# Patient Record
Sex: Male | Born: 1950
Health system: Southern US, Community
[De-identification: ages and names within clinical notes are randomized; demographics above are authoritative.]

## PROBLEM LIST (undated history)

## (undated) ENCOUNTER — Encounter

## (undated) ENCOUNTER — Ambulatory Visit

## (undated) ENCOUNTER — Telehealth

## (undated) ENCOUNTER — Encounter: Attending: Gastroenterology | Primary: Gastroenterology

## (undated) ENCOUNTER — Ambulatory Visit: Payer: MEDICARE | Attending: Gastroenterology | Primary: Gastroenterology

## (undated) ENCOUNTER — Ambulatory Visit: Attending: Pharmacist | Primary: Pharmacist

## (undated) ENCOUNTER — Ambulatory Visit: Payer: Medicare (Managed Care)

## (undated) ENCOUNTER — Ambulatory Visit: Payer: MEDICARE | Attending: Surgery | Primary: Surgery

## (undated) ENCOUNTER — Ambulatory Visit
Payer: Medicare (Managed Care) | Attending: Student in an Organized Health Care Education/Training Program | Primary: Student in an Organized Health Care Education/Training Program

## (undated) ENCOUNTER — Encounter
Attending: Student in an Organized Health Care Education/Training Program | Primary: Student in an Organized Health Care Education/Training Program

## (undated) ENCOUNTER — Ambulatory Visit: Payer: MEDICARE

## (undated) ENCOUNTER — Ambulatory Visit: Attending: Medical | Primary: Medical

## (undated) DIAGNOSIS — I358 Other nonrheumatic aortic valve disorders: Secondary | ICD-10-CM

## (undated) DIAGNOSIS — I10 Essential (primary) hypertension: Secondary | ICD-10-CM

## (undated) DIAGNOSIS — R0600 Dyspnea, unspecified: Secondary | ICD-10-CM

## (undated) DIAGNOSIS — K5190 Ulcerative colitis, unspecified, without complications: Secondary | ICD-10-CM

## (undated) DIAGNOSIS — R0989 Other specified symptoms and signs involving the circulatory and respiratory systems: Secondary | ICD-10-CM

## (undated) DIAGNOSIS — L4050 Arthropathic psoriasis, unspecified: Secondary | ICD-10-CM

## (undated) DIAGNOSIS — E119 Type 2 diabetes mellitus without complications: Secondary | ICD-10-CM

## (undated) HISTORY — DX: Arthropathic psoriasis, unspecified: L40.50

## (undated) HISTORY — DX: Other specified symptoms and signs involving the circulatory and respiratory systems: R09.89

## (undated) HISTORY — DX: Essential (primary) hypertension: I10

## (undated) HISTORY — DX: Other nonrheumatic aortic valve disorders: I35.8

## (undated) HISTORY — DX: Type 2 diabetes mellitus without complications: E11.9

## (undated) HISTORY — DX: Dyspnea, unspecified: R06.00

## (undated) HISTORY — DX: Ulcerative colitis, unspecified, without complications: K51.90

## (undated) HISTORY — PX: KNEE ARTHROSCOPY: SUR90

## (undated) HISTORY — PX: SHOULDER ARTHROSCOPY: SHX128

## (undated) MED ORDER — CLONAZEPAM 0.5 MG TABLET: Freq: Two times a day (BID) | ORAL | 0 days | PRN

## (undated) MED ORDER — METHOTREXATE SODIUM 25 MG/ML INJECTION SOLUTION: 25 mg | mL | 0 refills | 84 days

## (undated) MED ORDER — PRESERVISION AREDS ORAL: ORAL | 0.00000 days

## (undated) MED ORDER — SIMPONI 100 MG/ML SUBCUTANEOUS PEN INJECTOR: 100 mg | mL | 11 refills | 28 days

## (undated) MED ORDER — UNABLE TO FIND: Freq: Every day | 0.00000 days

## (undated) MED ORDER — AMLODIPINE 10 MG TABLET: Freq: Every day | 0 days

## (undated) MED ORDER — TRAMADOL 50 MG TABLET: Freq: Four times a day (QID) | ORAL | 0 days | PRN

---

## 1990-05-11 HISTORY — PX: MELANOMA EXCISION: SHX5266

## 2010-06-01 ENCOUNTER — Encounter: Payer: Self-pay | Admitting: Orthopedic Surgery

## 2013-07-21 DIAGNOSIS — J3489 Other specified disorders of nose and nasal sinuses: Secondary | ICD-10-CM | POA: Diagnosis not present

## 2013-07-21 DIAGNOSIS — L408 Other psoriasis: Secondary | ICD-10-CM | POA: Diagnosis not present

## 2013-07-21 DIAGNOSIS — I10 Essential (primary) hypertension: Secondary | ICD-10-CM | POA: Diagnosis not present

## 2013-07-21 DIAGNOSIS — E785 Hyperlipidemia, unspecified: Secondary | ICD-10-CM | POA: Diagnosis not present

## 2013-07-21 DIAGNOSIS — E119 Type 2 diabetes mellitus without complications: Secondary | ICD-10-CM | POA: Diagnosis not present

## 2013-07-21 DIAGNOSIS — M25519 Pain in unspecified shoulder: Secondary | ICD-10-CM | POA: Diagnosis not present

## 2013-10-12 DIAGNOSIS — L4050 Arthropathic psoriasis, unspecified: Secondary | ICD-10-CM | POA: Diagnosis not present

## 2013-10-17 DIAGNOSIS — L4050 Arthropathic psoriasis, unspecified: Secondary | ICD-10-CM | POA: Diagnosis not present

## 2013-11-01 DIAGNOSIS — L4050 Arthropathic psoriasis, unspecified: Secondary | ICD-10-CM | POA: Diagnosis not present

## 2013-11-20 DIAGNOSIS — J329 Chronic sinusitis, unspecified: Secondary | ICD-10-CM | POA: Diagnosis not present

## 2013-11-27 DIAGNOSIS — L4050 Arthropathic psoriasis, unspecified: Secondary | ICD-10-CM | POA: Diagnosis not present

## 2014-01-11 DIAGNOSIS — L4050 Arthropathic psoriasis, unspecified: Secondary | ICD-10-CM | POA: Diagnosis not present

## 2014-04-18 DIAGNOSIS — L409 Psoriasis, unspecified: Secondary | ICD-10-CM | POA: Diagnosis not present

## 2014-04-18 DIAGNOSIS — L4059 Other psoriatic arthropathy: Secondary | ICD-10-CM | POA: Diagnosis not present

## 2014-07-16 DIAGNOSIS — L4059 Other psoriatic arthropathy: Secondary | ICD-10-CM | POA: Diagnosis not present

## 2014-07-16 DIAGNOSIS — L409 Psoriasis, unspecified: Secondary | ICD-10-CM | POA: Diagnosis not present

## 2015-02-27 DIAGNOSIS — M25562 Pain in left knee: Secondary | ICD-10-CM | POA: Diagnosis not present

## 2015-02-27 DIAGNOSIS — L4059 Other psoriatic arthropathy: Secondary | ICD-10-CM | POA: Diagnosis not present

## 2015-02-27 DIAGNOSIS — Z79899 Other long term (current) drug therapy: Secondary | ICD-10-CM | POA: Diagnosis not present

## 2015-02-27 DIAGNOSIS — L409 Psoriasis, unspecified: Secondary | ICD-10-CM | POA: Diagnosis not present

## 2015-03-11 DIAGNOSIS — L4050 Arthropathic psoriasis, unspecified: Secondary | ICD-10-CM | POA: Diagnosis not present

## 2015-03-11 DIAGNOSIS — M12842 Other specific arthropathies, not elsewhere classified, left hand: Secondary | ICD-10-CM | POA: Diagnosis not present

## 2015-03-11 DIAGNOSIS — M12841 Other specific arthropathies, not elsewhere classified, right hand: Secondary | ICD-10-CM | POA: Diagnosis not present

## 2015-03-11 DIAGNOSIS — L4059 Other psoriatic arthropathy: Secondary | ICD-10-CM | POA: Diagnosis not present

## 2015-03-11 DIAGNOSIS — M25562 Pain in left knee: Secondary | ICD-10-CM | POA: Diagnosis not present

## 2015-03-11 DIAGNOSIS — M549 Dorsalgia, unspecified: Secondary | ICD-10-CM | POA: Diagnosis not present

## 2015-03-11 DIAGNOSIS — Z79899 Other long term (current) drug therapy: Secondary | ICD-10-CM | POA: Diagnosis not present

## 2015-03-11 DIAGNOSIS — L409 Psoriasis, unspecified: Secondary | ICD-10-CM | POA: Diagnosis not present

## 2015-05-23 DIAGNOSIS — L409 Psoriasis, unspecified: Secondary | ICD-10-CM | POA: Diagnosis not present

## 2015-05-23 DIAGNOSIS — M170 Bilateral primary osteoarthritis of knee: Secondary | ICD-10-CM | POA: Diagnosis not present

## 2015-05-23 DIAGNOSIS — L4059 Other psoriatic arthropathy: Secondary | ICD-10-CM | POA: Diagnosis not present

## 2015-05-23 DIAGNOSIS — M25561 Pain in right knee: Secondary | ICD-10-CM | POA: Diagnosis not present

## 2015-06-10 DIAGNOSIS — M25551 Pain in right hip: Secondary | ICD-10-CM | POA: Diagnosis not present

## 2015-06-10 DIAGNOSIS — L409 Psoriasis, unspecified: Secondary | ICD-10-CM | POA: Diagnosis not present

## 2015-06-10 DIAGNOSIS — L4059 Other psoriatic arthropathy: Secondary | ICD-10-CM | POA: Diagnosis not present

## 2015-07-31 DIAGNOSIS — M1712 Unilateral primary osteoarthritis, left knee: Secondary | ICD-10-CM | POA: Diagnosis not present

## 2015-07-31 DIAGNOSIS — L4059 Other psoriatic arthropathy: Secondary | ICD-10-CM | POA: Diagnosis not present

## 2015-07-31 DIAGNOSIS — L409 Psoriasis, unspecified: Secondary | ICD-10-CM | POA: Diagnosis not present

## 2015-07-31 DIAGNOSIS — M25562 Pain in left knee: Secondary | ICD-10-CM | POA: Diagnosis not present

## 2015-08-18 DIAGNOSIS — N492 Inflammatory disorders of scrotum: Secondary | ICD-10-CM | POA: Diagnosis not present

## 2015-08-21 DIAGNOSIS — N492 Inflammatory disorders of scrotum: Secondary | ICD-10-CM | POA: Diagnosis not present

## 2015-08-21 DIAGNOSIS — L0390 Cellulitis, unspecified: Secondary | ICD-10-CM | POA: Diagnosis not present

## 2015-08-22 DIAGNOSIS — L4059 Other psoriatic arthropathy: Secondary | ICD-10-CM | POA: Diagnosis not present

## 2015-08-22 DIAGNOSIS — M1712 Unilateral primary osteoarthritis, left knee: Secondary | ICD-10-CM | POA: Diagnosis not present

## 2015-08-22 DIAGNOSIS — L409 Psoriasis, unspecified: Secondary | ICD-10-CM | POA: Diagnosis not present

## 2015-08-22 DIAGNOSIS — M25562 Pain in left knee: Secondary | ICD-10-CM | POA: Diagnosis not present

## 2015-10-30 DIAGNOSIS — L409 Psoriasis, unspecified: Secondary | ICD-10-CM | POA: Diagnosis not present

## 2015-10-30 DIAGNOSIS — Z79899 Other long term (current) drug therapy: Secondary | ICD-10-CM | POA: Diagnosis not present

## 2015-10-30 DIAGNOSIS — L4059 Other psoriatic arthropathy: Secondary | ICD-10-CM | POA: Diagnosis not present

## 2015-10-30 DIAGNOSIS — M1712 Unilateral primary osteoarthritis, left knee: Secondary | ICD-10-CM | POA: Diagnosis not present

## 2015-10-30 DIAGNOSIS — M25562 Pain in left knee: Secondary | ICD-10-CM | POA: Diagnosis not present

## 2015-11-07 ENCOUNTER — Encounter: Payer: Self-pay | Admitting: Hematology

## 2015-11-07 ENCOUNTER — Telehealth: Payer: Self-pay | Admitting: Hematology

## 2015-11-07 NOTE — Telephone Encounter (Signed)
Appointment scheduled with Dr. Irene Limbo on 7/11 at 11:00am. Aware to arrive 30 minutes early. Demographics verified and location given. Letter to patient and the referring.

## 2015-11-19 ENCOUNTER — Encounter: Payer: Self-pay | Admitting: Hematology

## 2015-11-19 ENCOUNTER — Ambulatory Visit (HOSPITAL_BASED_OUTPATIENT_CLINIC_OR_DEPARTMENT_OTHER): Payer: Medicare Other

## 2015-11-19 ENCOUNTER — Ambulatory Visit (HOSPITAL_BASED_OUTPATIENT_CLINIC_OR_DEPARTMENT_OTHER): Payer: Medicare Other | Admitting: Hematology

## 2015-11-19 VITALS — BP 160/90 | HR 86 | Temp 98.4°F | Resp 19 | Ht 67.0 in | Wt 217.7 lb

## 2015-11-19 DIAGNOSIS — D892 Hypergammaglobulinemia, unspecified: Secondary | ICD-10-CM

## 2015-11-19 DIAGNOSIS — L4050 Arthropathic psoriasis, unspecified: Secondary | ICD-10-CM | POA: Insufficient documentation

## 2015-11-19 DIAGNOSIS — L409 Psoriasis, unspecified: Secondary | ICD-10-CM | POA: Insufficient documentation

## 2015-11-19 DIAGNOSIS — D45 Polycythemia vera: Secondary | ICD-10-CM | POA: Diagnosis not present

## 2015-11-19 LAB — COMPREHENSIVE METABOLIC PANEL
ALT: 39 U/L (ref 0–55)
ANION GAP: 11 meq/L (ref 3–11)
AST: 29 U/L (ref 5–34)
Albumin: 3.7 g/dL (ref 3.5–5.0)
Alkaline Phosphatase: 67 U/L (ref 40–150)
BILIRUBIN TOTAL: 0.48 mg/dL (ref 0.20–1.20)
BUN: 10.3 mg/dL (ref 7.0–26.0)
CO2: 23 meq/L (ref 22–29)
Calcium: 9.4 mg/dL (ref 8.4–10.4)
Chloride: 103 mEq/L (ref 98–109)
Creatinine: 0.8 mg/dL (ref 0.7–1.3)
Glucose: 202 mg/dl — ABNORMAL HIGH (ref 70–140)
POTASSIUM: 3.8 meq/L (ref 3.5–5.1)
Sodium: 137 mEq/L (ref 136–145)
TOTAL PROTEIN: 8 g/dL (ref 6.4–8.3)

## 2015-11-19 LAB — CBC & DIFF AND RETIC
BASO%: 0.5 % (ref 0.0–2.0)
Basophils Absolute: 0.1 10*3/uL (ref 0.0–0.1)
EOS%: 3.5 % (ref 0.0–7.0)
Eosinophils Absolute: 0.3 10*3/uL (ref 0.0–0.5)
HCT: 44.7 % (ref 38.4–49.9)
HGB: 15.8 g/dL (ref 13.0–17.1)
IMMATURE RETIC FRACT: 5 % (ref 3.00–10.60)
LYMPH%: 20.3 % (ref 14.0–49.0)
MCH: 28.9 pg (ref 27.2–33.4)
MCHC: 35.3 g/dL (ref 32.0–36.0)
MCV: 81.9 fL (ref 79.3–98.0)
MONO#: 0.9 10*3/uL (ref 0.1–0.9)
MONO%: 9.9 % (ref 0.0–14.0)
NEUT%: 65.8 % (ref 39.0–75.0)
NEUTROS ABS: 6 10*3/uL (ref 1.5–6.5)
NRBC: 0 % (ref 0–0)
PLATELETS: 189 10*3/uL (ref 140–400)
RBC: 5.46 10*6/uL (ref 4.20–5.82)
RDW: 13.5 % (ref 11.0–14.6)
Retic %: 1.28 % (ref 0.80–1.80)
Retic Ct Abs: 69.89 10*3/uL (ref 34.80–93.90)
WBC: 9.2 10*3/uL (ref 4.0–10.3)
lymph#: 1.9 10*3/uL (ref 0.9–3.3)

## 2015-11-19 NOTE — Progress Notes (Signed)
Benjamin Chapman Kitchen    HEMATOLOGY/ONCOLOGY CONSULTATION NOTE  Date of Service: 11/19/2015  PCP: Dr Benjamin Chapman (Benjamin Chapman)  CHIEF COMPLAINTS/PURPOSE OF CONSULTATION:  Elevated red blood cells  HISTORY OF PRESENTING ILLNESS:   Benjamin Chapman is a wonderful 65 y.o. male who has been referred to [place] by Dr .Benjamin Chapman ([place]) for evaluation and management of elevated red blood cells.  Patient has a history of long-standing psoriasis with psoriatic arthritis, diabetes type 2, remote history of melanoma, clinical concern for sleep apnea, obesity who sees Dr. Dossie Chapman for management of his psoriatic polyarthritis. he reports that he has been on Remicade and then Humira and then [place] and now is on Leflunomide his sedimentation rate was noted to be within normal limits recently. She was noted on recent labs with his rheumatologist on 10/31/2015 to have a hemoglobin of 17 and a hematocrit of 50.3 with a normal WBC count of 10.1k and normal platelet count of 271k. Comments metabolic panel on the same day showed elevated blood sugar of 222. His CBC on 03/10/2016 prior to his recent labs showed hemoglobin of 16 with hematocrit of 47.6 normal platelet count and close to normal WBC count Patient reports that he has never had this before. No previous history of venous thromboembolism. No fevers no chills no night sweats no weight loss. No significant new fatigue. No abdominal pain or distention.    MEDICAL HISTORY:   #1 long-standing psoriasis with psoriatic arthropathy for 10 years #2 diabetes type 2-diet-controlled #3 obesity #4 clinical concern for sleep apnea #5 history of melanoma on the back 25 years ago status post surgical resection #6 bilateral biceps tendon rupture surgery  SURGICAL HISTORY: Bilateral bicipital tendon rupture surgery  SOCIAL HISTORY: Social History   Social History  . Marital Status: Married    Spouse Name: N/A  . Number of Children: N/A    . Years of Education: N/A   Occupational History  . Not on file.   Social History Main Topics  . Smoking status: Not on file  . Smokeless tobacco: Not on file  . Alcohol Use: Not on file  . Drug Use: Not on file  . Sexual Activity: Not on file   Other Topics Concern  . Not on file   Social History Narrative  . No narrative on file   Ex-smoker and smoked 1 pack per day starting at age 72 years quit 20 years ago. Okays no social alcohol use. 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  FAMILY HISTORY:  -Father diseased at age 66 years from drowning when the patient was about 72 months old. -Mother history of CVA  ALLERGIES:  is allergic to codeine and remicade [infliximab].  MEDICATIONS:  Current Outpatient Prescriptions  Medication Sig Dispense Refill  . aspirin 81 MG tablet Take 81 mg by mouth daily.    Benjamin Chapman Kitchen ibuprofen (ADVIL,MOTRIN) 200 MG tablet Take 200 mg by mouth every 6 (six) hours as needed.     No current facility-administered medications for this visit.   Leflunomide 10 mg by mouth daily started recently  REVIEW OF SYSTEMS:    10 Point review of Systems was done is negative except as noted above.  PHYSICAL EXAMINATION: ECOG PERFORMANCE STATUS: 1 - Symptomatic but completely ambulatory  . Filed Vitals:   11/19/15 1043 11/19/15 1047  BP: 164/91 160/90  Pulse: 86   Temp: 98.4 F (36.9 C)   Resp: 19    Filed Weights  11/19/15 1043  Weight: 217 lb 11.2 oz (98.748 kg)   .Body mass index is 34.09 kg/(m^2).  GENERAL:alert, in no acute distress and comfortable SKIN: chronic psoariasis, no acute rashes. EYES: normal, conjunctiva are pink and non-injected, sclera clear OROPHARYNX:no exudate, no erythema and lips, buccal mucosa, and tongue normal  NECK: supple, no JVD, thyroid normal size, non-tender, without nodularity LYMPH:  no palpable lymphadenopathy in the cervical, axillary or inguinal LUNGS:  clear to auscultation with normal respiratory effort HEART: regular rate & rhythm,  no murmurs and no lower extremity edema ABDOMEN: abdomen soft, non-tender, normoactive bowel sounds , no palpable hepatosplenomegaly Musculoskeletal: no cyanosis of digits and no clubbing  PSYCH: alert & oriented x 3 with fluent speech NEURO: no focal motor/sensory deficits  LABORATORY DATA:  I have reviewed the data as listed  Outside labs from Dr. Audelia Hives office  CBC shows a hemoglobin of 17 hematocrit of 50.3 RBC count 10.1k platelets of 271k CMP showed sodium 133, potassium 4.5, chloride 97, bicarbonate 27, BUN 11, creatinine 0.8, glucose of 222, total protein 8.4, albumin 4 normal LFTs. Sedimentation rate of 9  RADIOGRAPHIC STUDIES: I have personally reviewed the radiological images as listed and agreed with the findings in the report. No results found.  ASSESSMENT & PLAN:   65 year old  male with  #1 Mild polycythemia noted by his rheumatologist on labs on 10/31/2015. Hemoglobin was noted to be 17 with hematocrit of 50.3. No associated thrombocytosis or leukocytosis. Patient has no palpable hepatosplenomegaly. This is more likely to be a secondary polycythemia possibly related to hemoconcentration from osmotic diuresis related to uncontrolled diabetes. He also has clinical symptomatology suggestive of possible sleep apnea in the setting of obesity. Ex-smoker but has quit smoking 20 years ago. Not on diuretics at this time. No previous history of venous thromboembolism. Plan -We shall recheck his CBC with differential and CMP today -We shall rule out clonal erythropoiesis by getting a Jak 2 V617F and Jak2  Exon 12 studies. -If it is clonal erythropoiesis genetic markers are negative that would pretty much rule out polycythemia vera and the patient would need to follow-up with his primary care physician to evaluate other etiologies of secondary polycythemia . Would need to have his diabetes under  better control avoid osmotic diuresis with hemoconcentration .  -Would recommend getting a sleep study with his primary care physician to rule out obstructive sleep apnea .  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 Irene Limbo   All of the patients questions were answered with apparent satisfaction. The patient knows to call the clinic with any problems, questions or concerns.  I spent 45 minutes counseling the patient face to face. The total time spent in the appointment was 60 minutes and more than 50% was on counseling and direct patient cares.    Sullivan Lone MD [place] AAHIVMS [facility] Hematology/Oncology Physician [facility]  (Office):       [phone] (Work cell):  [phone] (Fax):           [phone]  11/19/2015 11:28 AM

## 2015-11-21 LAB — MULTIPLE MYELOMA PANEL, SERUM
ALBUMIN/GLOB SERPL: 1 (ref 0.7–1.7)
ALPHA 1: 0.3 g/dL (ref 0.0–0.4)
ALPHA2 GLOB SERPL ELPH-MCNC: 0.9 g/dL (ref 0.4–1.0)
Albumin SerPl Elph-Mcnc: 3.6 g/dL (ref 2.9–4.4)
B-Globulin SerPl Elph-Mcnc: 1.4 g/dL — ABNORMAL HIGH (ref 0.7–1.3)
GAMMA GLOB SERPL ELPH-MCNC: 1.2 g/dL (ref 0.4–1.8)
GLOBULIN, TOTAL: 3.8 g/dL (ref 2.2–3.9)
IgA, Qn, Serum: 416 mg/dL (ref 61–437)
IgM, Qn, Serum: 129 mg/dL (ref 20–172)
Total Protein: 7.4 g/dL (ref 6.0–8.5)

## 2015-12-04 ENCOUNTER — Telehealth: Payer: Self-pay | Admitting: Hematology

## 2015-12-04 NOTE — Telephone Encounter (Signed)
Called patient to confirm appointment. Left message. Appointment letter and schedule mailed. Maria F. °

## 2015-12-23 ENCOUNTER — Other Ambulatory Visit: Payer: Medicare Other

## 2015-12-23 ENCOUNTER — Ambulatory Visit: Payer: Medicare Other | Admitting: Hematology

## 2015-12-31 ENCOUNTER — Telehealth: Payer: Self-pay | Admitting: Hematology

## 2015-12-31 NOTE — Telephone Encounter (Signed)
DR Irene Limbo COVERING AP 8/23. PER INF MGR RESCHEDULED F/U. SPOKE WITH [name] LAB/FU CHANGE FROM 8/23 TO 8/28 @ 3 PM.

## 2016-01-01 ENCOUNTER — Ambulatory Visit: Payer: Medicare Other | Admitting: Hematology

## 2016-01-01 ENCOUNTER — Other Ambulatory Visit: Payer: Medicare Other

## 2016-01-06 ENCOUNTER — Other Ambulatory Visit: Payer: Medicare Other

## 2016-01-06 ENCOUNTER — Encounter: Payer: Self-pay | Admitting: Hematology

## 2016-01-06 ENCOUNTER — Ambulatory Visit (HOSPITAL_BASED_OUTPATIENT_CLINIC_OR_DEPARTMENT_OTHER): Payer: Medicare Other | Admitting: Hematology

## 2016-01-06 VITALS — BP 176/84 | HR 65 | Temp 98.8°F | Resp 18 | Wt 214.3 lb

## 2016-01-06 DIAGNOSIS — D892 Hypergammaglobulinemia, unspecified: Secondary | ICD-10-CM | POA: Diagnosis not present

## 2016-01-06 DIAGNOSIS — D751 Secondary polycythemia: Secondary | ICD-10-CM | POA: Diagnosis not present

## 2016-02-10 NOTE — Progress Notes (Signed)
[name] Kitchen    HEMATOLOGY/ONCOLOGY CLINIC NOTE  Date of Service: .01/06/2016  PCP: Dr Syed-rheumatology ([place])  CHIEF COMPLAINTS/PURPOSE OF CONSULTATION:  Elevated red blood cells  HISTORY OF PRESENTING ILLNESS:   Benjamin Chapman is a wonderful 65 y.o. male who has been referred to [place] by Dr .Dossie Der ([place]) for evaluation and management of elevated red blood cells.  Patient has a history of long-standing psoriasis with psoriatic arthritis, diabetes type 2, remote history of melanoma, clinical concern for sleep apnea, obesity who sees Dr. Dossie Der for management of his psoriatic polyarthritis. he reports that he has been on Remicade and then Humira and then [place] and now is on Leflunomide his sedimentation rate was noted to be within normal limits recently. She was noted on recent labs with his rheumatologist on 10/31/2015 to have a hemoglobin of 17 and a hematocrit of 50.3 with a normal WBC count of 10.1k and normal platelet count of 271k. Comments metabolic panel on the same day showed elevated blood sugar of 222. His CBC on 03/10/2016 prior to his recent labs showed hemoglobin of 16 with hematocrit of 47.6 normal platelet count and close to normal WBC count Patient reports that he has never had this before. No previous history of venous thromboembolism. No fevers no chills no night sweats no weight loss. No significant new fatigue. No abdominal pain or distention.   INTERVAL HISTORY  Benjamin Chapman is here for followup of his lab results as a part of workup for polycythemia and hypergammaglobulinemia. He notes no acute new symptoms.   MEDICAL HISTORY:   #1 long-standing psoriasis with psoriatic arthropathy for 10 years #2 diabetes type 2-diet-controlled #3 obesity #4 clinical concern for sleep apnea #5 history of melanoma on the back 25 years ago status post surgical resection #6 bilateral biceps tendon rupture surgery  SURGICAL  HISTORY: Bilateral bicipital tendon rupture surgery  SOCIAL HISTORY: Social History   Social History  . Marital status: Married    Spouse name: N/A  . Number of children: N/A  . Years of education: N/A   Occupational History  . Not on file.   Social History Main Topics  . Smoking status: Former Research scientist (life sciences)  . Smokeless tobacco: Never Used  . Alcohol use Yes     Comment: occasional  . Drug use: Unknown  . Sexual activity: No   Other Topics Concern  . Not on file   Social History Narrative  . No narrative on file   Ex-smoker and smoked 1 pack per day starting at age 42 years quit 20 years ago. Okays no social alcohol use. 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  FAMILY HISTORY:  -Father diseased at age 53 years from drowning when the patient was about 31 months old. -Mother history of CVA  ALLERGIES:  is allergic to codeine and remicade [infliximab].  MEDICATIONS:  Current Outpatient Prescriptions  Medication Sig Dispense Refill  . aspirin 81 MG tablet Take 81 mg by mouth daily.    [name] Kitchen ibuprofen (ADVIL,MOTRIN) 200 MG tablet Take 200 mg by mouth every 6 (six) hours as needed.     No current facility-administered medications for this visit.   Leflunomide 10 mg by mouth daily started recently  REVIEW OF SYSTEMS:    10 Point review of Systems was done is negative except as noted above.  PHYSICAL EXAMINATION: ECOG PERFORMANCE STATUS: 1 - Symptomatic but completely ambulatory  . Vitals:   01/06/16 1500  BP: (!) 176/84  Pulse: 65  Resp: 18  Temp: 98.8 F (37.1 C)   Filed Weights   01/06/16 1500  Weight: 214 lb 4.8 oz (97.2 kg)   .Body mass index is 33.56 kg/m.  GENERAL:alert, in no acute distress and comfortable SKIN: chronic psoariasis, no acute rashes. EYES: normal, conjunctiva are pink and non-injected, sclera clear OROPHARYNX:no exudate, no erythema and lips, buccal mucosa, and tongue normal   NECK: supple, no JVD, thyroid normal size, non-tender, without nodularity LYMPH:  no palpable lymphadenopathy in the cervical, axillary or inguinal LUNGS: clear to auscultation with normal respiratory effort HEART: regular rate & rhythm,  no murmurs and no lower extremity edema ABDOMEN: abdomen soft, non-tender, normoactive bowel sounds , no palpable hepatosplenomegaly Musculoskeletal: no cyanosis of digits and no clubbing  PSYCH: alert & oriented x 3 with fluent speech NEURO: no focal motor/sensory deficits  LABORATORY DATA:  I have reviewed the data as listed  . CBC Latest Ref Rng & Units 11/19/2015  WBC 4.0 - 10.3 10e3/uL 9.2  Hemoglobin 13.0 - 17.1 g/dL 15.8  Hematocrit 38.4 - 49.9 % 44.7  Platelets 140 - 400 10e3/uL 189    . CMP Latest Ref Rng & Units 11/19/2015 11/19/2015  Glucose 70 - 140 mg/dl 202(H) -  BUN 7.0 - 26.0 mg/dL 10.3 -  Creatinine 0.7 - 1.3 mg/dL 0.8 -  Sodium 136 - 145 mEq/L 137 -  Potassium 3.5 - 5.1 mEq/L 3.8 -  CO2 22 - 29 mEq/L 23 -  Calcium 8.4 - 10.4 mg/dL 9.4 -  Total Protein 6.0 - 8.5 g/dL 8.0 7.4  Total Bilirubin 0.20 - 1.20 mg/dL 0.48 -  Alkaline Phos 40 - 150 U/L 67 -  AST 5 - 34 U/L 29 -  ALT 0 - 55 U/L 39 -          Patient  Outside labs from Dr. Audelia Hives office  CBC shows a hemoglobin of 17 hematocrit of 50.3 RBC count 10.1k platelets of 271k CMP showed sodium 133, potassium 4.5, chloride 97, bicarbonate 27, BUN 11, creatinine 0.8, glucose of 222, total protein 8.4, albumin 4 normal LFTs. Sedimentation rate of 9  RADIOGRAPHIC STUDIES: I have personally reviewed the radiological images as listed and agreed with the findings in the report. No results found.  ASSESSMENT & PLAN:   65 year old  male with  #1 Mild polycythemia noted by his rheumatologist on labs on 10/31/2015. Hemoglobin was noted to be 17 with hematocrit of 50.3. No associated thrombocytosis or leukocytosis. Patient has no palpable hepatosplenomegaly. This is more  likely to be a secondary polycythemia possibly related to hemoconcentration from osmotic diuresis related to uncontrolled diabetes. He also has clinical symptomatology suggestive of possible sleep apnea in the setting of obesity. Jak2 V617F and Jak2 Exon 12 mutation negative which rules out polycythemia vera with [id number] certainty. Ex-smoker but has quit smoking 20 years ago. Not on diuretics at this time. No previous history of venous thromboembolism.  His labs done in our clinic showed resolution of his polycythemia with hemoglobin of 15.8 and hematocrit of 44.7. Plan -follow-up with his primary care physician to evaluate other etiologies of secondary polycythemia . Would need to have his diabetes under better control avoid osmotic diuresis with hemoconcentration .  -Would recommend getting a sleep study with his primary care physician to rule out obstructive sleep apnea .  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  Continue follow-up with primary care physician.  All of the patients questions were answered with apparent satisfaction. The patient knows to call the clinic with any problems, questions or concerns.  I spent 20 minutes counseling the patient face to face. The total time spent in the appointment was 20 minutes and more than 50% was on counseling and direct patient cares.    Sullivan Lone MD [place] AAHIVMS [facility] Hematology/Oncology Physician [facility]  (Office):       [phone] (Work cell):  [phone] (Fax):           [phone]

## 2016-03-18 DIAGNOSIS — E1165 Type 2 diabetes mellitus with hyperglycemia: Secondary | ICD-10-CM | POA: Diagnosis not present

## 2016-03-18 DIAGNOSIS — L4059 Other psoriatic arthropathy: Secondary | ICD-10-CM | POA: Diagnosis not present

## 2016-03-18 DIAGNOSIS — R5383 Other fatigue: Secondary | ICD-10-CM | POA: Diagnosis not present

## 2016-03-18 DIAGNOSIS — M5416 Radiculopathy, lumbar region: Secondary | ICD-10-CM | POA: Diagnosis not present

## 2016-03-18 DIAGNOSIS — L409 Psoriasis, unspecified: Secondary | ICD-10-CM | POA: Diagnosis not present

## 2016-03-18 DIAGNOSIS — R05 Cough: Secondary | ICD-10-CM | POA: Diagnosis not present

## 2016-04-08 DIAGNOSIS — Z1211 Encounter for screening for malignant neoplasm of colon: Secondary | ICD-10-CM | POA: Diagnosis not present

## 2016-04-15 DIAGNOSIS — E1165 Type 2 diabetes mellitus with hyperglycemia: Secondary | ICD-10-CM | POA: Diagnosis not present

## 2016-04-15 DIAGNOSIS — E559 Vitamin D deficiency, unspecified: Secondary | ICD-10-CM | POA: Diagnosis not present

## 2016-04-15 DIAGNOSIS — I10 Essential (primary) hypertension: Secondary | ICD-10-CM | POA: Diagnosis not present

## 2016-04-15 DIAGNOSIS — Z0001 Encounter for general adult medical examination with abnormal findings: Secondary | ICD-10-CM | POA: Diagnosis not present

## 2016-04-15 DIAGNOSIS — Z125 Encounter for screening for malignant neoplasm of prostate: Secondary | ICD-10-CM | POA: Diagnosis not present

## 2016-04-15 DIAGNOSIS — L409 Psoriasis, unspecified: Secondary | ICD-10-CM | POA: Diagnosis not present

## 2016-04-15 DIAGNOSIS — Z0000 Encounter for general adult medical examination without abnormal findings: Secondary | ICD-10-CM | POA: Diagnosis not present

## 2016-04-15 DIAGNOSIS — L4059 Other psoriatic arthropathy: Secondary | ICD-10-CM | POA: Diagnosis not present

## 2016-04-15 DIAGNOSIS — Z85820 Personal history of malignant melanoma of skin: Secondary | ICD-10-CM | POA: Diagnosis not present

## 2016-06-12 DIAGNOSIS — I10 Essential (primary) hypertension: Secondary | ICD-10-CM | POA: Diagnosis not present

## 2016-06-12 DIAGNOSIS — R109 Unspecified abdominal pain: Secondary | ICD-10-CM | POA: Diagnosis not present

## 2016-06-12 DIAGNOSIS — E1165 Type 2 diabetes mellitus with hyperglycemia: Secondary | ICD-10-CM | POA: Diagnosis not present

## 2016-06-12 DIAGNOSIS — R5383 Other fatigue: Secondary | ICD-10-CM | POA: Diagnosis not present

## 2016-06-15 DIAGNOSIS — E1165 Type 2 diabetes mellitus with hyperglycemia: Secondary | ICD-10-CM | POA: Diagnosis not present

## 2016-06-15 DIAGNOSIS — R195 Other fecal abnormalities: Secondary | ICD-10-CM | POA: Diagnosis not present

## 2016-06-15 DIAGNOSIS — R197 Diarrhea, unspecified: Secondary | ICD-10-CM | POA: Diagnosis not present

## 2016-06-15 DIAGNOSIS — R5383 Other fatigue: Secondary | ICD-10-CM | POA: Diagnosis not present

## 2016-06-15 DIAGNOSIS — E0821 Diabetes mellitus due to underlying condition with diabetic nephropathy: Secondary | ICD-10-CM | POA: Diagnosis not present

## 2016-06-18 ENCOUNTER — Other Ambulatory Visit: Payer: Self-pay | Admitting: Gastroenterology

## 2016-06-18 DIAGNOSIS — K5900 Constipation, unspecified: Secondary | ICD-10-CM | POA: Diagnosis not present

## 2016-06-18 DIAGNOSIS — R194 Change in bowel habit: Secondary | ICD-10-CM

## 2016-06-18 DIAGNOSIS — R1084 Generalized abdominal pain: Secondary | ICD-10-CM

## 2016-06-18 DIAGNOSIS — R195 Other fecal abnormalities: Secondary | ICD-10-CM | POA: Diagnosis not present

## 2016-06-22 ENCOUNTER — Ambulatory Visit
Admission: RE | Admit: 2016-06-22 | Discharge: 2016-06-22 | Disposition: A | Discharge status: Home / Self Care | Payer: Medicare Other | Source: Ambulatory Visit | Attending: Gastroenterology | Admitting: Gastroenterology

## 2016-06-22 DIAGNOSIS — R1084 Generalized abdominal pain: Secondary | ICD-10-CM | POA: Diagnosis not present

## 2016-06-22 DIAGNOSIS — R194 Change in bowel habit: Secondary | ICD-10-CM

## 2016-06-22 IMAGING — CT CT ABD-PELV W/ CM
3 of 5 series · 11 of 36 positions shown, 17 images · IV contrast (READICAT/WATER & [ID] ISOVUE 300)
Comparison: None.

CLINICAL DATA: 65-year-old male with generalized abdominal pain for
the past month and a half. Diarrhea. 10-15 pound weight loss over
the past month. History of diverticulitis. Melanoma. Initial
encounter.

EXAM:
CT ABDOMEN AND PELVIS WITH CONTRAST
TECHNIQUE: Multidetector CT imaging of the abdomen and pelvis was performed
using the standard protocol following bolus administration of
intravenous contrast.
CONTRAST:  100mL [JZ] IOPAMIDOL ([JZ]) INJECTION 61%

[Series 3: abd/pelvis with · axial · 0.86mm/px · z∈[-374,-44]mm · 7 of 89 slices shown, 12 images]
[im 12/89  soft-tissue]
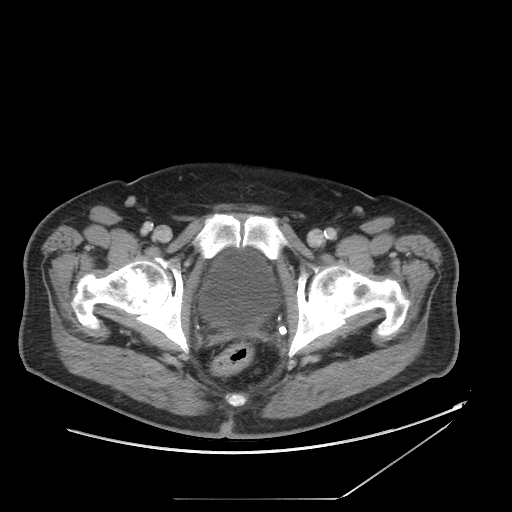
[im 12/89  bone]
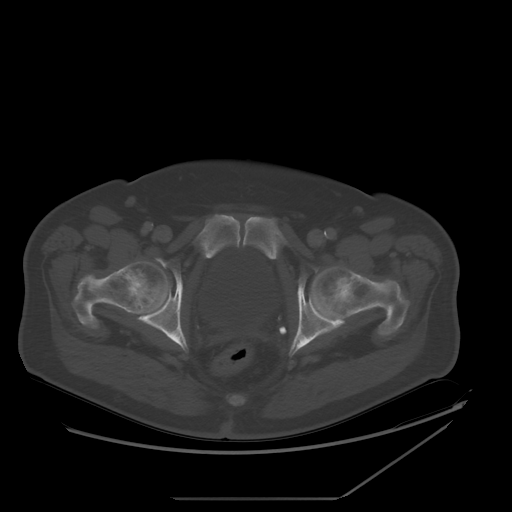
[im 23/89  soft-tissue]
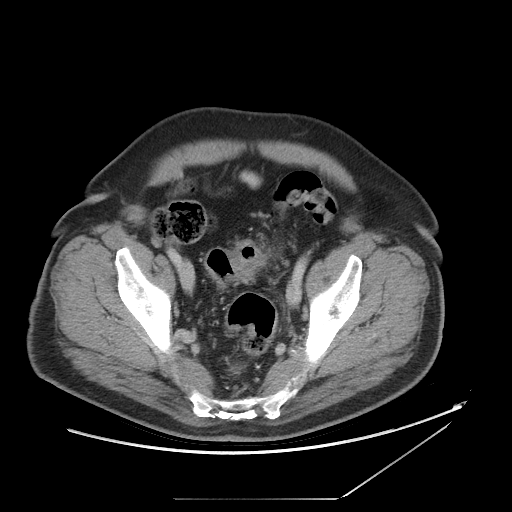
[im 34/89  soft-tissue]
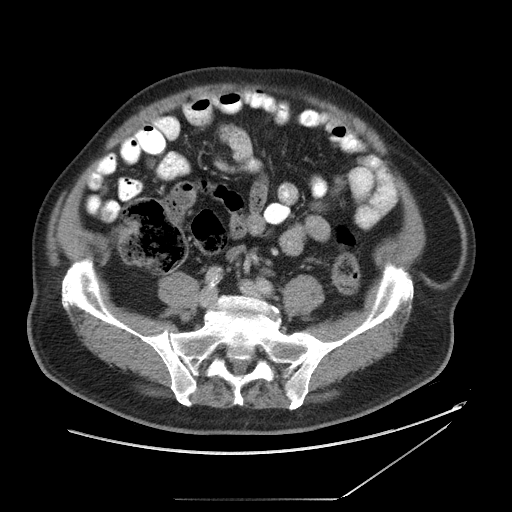
[im 45/89  soft-tissue]
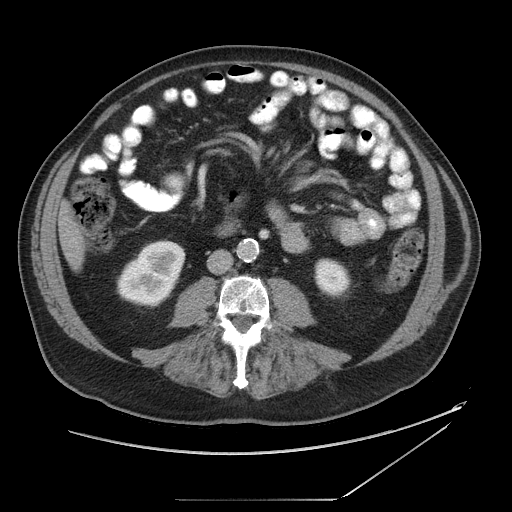
[im 45/89  lung]
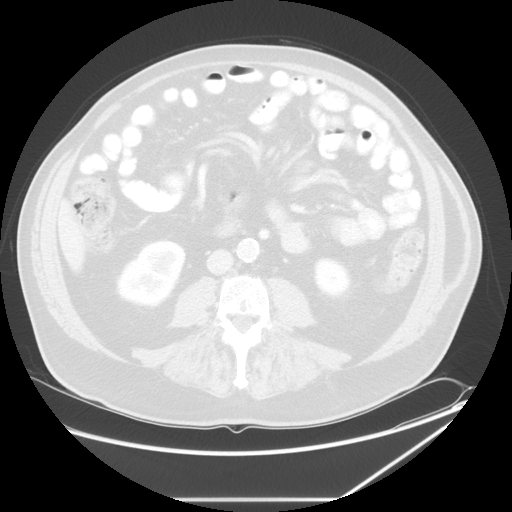
[im 56/89  soft-tissue]
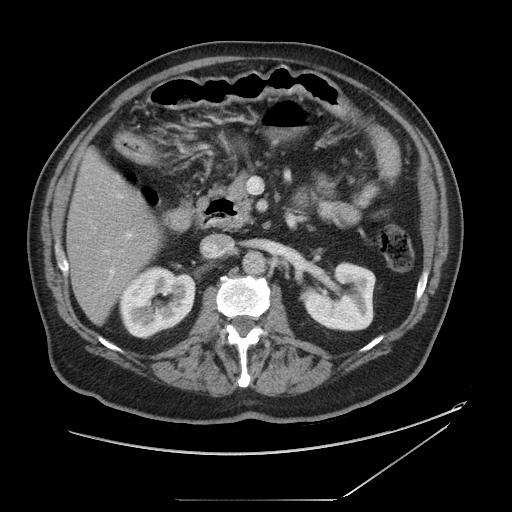
[im 56/89  lung]
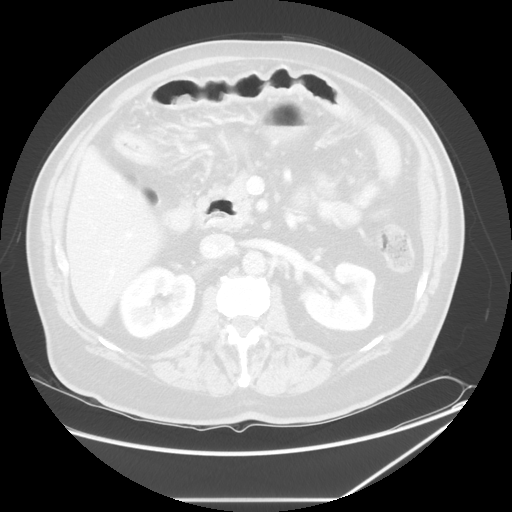
[im 67/89  soft-tissue]
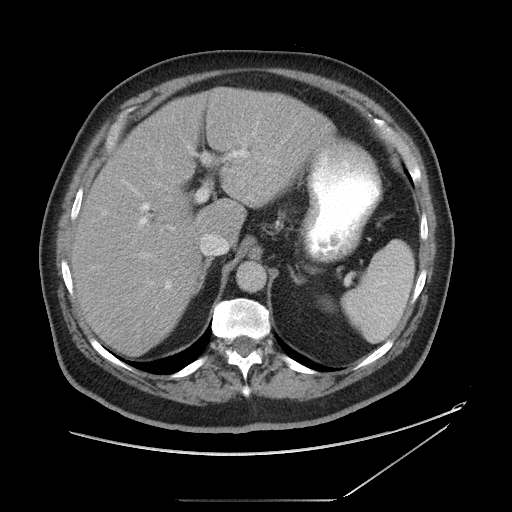
[im 67/89  lung]
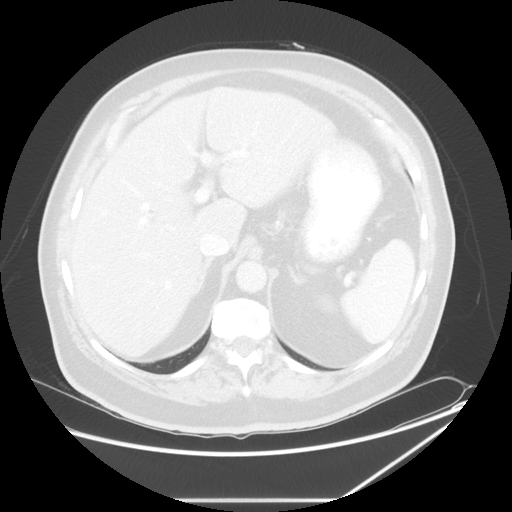
[im 78/89  soft-tissue]
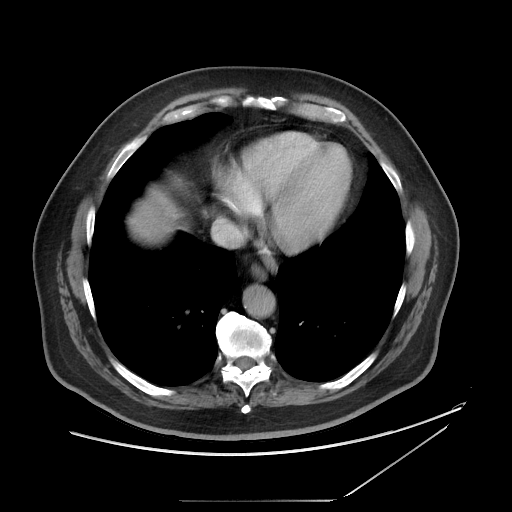
[im 78/89  lung]
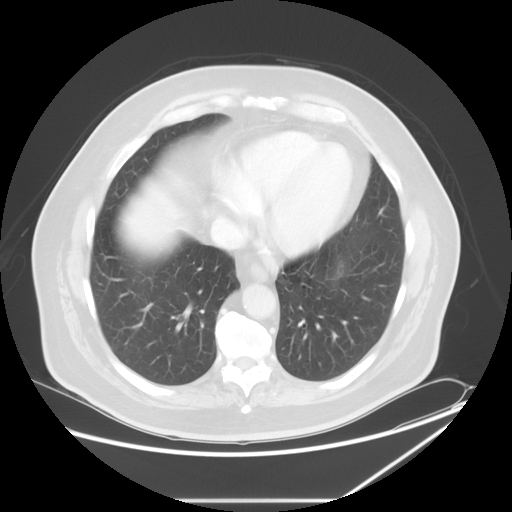

[Series 601: coronal body · coronal · 0.86mm/px · 1 of 141 slices shown, 2 images]
[im 47/141  soft-tissue]
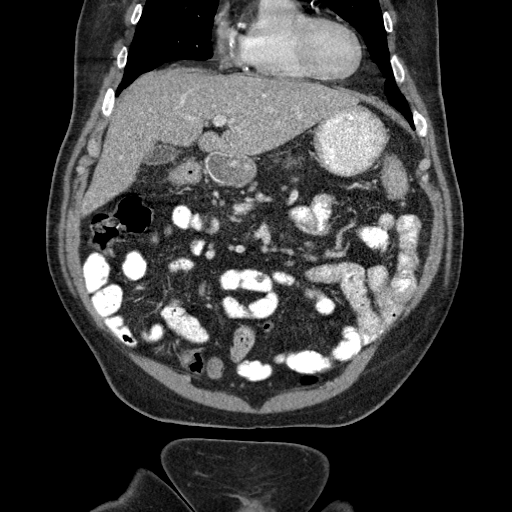
[im 47/141  bone]
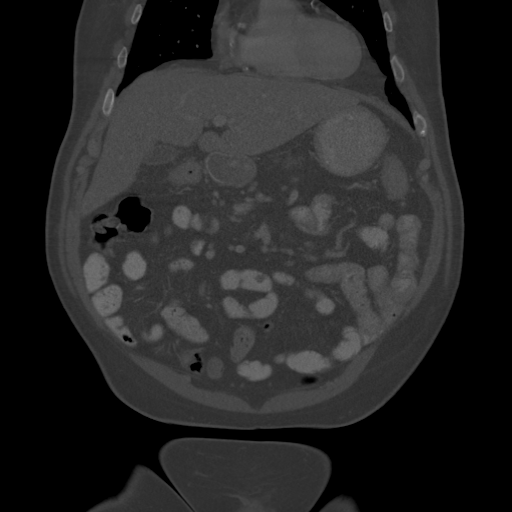

[Series 602: sagittal body · sagittal · 0.86mm/px · 3 of 163 slices shown]
[im 11/163  soft-tissue]
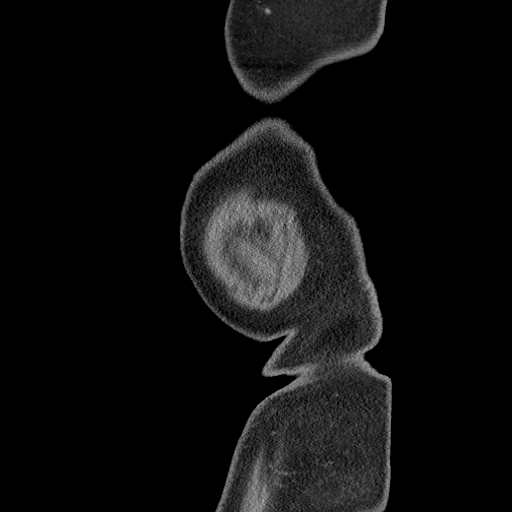
[im 31/163  soft-tissue]
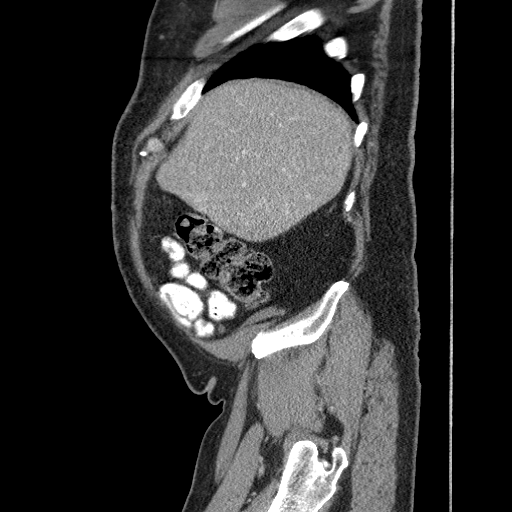
[im 51/163  soft-tissue]
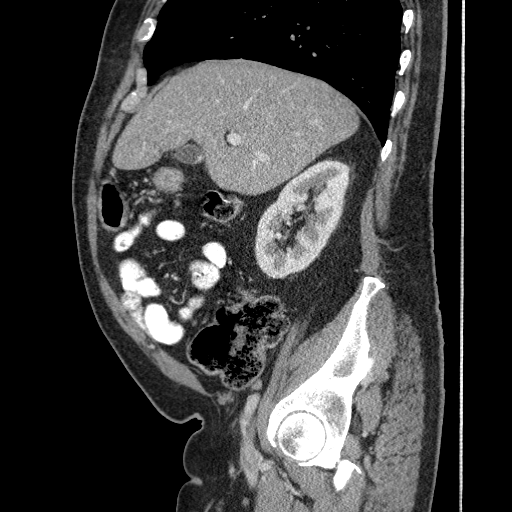

[11 of 36 positions shown; findings below may reference images not displayed]

FINDINGS: Lower chest: Minimal nodularity lung bases. Largest posterior right
lower lobe measuring 3.8 mm (series 4, image 18).

Coronary artery calcifications. Heart size within normal limits.
Aortic valve calcifications.

Hepatobiliary: Fatty liver without focal worrisome mass. Tiny
gallstones.

Pancreas: Prominent duodenal diverticulum near pancreatic head
without pancreatic mass or inflammation noted.

Spleen: No mass or enlargement.

Adrenals/Urinary Tract: No renal or ureteral obstructing stone or
hydronephrosis. No worrisome renal or adrenal mass.

Stomach/Bowel: Abnormal appearance of the mid sigmoid colon.
Although it is possible this represents changes of diverticulitis
with intramural small abscess and minimal surrounding haziness of
fat planes, the additional finding of surrounding increased number
of normal size and slightly enlarged lymph nodes suggest underlying
malignancy cannot be excluded.

Proximal and distal transverse colon underdistended with thickened
walls. Although it is possible this is related to underdistention,
changes of underlying colitis could cause a similar appearance.

Prominent duodenal diverticulum.

Vascular/Lymphatic: Atherosclerotic changes abdominal aorta and
iliac arteries without aneurysmal dilation.

Increased number of normal size to slightly prominent size
mesenteric lymph nodes largest adjacent to the sigmoid colon as
noted above.

Reproductive: Calcification prostate gland minimal lobular with
minimal upon the bladder base. Noncontrast filled imaging of the
urinary bladder unremarkable.

Other: No free intraperitoneal air or drainable abscess identified.

Musculoskeletal: Degenerative change at most notable L5-S1.
Confluent osteophyte lower thoracic spine. Subchondral cyst right
sacrum. Partial fusion left sacroiliac joint. Nonspecific 1 cm
lucency left ilium adjacent to the sacroiliac joint.
IMPRESSION: Abnormal appearance of the mid sigmoid colon. Although it is
possible this represents changes of diverticulitis with intramural
small abscess and minimal surrounding haziness of fat planes, the
additional finding of surrounding increased number of normal size
and slightly enlarged lymph nodes suggests underlying malignancy
cannot be excluded.

Proximal and distal transverse colon underdistended with thickened
walls. Although it is possible this is related to underdistention,
changes of underlying colitis could cause a similar appearance.

Tiny lung base nodules largest right lung base measuring 3.8 mm
(series 4 image [DATE] be incidental however, if the patient
sigmoid abnormality proves to be malignant or if the patient's
melanoma was high grade, follow-up chest CT recommended to determine
if there are other pulmonary nodules.

Nonspecific 1 cm lucency left ilium adjacent to the sacroiliac
joint.

Aortic atherosclerosis.

Coronary artery calcifications.

Tiny gallstones.

These results will be called to the ordering clinician or
representative by the Radiologist Assistant, and communication
documented in the PACS or zVision Dashboard.

## 2016-06-22 MED ORDER — IOPAMIDOL (ISOVUE-300) INJECTION 61%
100.0000 mL | Freq: Once | INTRAVENOUS | Status: AC | PRN
  Administered 2016-06-22: 100 mL via INTRAVENOUS

## 2016-06-30 DIAGNOSIS — K633 Ulcer of intestine: Secondary | ICD-10-CM | POA: Diagnosis not present

## 2016-06-30 DIAGNOSIS — K529 Noninfective gastroenteritis and colitis, unspecified: Secondary | ICD-10-CM | POA: Diagnosis not present

## 2016-06-30 DIAGNOSIS — K640 First degree hemorrhoids: Secondary | ICD-10-CM | POA: Diagnosis not present

## 2016-06-30 DIAGNOSIS — R195 Other fecal abnormalities: Secondary | ICD-10-CM | POA: Diagnosis not present

## 2016-06-30 DIAGNOSIS — K5730 Diverticulosis of large intestine without perforation or abscess without bleeding: Secondary | ICD-10-CM | POA: Diagnosis not present

## 2016-07-02 DIAGNOSIS — K529 Noninfective gastroenteritis and colitis, unspecified: Secondary | ICD-10-CM | POA: Diagnosis not present

## 2016-07-07 DIAGNOSIS — I10 Essential (primary) hypertension: Secondary | ICD-10-CM | POA: Diagnosis not present

## 2016-07-07 DIAGNOSIS — E1165 Type 2 diabetes mellitus with hyperglycemia: Secondary | ICD-10-CM | POA: Diagnosis not present

## 2016-07-24 DIAGNOSIS — E1165 Type 2 diabetes mellitus with hyperglycemia: Secondary | ICD-10-CM | POA: Diagnosis not present

## 2016-07-24 DIAGNOSIS — I10 Essential (primary) hypertension: Secondary | ICD-10-CM | POA: Diagnosis not present

## 2016-07-28 DIAGNOSIS — L409 Psoriasis, unspecified: Secondary | ICD-10-CM | POA: Diagnosis not present

## 2016-07-28 DIAGNOSIS — L4059 Other psoriatic arthropathy: Secondary | ICD-10-CM | POA: Diagnosis not present

## 2016-07-28 DIAGNOSIS — M1712 Unilateral primary osteoarthritis, left knee: Secondary | ICD-10-CM | POA: Diagnosis not present

## 2016-07-28 DIAGNOSIS — Z79899 Other long term (current) drug therapy: Secondary | ICD-10-CM | POA: Diagnosis not present

## 2016-08-21 DIAGNOSIS — R319 Hematuria, unspecified: Secondary | ICD-10-CM | POA: Diagnosis not present

## 2016-10-29 DIAGNOSIS — Z79899 Other long term (current) drug therapy: Secondary | ICD-10-CM | POA: Diagnosis not present

## 2016-10-29 DIAGNOSIS — M1712 Unilateral primary osteoarthritis, left knee: Secondary | ICD-10-CM | POA: Diagnosis not present

## 2016-10-29 DIAGNOSIS — L409 Psoriasis, unspecified: Secondary | ICD-10-CM | POA: Diagnosis not present

## 2016-10-29 DIAGNOSIS — L4059 Other psoriatic arthropathy: Secondary | ICD-10-CM | POA: Diagnosis not present

## 2017-01-12 DIAGNOSIS — R5383 Other fatigue: Secondary | ICD-10-CM | POA: Diagnosis not present

## 2017-01-12 DIAGNOSIS — I10 Essential (primary) hypertension: Secondary | ICD-10-CM | POA: Diagnosis not present

## 2017-01-12 DIAGNOSIS — Z0000 Encounter for general adult medical examination without abnormal findings: Secondary | ICD-10-CM | POA: Diagnosis not present

## 2017-01-12 DIAGNOSIS — E1165 Type 2 diabetes mellitus with hyperglycemia: Secondary | ICD-10-CM | POA: Diagnosis not present

## 2017-01-18 DIAGNOSIS — L409 Psoriasis, unspecified: Secondary | ICD-10-CM | POA: Diagnosis not present

## 2017-01-18 DIAGNOSIS — E1165 Type 2 diabetes mellitus with hyperglycemia: Secondary | ICD-10-CM | POA: Diagnosis not present

## 2017-01-18 DIAGNOSIS — R002 Palpitations: Secondary | ICD-10-CM | POA: Diagnosis not present

## 2017-01-18 DIAGNOSIS — L4059 Other psoriatic arthropathy: Secondary | ICD-10-CM | POA: Diagnosis not present

## 2017-01-18 DIAGNOSIS — E0821 Diabetes mellitus due to underlying condition with diabetic nephropathy: Secondary | ICD-10-CM | POA: Diagnosis not present

## 2017-04-06 DIAGNOSIS — L4059 Other psoriatic arthropathy: Secondary | ICD-10-CM | POA: Diagnosis not present

## 2017-04-06 DIAGNOSIS — Z79899 Other long term (current) drug therapy: Secondary | ICD-10-CM | POA: Diagnosis not present

## 2017-04-06 DIAGNOSIS — M1712 Unilateral primary osteoarthritis, left knee: Secondary | ICD-10-CM | POA: Diagnosis not present

## 2017-04-06 DIAGNOSIS — L409 Psoriasis, unspecified: Secondary | ICD-10-CM | POA: Diagnosis not present

## 2017-04-06 DIAGNOSIS — D751 Secondary polycythemia: Secondary | ICD-10-CM | POA: Diagnosis not present

## 2017-04-08 ENCOUNTER — Encounter: Payer: Medicare HMO | Attending: Internal Medicine | Admitting: Registered"

## 2017-04-08 ENCOUNTER — Encounter: Payer: Self-pay | Admitting: Registered"

## 2017-04-08 DIAGNOSIS — Z713 Dietary counseling and surveillance: Secondary | ICD-10-CM | POA: Insufficient documentation

## 2017-04-08 DIAGNOSIS — E118 Type 2 diabetes mellitus with unspecified complications: Secondary | ICD-10-CM

## 2017-04-08 DIAGNOSIS — E1165 Type 2 diabetes mellitus with hyperglycemia: Secondary | ICD-10-CM | POA: Diagnosis not present

## 2017-04-08 NOTE — Progress Notes (Signed)
Diabetes Self-Management Education  Visit Type: First/Initial  Appt. Start Time: 0930 Appt. End Time: 1110  04/08/2017  Mr. Benjamin Chapman, identified by name and date of birth, is a 66 y.o. male with a diagnosis of Diabetes: Type 2.   ASSESSMENT Patient states 10 yrs ago his A1c was 10% and lowered to 6.5% with diet alone but he doesn't remember dietary changes he made. Per referral labs A1c Dec 2017 was 10% and dropped to 8.3% in Feb 2018. Patient states he didn't make changes specific to controlling BG, rather he was having GI issues which the colonoscopy in Feb 2018 revealed the UC. Patient A1c was 8.0% in September, but with prednisone therepy starting in October, RD suspects it may be going back up and recommended patient start test BG to keep an eye on that number. Patient brought in medications and the bottle of metformin indicated prescription for 1,000 mg taken 2x day. Patient states his PCP cut him back to 1,000 mg 1x day.  Patient expressed concern about his ulcerative colitis which was diagnosed Feb 2018. Pt states he feels many foods just go right through him and he reports losing 40 lbs March - May 2018. Patient states he is taking a high dose of Humira (40 mg) and his rheumatologist wants to work on cutting back this medication and has working on getting a referral to a gastroenterologist.    Patient states he stays pretty active taking care of his 5 & 62 yr old grandchildren, does chores taking care of animals, and does the grocery shopping & cooking. Patient states in the summer he get 8-15K steps per day.  Patient states he has poor sleep, often just 3-4 hours per night.  Patient asked about FODMAPs and IBD-AID diet for UC. RD offered to talk more on this subject at next visit.   Meter given: [employer]  Lot: [id number] X Exp: 06/10/2018  Diabetes Self-Management Education - 04/08/17 1005      Visit Information   Visit Type  First/Initial      Initial Visit   Diabetes Type  Type 2    Are you currently following a meal plan?  No    Are you taking your medications as prescribed?  Yes    Date Diagnosed  15 yrs ago      Health Coping   How would you rate your overall health?  Fair      Psychosocial Assessment   Patient Belief/Attitude about Diabetes  Afraid    How often do you need to have someone help you when you read instructions, pamphlets, or other written materials from your doctor or pharmacy?  1 - Never    What is the last grade level you completed in school?  12      Complications   Last HgB A1C per patient/outside source  8 % per referral labs    How often do you check your blood sugar?  0 times/day (not testing) meter broke    Have you had a dilated eye exam in the past 12 months?  No    Have you had a dental exam in the past 12 months?  No    Are you checking your feet?  Yes    How many days per week are you checking your feet?  1      Dietary Intake   Breakfast  white toast OR eggs    Snack (morning)  none OR fruit OR cheese    Lunch  soup OR 1/2 sandwich    Snack (afternoon)  none    Dinner  chicken OR meatloaf OR hamburger, potatoes, vegetable    Snack (evening)  watching TV, chips or pretzels or banana    Beverage(s)  water      Exercise   Exercise Type  ADL's    How many days per week to you exercise?  0    How many minutes per day do you exercise?  0    Total minutes per week of exercise  0      Patient Education   Disease state   Definition of diabetes, type 1 and 2, and the diagnosis of diabetes    Nutrition management   Role of diet in the treatment of diabetes and the relationship between the three main macronutrients and blood glucose level;Carbohydrate counting;Food label reading, portion sizes and measuring food.    Physical activity and exercise   Role of exercise on diabetes management, blood pressure control and cardiac health.    Monitoring  Identified appropriate SMBG and/or A1C goals.;Taught/discussed  recording of test results and interpretation of SMBG.    Acute complications  Taught treatment of hypoglycemia - the 15 rule.    Chronic complications  Relationship between chronic complications and blood glucose control;Retinopathy and reason for yearly dilated eye exams;Assessed and discussed foot care and prevention of foot problems    Psychosocial adjustment  Role of stress on diabetes      Individualized Goals (developed by patient)   Nutrition  General guidelines for healthy choices and portions discussed    Monitoring   test my blood glucose as discussed      Outcomes   Expected Outcomes  Demonstrated interest in learning. Expect positive outcomes    Future DMSE  4-6 wks    Program Status  Completed       Individualized Plan for Diabetes Self-Management Training:   Learning Objective:  Patient will have a greater understanding of diabetes self-management. Patient education plan is to attend individual and/or group sessions per assessed needs and concerns.   Plan:   Patient Instructions  Consider contacting your GP to let them know you have started prednisone, they may want to increase your metformin to counteract the effect on your blood sugar. Consider checking with insurance company if you want to return for just ulcerative colitis nutrition counseling.  Plan:  Aim for 3-5 Carb Choices per meal (45-75 grams)  Aim for 0-2 Carb choices (0-30 grams Carbs) per snack if hungry  Include protein with your meals and snacks Consider reading food labels for Total Carbohydrate and Sat Fat Grams of foods Continue your activity level as tolerated Consider checking BG at alternate times per day as directed by MD  Consider taking medication as directed by MD Consider having fish 2-3 times per week   Expected Outcomes:  Demonstrated interest in learning. Expect positive outcomes  Education material provided: Living Well with Diabetes, A1C conversion sheet, Snack sheet and  Carbohydrate counting sheet, Sleep Hygiene  If problems or questions, patient to contact team via:  Phone  Future DSME appointment: 4-6 wks

## 2017-04-08 NOTE — Patient Instructions (Addendum)
Consider contacting your GP to let them know you have started prednisone, they may want to increase your metformin to counteract the effect on your blood sugar. Consider checking with insurance company if you want to return for just ulcerative colitis nutrition counseling.  Plan:  Aim for 3-5 Carb Choices per meal (45-75 grams)  Aim for 0-2 Carb choices (0-30 grams Carbs) per snack if hungry  Include protein with your meals and snacks Consider reading food labels for Total Carbohydrate and Sat Fat Grams of foods Continue your activity level as tolerated Consider checking BG at alternate times per day as directed by MD  Consider taking medication as directed by MD Consider having fish 2-3 times per week

## 2017-05-19 DIAGNOSIS — E1165 Type 2 diabetes mellitus with hyperglycemia: Secondary | ICD-10-CM | POA: Diagnosis not present

## 2017-05-19 DIAGNOSIS — I10 Essential (primary) hypertension: Secondary | ICD-10-CM | POA: Diagnosis not present

## 2017-06-01 DIAGNOSIS — K51911 Ulcerative colitis, unspecified with rectal bleeding: Secondary | ICD-10-CM | POA: Diagnosis not present

## 2017-06-03 ENCOUNTER — Ambulatory Visit: Payer: Medicare HMO | Admitting: Registered"

## 2017-06-30 DIAGNOSIS — D124 Benign neoplasm of descending colon: Secondary | ICD-10-CM | POA: Diagnosis not present

## 2017-06-30 DIAGNOSIS — K635 Polyp of colon: Secondary | ICD-10-CM | POA: Diagnosis not present

## 2017-06-30 DIAGNOSIS — K5730 Diverticulosis of large intestine without perforation or abscess without bleeding: Secondary | ICD-10-CM | POA: Diagnosis not present

## 2017-06-30 DIAGNOSIS — Z8719 Personal history of other diseases of the digestive system: Secondary | ICD-10-CM | POA: Diagnosis not present

## 2017-06-30 DIAGNOSIS — D126 Benign neoplasm of colon, unspecified: Secondary | ICD-10-CM | POA: Diagnosis not present

## 2017-06-30 DIAGNOSIS — K644 Residual hemorrhoidal skin tags: Secondary | ICD-10-CM | POA: Diagnosis not present

## 2017-06-30 DIAGNOSIS — K51911 Ulcerative colitis, unspecified with rectal bleeding: Secondary | ICD-10-CM | POA: Diagnosis not present

## 2017-06-30 DIAGNOSIS — M1990 Unspecified osteoarthritis, unspecified site: Secondary | ICD-10-CM | POA: Diagnosis not present

## 2017-06-30 DIAGNOSIS — K6389 Other specified diseases of intestine: Secondary | ICD-10-CM | POA: Diagnosis not present

## 2017-06-30 DIAGNOSIS — K5190 Ulcerative colitis, unspecified, without complications: Secondary | ICD-10-CM | POA: Diagnosis not present

## 2017-06-30 DIAGNOSIS — E119 Type 2 diabetes mellitus without complications: Secondary | ICD-10-CM | POA: Diagnosis not present

## 2017-07-19 DIAGNOSIS — K51011 Ulcerative (chronic) pancolitis with rectal bleeding: Secondary | ICD-10-CM | POA: Diagnosis not present

## 2017-07-19 DIAGNOSIS — Z79899 Other long term (current) drug therapy: Secondary | ICD-10-CM | POA: Diagnosis not present

## 2017-07-19 DIAGNOSIS — Z8719 Personal history of other diseases of the digestive system: Secondary | ICD-10-CM | POA: Diagnosis not present

## 2017-08-10 ENCOUNTER — Other Ambulatory Visit
Admission: RE | Admit: 2017-08-10 | Discharge: 2017-08-10 | Disposition: A | Discharge status: Home / Self Care | Payer: Medicare HMO | Source: Ambulatory Visit | Attending: Gastroenterology | Admitting: Gastroenterology

## 2017-08-10 DIAGNOSIS — K625 Hemorrhage of anus and rectum: Secondary | ICD-10-CM | POA: Diagnosis not present

## 2017-08-25 DIAGNOSIS — E119 Type 2 diabetes mellitus without complications: Secondary | ICD-10-CM | POA: Diagnosis not present

## 2017-08-25 DIAGNOSIS — H524 Presbyopia: Secondary | ICD-10-CM | POA: Diagnosis not present

## 2017-08-25 DIAGNOSIS — I10 Essential (primary) hypertension: Secondary | ICD-10-CM | POA: Diagnosis not present

## 2017-09-17 DIAGNOSIS — H353132 Nonexudative age-related macular degeneration, bilateral, intermediate dry stage: Secondary | ICD-10-CM | POA: Diagnosis not present

## 2017-09-17 DIAGNOSIS — H2513 Age-related nuclear cataract, bilateral: Secondary | ICD-10-CM | POA: Diagnosis not present

## 2017-09-17 DIAGNOSIS — E113293 Type 2 diabetes mellitus with mild nonproliferative diabetic retinopathy without macular edema, bilateral: Secondary | ICD-10-CM | POA: Diagnosis not present

## 2017-09-17 DIAGNOSIS — H43813 Vitreous degeneration, bilateral: Secondary | ICD-10-CM | POA: Diagnosis not present

## 2017-09-21 LAB — MISCELLANEOUS TEST

## 2017-09-30 DIAGNOSIS — L4059 Other psoriatic arthropathy: Secondary | ICD-10-CM | POA: Diagnosis not present

## 2017-10-06 DIAGNOSIS — Z8719 Personal history of other diseases of the digestive system: Secondary | ICD-10-CM | POA: Diagnosis not present

## 2017-10-06 DIAGNOSIS — Z79899 Other long term (current) drug therapy: Secondary | ICD-10-CM | POA: Diagnosis not present

## 2017-10-28 DIAGNOSIS — L4059 Other psoriatic arthropathy: Secondary | ICD-10-CM | POA: Diagnosis not present

## 2017-12-02 DIAGNOSIS — L409 Psoriasis, unspecified: Secondary | ICD-10-CM | POA: Diagnosis not present

## 2017-12-02 DIAGNOSIS — M1990 Unspecified osteoarthritis, unspecified site: Secondary | ICD-10-CM | POA: Diagnosis not present

## 2017-12-02 DIAGNOSIS — M25531 Pain in right wrist: Secondary | ICD-10-CM | POA: Diagnosis not present

## 2017-12-02 DIAGNOSIS — L4059 Other psoriatic arthropathy: Secondary | ICD-10-CM | POA: Diagnosis not present

## 2017-12-02 DIAGNOSIS — Z79899 Other long term (current) drug therapy: Secondary | ICD-10-CM | POA: Diagnosis not present

## 2017-12-02 DIAGNOSIS — D751 Secondary polycythemia: Secondary | ICD-10-CM | POA: Diagnosis not present

## 2018-01-24 DIAGNOSIS — Z79899 Other long term (current) drug therapy: Secondary | ICD-10-CM | POA: Diagnosis not present

## 2018-01-24 DIAGNOSIS — Z8719 Personal history of other diseases of the digestive system: Secondary | ICD-10-CM | POA: Diagnosis not present

## 2018-02-03 DIAGNOSIS — M7022 Olecranon bursitis, left elbow: Secondary | ICD-10-CM | POA: Diagnosis not present

## 2018-03-22 DIAGNOSIS — H43813 Vitreous degeneration, bilateral: Secondary | ICD-10-CM | POA: Diagnosis not present

## 2018-03-22 DIAGNOSIS — E113393 Type 2 diabetes mellitus with moderate nonproliferative diabetic retinopathy without macular edema, bilateral: Secondary | ICD-10-CM | POA: Diagnosis not present

## 2018-03-22 DIAGNOSIS — H353132 Nonexudative age-related macular degeneration, bilateral, intermediate dry stage: Secondary | ICD-10-CM | POA: Diagnosis not present

## 2018-05-10 DIAGNOSIS — R05 Cough: Secondary | ICD-10-CM | POA: Diagnosis not present

## 2018-05-10 DIAGNOSIS — R079 Chest pain, unspecified: Secondary | ICD-10-CM | POA: Diagnosis not present

## 2018-05-23 DIAGNOSIS — K51919 Ulcerative colitis, unspecified with unspecified complications: Secondary | ICD-10-CM | POA: Diagnosis not present

## 2018-06-04 ENCOUNTER — Other Ambulatory Visit: Payer: Self-pay | Admitting: Cardiology

## 2018-06-04 DIAGNOSIS — Z136 Encounter for screening for cardiovascular disorders: Secondary | ICD-10-CM

## 2018-06-04 DIAGNOSIS — R0989 Other specified symptoms and signs involving the circulatory and respiratory systems: Secondary | ICD-10-CM

## 2018-06-04 DIAGNOSIS — I358 Other nonrheumatic aortic valve disorders: Secondary | ICD-10-CM

## 2018-06-14 ENCOUNTER — Other Ambulatory Visit: Payer: Self-pay | Admitting: Cardiology

## 2018-06-14 DIAGNOSIS — R0609 Other forms of dyspnea: Principal | ICD-10-CM

## 2018-07-07 ENCOUNTER — Ambulatory Visit: Payer: Medicare Other

## 2018-07-07 DIAGNOSIS — R0989 Other specified symptoms and signs involving the circulatory and respiratory systems: Secondary | ICD-10-CM | POA: Diagnosis not present

## 2018-07-07 DIAGNOSIS — I358 Other nonrheumatic aortic valve disorders: Secondary | ICD-10-CM

## 2018-07-07 DIAGNOSIS — Z136 Encounter for screening for cardiovascular disorders: Secondary | ICD-10-CM | POA: Diagnosis not present

## 2018-07-08 ENCOUNTER — Ambulatory Visit: Payer: Medicare Other

## 2018-07-08 DIAGNOSIS — R0609 Other forms of dyspnea: Secondary | ICD-10-CM | POA: Diagnosis not present

## 2018-07-10 ENCOUNTER — Telehealth: Payer: Self-pay | Admitting: Cardiology

## 2018-07-10 NOTE — Telephone Encounter (Signed)
Echocardiogram reveals mild diastolic dysfunction and may explain some of his dyspnea. Diastolic function means the heart has to relax to receive the blood so it can pump the blood out. If relaxation is impaired, then the blood coming to the heart is restricted and can cause dyspnea and reduced functional capacity and fluid build up. Exercise, weight loss, control of BP, salt restriction will improve this.  Otherwise carotid study and abdominal duplex was fairly within normal limits for his age.

## 2018-07-12 NOTE — Telephone Encounter (Signed)
LMOM with details

## 2018-07-14 NOTE — Progress Notes (Signed)
Subjective:   Benjamin Chapman, adult    DOB: [birth year], 68 y.o.   MRN: [id number]  [name], MD:  Chief Complaint  Patient presents with  . Results    nuc, echo, vasc  . Follow-up    HPI: Benjamin Chapman  is a 68 y.o. adult  with psoriatic arthritis, type 2 diabetes, hypertension, ulcerative colitis, and psoriasis.  Patient was last seen in January to establish care for newly noted aortic systolic murmur.  Patient has had occasional episodes of chest discomfort and also mentioned dyspnea on exertion.  In view of his risk factors, he underwent exercise nuclear stress test on 07/08/2018 revealing moderate sized medium intensity perfusion defect in mid to basal inferior/inferolateral wall and was considered high risk study with submaximal stress.  Echocardiogram revealed diastolic dysfunction with trace aortic stenosis.  He was without evidence of abdominal aortic aneurysm.  Carotid duplex did not reveal any hemodynamically significant arterial disease.  He now presents to discuss results.  He has not had any reoccurrence of chest pain since last seen by [place]. Today, he denies any dyspnea on exertion. 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   He reports diabetes is fairly well controlled. Hypertension is generally well controlled.    Past Medical History:  Diagnosis Date  . AAA (abdominal aortic aneurysm) (HCC)    Screening  . Bilateral carotid bruits   . Diabetes mellitus without complication ([place])   . Dyspnea   . Hypertension   . Psoriatic arthritis ([place])   . Systolic murmur of aorta   . Ulcerative colitis, acute [facility])     Past Surgical History:  Procedure Laterality Date  . KNEE ARTHROSCOPY Right   . MELANOMA EXCISION N/A 1992   back  . SHOULDER ARTHROSCOPY     bi-cept    Family History  Problem Relation Age of Onset  . Alzheimer's disease Mother   . Stroke Mother   .  Diabetes Mother     Social History   Socioeconomic History  . Marital status: Married    Spouse name: Not on file  . Number of children: 2  . Years of education: Not on file  . Highest education level: Not on file  Occupational History  . Not on file  Social Needs  . Financial resource strain: Not on file  . Food insecurity:    Worry: Not on file    Inability: Not on file  . Transportation needs:    Medical: Not on file    Non-medical: Not on file  Tobacco Use  . Smoking status: Former Smoker    Packs/day: 1.00    Years: 20.00    Pack years: 20.00    Types: Cigarettes    Last attempt to quit: 07/15/1998    Years since quitting: 20.0  . Smokeless tobacco: Never Used  Substance and Sexual Activity  . Alcohol use: Yes    Comment: occasional  . Drug use: Not on file  . Sexual activity: Never  Lifestyle  . Physical activity:    Days per week: Not on file    Minutes per session: Not on file  . Stress: Not on file  Relationships  . Social connections:    Talks on phone: Not on file    Gets together: Not on file    Attends religious service: Not on file    Active member of club  or organization: Not on file    Attends meetings of clubs or organizations: Not on file    Relationship status: Not on file  . Intimate partner violence:    Fear of current or ex partner: Not on file    Emotionally abused: Not on file    Physically abused: Not on file    Forced sexual activity: Not on file  Other Topics Concern  . Not on file  Social History Narrative  . Not on file    Current Meds  Medication Sig  . diphenoxylate-atropine (LOMOTIL) 2.5-0.025 MG tablet Take by mouth continuous as needed.  [name] Kitchen ibuprofen (ADVIL,MOTRIN) 200 MG tablet Take 200 mg by mouth every 6 (six) hours as needed.  [name] Kitchen lisinopril (PRINIVIL,ZESTRIL) 40 MG tablet Take 40 mg by mouth daily.  . mesalamine (APRISO) 0.375 g 24 hr capsule Take 375 mg by mouth daily. 4 daily  . metFORMIN (GLUCOPHAGE) 500 MG tablet  Take 1,000 mg by mouth AC breakfast.  . VITAMIN D, CHOLECALCIFEROL, PO Take by mouth daily.      Review of Systems  Constitution: Negative for decreased appetite, malaise/fatigue, weight gain and weight loss.  Eyes: Negative for visual disturbance.  Cardiovascular: Negative for chest pain, claudication, dyspnea on exertion, leg swelling, orthopnea, palpitations and syncope.  Respiratory: Negative for hemoptysis and wheezing.   Endocrine: Negative for cold intolerance and heat intolerance.  Hematologic/Lymphatic: Does not bruise/bleed easily.  Skin: Negative for nail changes.  Musculoskeletal: Negative for muscle weakness and myalgias.  Gastrointestinal: Negative for abdominal pain, change in bowel habit, nausea and vomiting.  Neurological: Negative for difficulty with concentration, dizziness, focal weakness and headaches.  Psychiatric/Behavioral: Negative for altered mental status and suicidal ideas.  All other systems reviewed and are negative.      Objective:     Blood pressure 127/75, pulse 72, height _0  (1.702 m), weight 216 lb 3.2 oz (98.1 kg), SpO2 93 %.  Cardiac studies:  Exercise myoview stress 07/08/2018: 1. The patient performed treadmill exercise using Bruce protocol, completing 8:30 minutes. The patient completed an estimated workload of 8.9 METS, reaching 82% of the maximum predicted heart rate. Exercise capacity was normal. Abnormal hemodynamic response was seen- Resting hypertension 162/86 mmHg, without expected increase in blood pressure with peak exercise-166/80 mmHg. Stress symptoms included fatigue. No ischemic changes seen on stress electrocardiogram. 2. The overall quality of the study is good. Left ventricular cavity is noted to be normal on the rest and stress studies. Gated SPECT imaging demonstrates hypokinesis of the basal inferior, basal inferolateral, mid inferior and mid inferolateral myocardial wall(s). The left ventricular ejection fraction was  calculated or visually estimated to be 60%. Moderate sized, medium intensity, predominantly reversible perfusion defect in mid to basal inferior/inferolateral myocardium, suggestive of small infarct with moderate peri infarct ischemia in LCx/PDA territory.  3. High risk study with submaximal stress.  Echocardiogram 07/07/2018: 1. Left ventricle cavity is normal in size. Normal global wall motion. Doppler evidence of grade I (impaired) diastolic dysfunction, elevated LVEDP/LA pressure. Calculated EF 55%. 2. Mild aortic valve leaflet calcification. Mildly restricted aortic valve leaflets. Trace aortic valve stenosis. Peak gradient 22, mean gradient 10 mmHg, AVA 1.77 cm. No aortic valve regurgitation noted. 3. Trace tricuspid regurgitation. Unable to estimate PA pressure due to absence/minimal TR signal.  Carotid artery duplex 07/07/2018: No hemodynamically significant arterial disease in the internal carotid artery bilaterally. Minimal calcific plaque noted biulateral carotids. Antegrade right vertebral artery flow. Antegrade left vertebral artery flow.  Abdominal Aortic Duplex  07/07/2018 : No AAA observed. Normal iliac artery velocity. Large fluid filled area below naval measuring 7.3 cm.  Appears to be a simple cyst, not well studied. If clinically indicated, consider general US abdomen. See image.  Labs:  12/02/2017: RBC 5.7, CBC otherwise normal. Creatinine 0.9, EGFR 93/113, potassium 4.7, total protein 8.6, CMP otherwise normal.   Physical Exam  Constitutional: She appears well-developed. No distress.  Morbidly obese  HENT:  Head: Atraumatic.  Eyes: Conjunctivae are normal.  Neck: Neck supple. No thyromegaly present.  Short neck and difficult to evaluate JVP  Cardiovascular: Normal rate and regular rhythm. Exam reveals no gallop.  Murmur heard.  Early systolic murmur is present with a grade of 2/6. Aortic Pulses:      Carotid pulses are 2+ on the right side and 2+ on the left  side.      Dorsalis pedis pulses are 2+ on the right side and 2+ on the left side.       Posterior tibial pulses are 2+ on the right side and 2+ on the left side.  Femoral and popliteal pulse difficult to feel due to patient's body habitus.   Pulmonary/Chest: Effort normal and breath sounds normal.  Abdominal: Soft. Bowel sounds are normal.  Musculoskeletal: Normal range of motion.        General: No edema.  Neurological: She is alert.  Skin: Skin is warm and dry.  Psychiatric: She has a normal mood and affect.      Assessment & Recommendations:  1. Abnormal nuclear stress test Test results were discussed with the patient. In view of his lack of symptoms, discussed options of coronary angiogram vs medical management. Patient prefers medical management for now. I will start him on statin therapy and also advised daily 58m ASA. Will have low threshold to perform coronary angiogram as I feel patient is high risk given diabetes, psoriatic arthritis, age, and former tobacco use. I have sent in prescription for nitroglycerin and advised how to use. I have encouraged him to contact me for any symptoms of chest pain or dyspnea.   2. Nonrheumatic aortic valve stenosis Trace aortic stenosis was noted. Likely etiology for murmur. Will need repeat echo in the future if clinically indicated.   3. Essential hypertension Well controlled on lisinopril. In view of lack of symptoms and well controlled blood pressure, I have not started him on beta blocker or calcium channel blocker.   4. Abnormal abdominal ultrasound Noted to have 7.3 cm fluid filled area below naval that is felt to be likely simple cyst; however, will defer to PCP for further evaluation. General abdominal ultrasound was recommended.  I will see him back in 8 weeks for close monitoring.  AJeri Lager MSN, APRN, FNP-C [facility], PBasaltOffice: [phone]: [phone]

## 2018-07-15 ENCOUNTER — Encounter: Payer: Self-pay | Admitting: Cardiology

## 2018-07-15 ENCOUNTER — Ambulatory Visit: Payer: Medicare Other | Admitting: Cardiology

## 2018-07-15 VITALS — BP 127/75 | HR 72 | Ht 67.0 in | Wt 216.2 lb

## 2018-07-15 DIAGNOSIS — R935 Abnormal findings on diagnostic imaging of other abdominal regions, including retroperitoneum: Secondary | ICD-10-CM | POA: Diagnosis not present

## 2018-07-15 DIAGNOSIS — I350 Nonrheumatic aortic (valve) stenosis: Secondary | ICD-10-CM

## 2018-07-15 DIAGNOSIS — R9439 Abnormal result of other cardiovascular function study: Secondary | ICD-10-CM

## 2018-07-15 DIAGNOSIS — I10 Essential (primary) hypertension: Secondary | ICD-10-CM

## 2018-07-15 MED ORDER — ROSUVASTATIN CALCIUM 20 MG PO TABS: 20.0000 mg | ORAL_TABLET | Freq: Every day | ORAL | 2 refills | Status: DC

## 2018-07-15 MED ORDER — NITROGLYCERIN 0.4 MG SL SUBL: 0.4000 mg | SUBLINGUAL_TABLET | SUBLINGUAL | 3 refills | Status: DC | PRN

## 2018-08-17 ENCOUNTER — Other Ambulatory Visit: Payer: Self-pay

## 2018-08-17 MED ORDER — ROSUVASTATIN CALCIUM 20 MG PO TABS: 20.0000 mg | ORAL_TABLET | Freq: Every day | ORAL | 1 refills | Status: DC

## 2018-08-17 MED ORDER — NITROGLYCERIN 0.4 MG SL SUBL: 0.4000 mg | SUBLINGUAL_TABLET | SUBLINGUAL | 1 refills | Status: DC | PRN

## 2018-08-24 ENCOUNTER — Telehealth: Payer: Self-pay

## 2018-08-24 NOTE — Telephone Encounter (Signed)
Pt called stating that he has been having chest tightness along with heavy breathing/sob with exertion, when working x 1 week. Has been doing a lot of yard work. Tried nitro couple times with relief. Symptoms are ok as long as he is sitting. Please advise.//ah

## 2018-08-24 NOTE — Telephone Encounter (Signed)
If COVID screening is negative, set up clinic visit please

## 2018-08-24 NOTE — Telephone Encounter (Signed)
Pt transferred to [facility] for screening and scheduling.//ah

## 2018-08-25 ENCOUNTER — Other Ambulatory Visit: Payer: Self-pay

## 2018-08-25 ENCOUNTER — Ambulatory Visit: Payer: Medicare Other | Admitting: Cardiology

## 2018-08-25 ENCOUNTER — Encounter: Payer: Self-pay | Admitting: Cardiology

## 2018-08-25 VITALS — BP 152/81 | HR 74 | Ht 67.0 in | Wt 217.0 lb

## 2018-08-25 DIAGNOSIS — I25119 Atherosclerotic heart disease of native coronary artery with unspecified angina pectoris: Secondary | ICD-10-CM | POA: Diagnosis not present

## 2018-08-25 DIAGNOSIS — R9439 Abnormal result of other cardiovascular function study: Secondary | ICD-10-CM | POA: Diagnosis not present

## 2018-08-25 DIAGNOSIS — E119 Type 2 diabetes mellitus without complications: Secondary | ICD-10-CM | POA: Diagnosis not present

## 2018-08-25 DIAGNOSIS — I209 Angina pectoris, unspecified: Secondary | ICD-10-CM

## 2018-08-25 DIAGNOSIS — I2510 Atherosclerotic heart disease of native coronary artery without angina pectoris: Secondary | ICD-10-CM

## 2018-08-25 MED ORDER — METOPROLOL TARTRATE 25 MG PO TABS: 25.0000 mg | ORAL_TABLET | Freq: Two times a day (BID) | ORAL | 2 refills | Status: DC

## 2018-08-25 NOTE — Progress Notes (Signed)
Subjective:  Primary Physician/Referring:  [name], MD  Patient ID: Benjamin Chapman, adult    DOB: May 18, 1950, 68 y.o.   MRN: [id number]  Chief Complaint  Patient presents with  . Chest Pain    chest pain on exertion for about 1.5 weeks    HPI: Benjamin Chapman  is a 68 y.o. adult  with psoriatic arthritis, Hypertension, hyperlipidemia, type 2 diabetes, hypertension, ulcerative colitis, and psoriasis.  Patient has had occasional episodes of chest discomfort and dyspnea on exertion.  In view of his risk factors, he underwent exercise nuclear stress test on 07/08/2018 revealing moderate sized medium intensity perfusion defect in mid to basal inferior/inferolateral wall and was considered high risk study with submaximal stress.  Echocardiogram revealed diastolic dysfunction with trace aortic stenosis.  He was without evidence of abdominal aortic aneurysm.  Carotid duplex did not reveal any hemodynamically significant arterial disease.  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   He called our office stating that he's been having worsening dyspnea on exertion and also chest tightness and has to use sublingual nitroglycerin and is relieved with rest. He has used for sublingual nitroglycerin in the past one week and has reduced his physical activity.  However he denies rest pain.  No PND or orthopnea.  He is now seen on an urgent basis in the office due to high risk stress test and known high risk factors including diabetes, hypertension and hyperlipidemia. He reports diabetes, Hypertension and hyperlipidemia is fairly well controlled.    Past Medical History:  Diagnosis Date  . Bilateral carotid bruits   . Diabetes mellitus without complication ([place])   . Dyspnea   . Hypertension   . Psoriatic arthritis ([place])   . Systolic murmur of aorta   . Ulcerative colitis, acute [place])     Past Surgical History:  Procedure Laterality Date  . KNEE  ARTHROSCOPY Right   . MELANOMA EXCISION N/A 1992   back  . SHOULDER ARTHROSCOPY     bi-cept    Social History   Socioeconomic History  . Marital status: Married    Spouse name: Not on file  . Number of children: 2  . Years of education: Not on file  . Highest education level: Not on file  Occupational History  . Not on file  Social Needs  . Financial resource strain: Not on file  . Food insecurity:    Worry: Not on file    Inability: Not on file  . Transportation needs:    Medical: Not on file    Non-medical: Not on file  Tobacco Use  . Smoking status: Former Smoker    Packs/day: 1.00    Years: 20.00    Pack years: 20.00    Types: Cigarettes    Last attempt to quit: 07/15/1998    Years since quitting: 20.1  . Smokeless tobacco: Never Used  Substance and Sexual Activity  . Alcohol use: Yes    Comment: occasional  . Drug use: Not on file  . Sexual activity: Never  Lifestyle  . Physical activity:    Days per week: Not on file    Minutes per session: Not on file  . Stress: Not on file  Relationships  . Social connections:    Talks on phone: Not on file    Gets together: Not on file    Attends religious service: Not on file    Active member of club or  organization: Not on file    Attends meetings of clubs or organizations: Not on file    Relationship status: Not on file  . Intimate partner violence:    Fear of current or ex partner: Not on file    Emotionally abused: Not on file    Physically abused: Not on file    Forced sexual activity: Not on file  Other Topics Concern  . Not on file  Social History Narrative  . Not on file    Current Outpatient Medications on File Prior to Visit  Medication Sig Dispense Refill  . acetaminophen (TYLENOL) 325 MG tablet Take 650 mg by mouth every 6 (six) hours as needed.    [name] Kitchen aspirin 81 MG tablet Take 81 mg by mouth daily.    . diphenoxylate-atropine (LOMOTIL) 2.5-0.025 MG tablet Take by mouth continuous as needed.    [name] Kitchen  lisinopril (PRINIVIL,ZESTRIL) 40 MG tablet Take 40 mg by mouth daily.    . mesalamine (APRISO) 0.375 g 24 hr capsule Take 375 mg by mouth daily. 4 daily    . metFORMIN (GLUCOPHAGE) 500 MG tablet Take 1,000 mg by mouth AC breakfast.    . Multiple Vitamins-Minerals (CENTRUM SILVER 50+MEN) TABS Take 1 tablet by mouth daily.    . nitroGLYCERIN (NITROSTAT) 0.4 MG SL tablet Place 1 tablet (0.4 mg total) under the tongue every 5 (five) minutes as needed for chest pain. 90 tablet 1  . Omega-3 Fatty Acids (FISH OIL) 1200 MG CPDR Take by mouth.    . rosuvastatin (CRESTOR) 20 MG tablet Take 1 tablet (20 mg total) by mouth daily. 90 tablet 1  . VITAMIN D, CHOLECALCIFEROL, PO Take by mouth daily.      No current facility-administered medications on file prior to visit.     Review of Systems  Constitution: Negative for chills, decreased appetite, malaise/fatigue and weight gain.  Cardiovascular: Positive for chest pain and dyspnea on exertion. Negative for leg swelling and syncope.  Endocrine: Negative for cold intolerance.  Hematologic/Lymphatic: Does not bruise/bleed easily.  Musculoskeletal: Positive for arthritis and joint pain. Negative for joint swelling.  Gastrointestinal: Negative for abdominal pain, anorexia and change in bowel habit.  Neurological: Negative for headaches and light-headedness.  Psychiatric/Behavioral: Negative for depression and substance abuse.  All other systems reviewed and are negative.      Objective:  Blood pressure (!) 152/81, pulse 74, height '5\' 7"'$  (1.702 m), weight 217 lb (98.4 kg), SpO2 97 %. Body mass index is 33.99 kg/m.  Physical Exam  Constitutional: She appears well-developed and well-nourished. No distress.  HENT:  Head: Atraumatic.  Eyes: Conjunctivae are normal.  Neck: Neck supple. No JVD present. No thyromegaly present.  Cardiovascular: Normal rate, regular rhythm, S1 normal, S2 normal and intact distal pulses. Exam reveals no gallop.  Murmur heard.   Harsh early systolic murmur is present with a grade of 2/6 at the upper right sternal border. I  Pulmonary/Chest: Effort normal and breath sounds normal.  Abdominal: Soft. Bowel sounds are normal. A hernia (ventral and small umbilical hernia) is present.  Musculoskeletal: Normal range of motion.        General: No edema.  Neurological: She is alert.  Skin: Skin is warm and dry.  Psychiatric: She has a normal mood and affect.    Radiology: No results found.  Laboratory Examination:  12/02/2017: RBC 5.7, CBC otherwise normal. Creatinine 0.9, EGFR 93/113, potassium 4.7, total protein 8.6, CMP otherwise normal.  CMP Latest Ref Rng & Units 11/19/2015 11/19/2015  Glucose 70 - 140 mg/dl 202(H) -  BUN 7.0 - 26.0 mg/dL 10.3 -  Creatinine 0.7 - 1.3 mg/dL 0.8 -  Sodium 136 - 145 mEq/L 137 -  Potassium 3.5 - 5.1 mEq/L 3.8 -  CO2 22 - 29 mEq/L 23 -  Calcium 8.4 - 10.4 mg/dL 9.4 -  Total Protein 6.0 - 8.5 g/dL 8.0 7.4  Total Bilirubin 0.20 - 1.20 mg/dL 0.48 -  Alkaline Phos 40 - 150 U/L 67 -  AST 5 - 34 U/L 29 -  ALT 0 - 55 U/L 39 -   CBC Latest Ref Rng & Units 11/19/2015  WBC 4.0 - 10.3 10e3/uL 9.2  Hemoglobin 13.0 - 17.1 g/dL 15.8  Hematocrit 38.4 - 49.9 % 44.7  Platelets 140 - 400 10e3/uL 189   Lipid Panel  No results found for: CHOL, TRIG, HDL, CHOLHDL, VLDL, LDLCALC, LDLDIRECT HEMOGLOBIN A1C No results found for: HGBA1C, MPG TSH No results for input(s): TSH in the last 8760 hours.   Cardiac studies:   Exercise myoview stress 07/08/2018: 1. The patient performed treadmill exercise using Bruce protocol, completing 8:30 minutes. The patient completed an estimated workload of 8.9 METS, reaching 82% of the maximum predicted heart rate. Exercise capacity was normal. Abnormal hemodynamic response was seen- Resting hypertension 162/86 mmHg, without expected increase in blood pressure with peak exercise-166/80 mmHg. Stress symptoms included fatigue. No ischemic changes seen on stress  electrocardiogram. 2. The overall quality of the study is good.  Left ventricular cavity is noted to be normal on the rest and stress studies.  Gated SPECT imaging demonstrates hypokinesis of the basal inferior, basal inferolateral, mid inferior and mid inferolateral myocardial wall(s).  The left ventricular ejection fraction was calculated or visually estimated to be 60%.  Moderate sized, medium intensity, predominantly reversible perfusion defect in mid to basal inferior/inferolateral myocardium, suggestive of small infarct with moderate peri infarct ischemia in LCx/PDA territory.  3. High risk study with submaximal stress.  Echocardiogram  07/07/2018: 1. Left ventricle cavity is normal in size. Normal global wall motion. Doppler evidence of grade I (impaired) diastolic dysfunction, elevated LVEDP/LA pressure. Calculated EF 55%. 2. Mild aortic valve leaflet calcification. Mildly restricted aortic valve leaflets. Trace aortic valve stenosis. Peak gradient 22, mean gradient 10 mmHg, AVA 1.77 cm. No aortic valve regurgitation noted. 3. Trace tricuspid regurgitation. Unable to estimate PA pressure due to absence/minimal TR signal.   Carotid artery duplex  07/07/2018: No hemodynamically significant arterial disease in the internal carotid artery bilaterally. Minimal calcific plaque noted biulateral carotids. Antegrade right vertebral artery flow. Antegrade left vertebral artery flow.  Abdominal Aortic Duplex  07/07/2018 : No AAA observed. Normal iliac artery velocity. Large fluid filled area below naval measuring 7.3 cm.  Appears to be a simple cyst, not well studied. If clinically indicated, consider general US abdomen. See image.  Assessment:    Angina pectoris ([place]) - Plan: EKG 12-Lead, metoprolol tartrate (LOPRESSOR) 25 MG tablet  Coronary artery calcification seen on CT scan  Controlled type 2 diabetes mellitus without complication, without long-term current use of insulin (HCC)  Abnormal  nuclear stress test - Plan: EKG 12-Lead  EKG 08/25/2018: Normal sinus rhythm at rate of 70 bpm, left atrial abnormality.  Otherwise normal EKG. No significant change from  EKG 06/02/2018.  Recommendations:    Patient with established coronary atherosclerosis by CT scan in 2018, abnormal stress test, Diabetes and infirmity arthritis now presenting with class III angina pectoris, previously blood pressure was very soft at systolic of  1 24 mmHg hence not started on any beta blockers and was on lisinopril.  I have started him on 25 mg metoprolol b.i.d., advised him that if symptoms of angina still persist we should proceed with cardiac catheterization as clinically also his high risk.  But would like to try medical therapy in view of Covid 19 issues but would have a low threshold to proceed with cardiac catheterization.  If he develops any dizziness are low blood pressure, the could even potentially discontinue lisinopril and continue beta blocker for cardiovascular protection.  During AAA screening, noted to have 7.3 cm fluid filled area below naval that is felt to be likely simple cyst. General abdominal ultrasound was recommended. Will set this up at a later date, there is no urgency.  I have discussed this with the patient.  Adrian Prows, MD, [facility] 08/25/2018, 9:13 AM [facility]. [place] Pager: [phone] Office: [phone] If no answer Cell [phone]

## 2018-09-02 ENCOUNTER — Other Ambulatory Visit: Payer: Self-pay | Admitting: Cardiology

## 2018-09-02 DIAGNOSIS — I209 Angina pectoris, unspecified: Secondary | ICD-10-CM

## 2018-09-02 MED ORDER — METOPROLOL TARTRATE 25 MG PO TABS: 25.0000 mg | ORAL_TABLET | Freq: Two times a day (BID) | ORAL | 0 refills | Status: DC

## 2018-09-09 ENCOUNTER — Ambulatory Visit: Payer: Medicare Other | Admitting: Cardiology

## 2018-09-21 ENCOUNTER — Ambulatory Visit: Payer: Medicare Other | Admitting: Cardiology

## 2018-09-26 ENCOUNTER — Ambulatory Visit: Admit: 2018-09-26 | Payer: Medicare HMO | Admitting: Gastroenterology

## 2018-09-26 SURGERY — COLONOSCOPY WITH PROPOFOL
Anesthesia: General

## 2018-09-29 ENCOUNTER — Other Ambulatory Visit: Payer: Self-pay

## 2018-09-29 NOTE — Patient Outreach (Signed)
[facility]) Care Management  09/29/2018  Benjamin Chapman [birth year] [id number]   Medication Adherence call to Benjamin Chapman [facility] Verify spoke with patient he is due on Lisinopril 40 mg patient explain he was receiving it from [place] but now he wants to start receiving it thru mail order patient has already sign up and has already order thru mail and does not know why he has not received it. Call [facility] they said they have and old prescription that is expired and that  they need a new prescription,left a message at doctor office for them to send in a prescription on Lisinopril to [facility]. Benjamin Chapman is showing past due under Benjamin.   [place] Management Direct Dial [phone]  Fax [phone] [name]

## 2018-10-04 DIAGNOSIS — R5383 Other fatigue: Secondary | ICD-10-CM | POA: Diagnosis not present

## 2018-10-04 DIAGNOSIS — E1165 Type 2 diabetes mellitus with hyperglycemia: Secondary | ICD-10-CM | POA: Diagnosis not present

## 2018-10-04 DIAGNOSIS — I10 Essential (primary) hypertension: Secondary | ICD-10-CM | POA: Diagnosis not present

## 2018-10-13 DIAGNOSIS — Z0000 Encounter for general adult medical examination without abnormal findings: Secondary | ICD-10-CM | POA: Diagnosis not present

## 2018-10-13 DIAGNOSIS — K5190 Ulcerative colitis, unspecified, without complications: Secondary | ICD-10-CM | POA: Diagnosis not present

## 2018-10-13 DIAGNOSIS — L409 Psoriasis, unspecified: Secondary | ICD-10-CM | POA: Diagnosis not present

## 2018-10-13 DIAGNOSIS — L4059 Other psoriatic arthropathy: Secondary | ICD-10-CM | POA: Diagnosis not present

## 2018-10-13 DIAGNOSIS — E1165 Type 2 diabetes mellitus with hyperglycemia: Secondary | ICD-10-CM | POA: Diagnosis not present

## 2018-10-13 DIAGNOSIS — E0821 Diabetes mellitus due to underlying condition with diabetic nephropathy: Secondary | ICD-10-CM | POA: Diagnosis not present

## 2018-10-18 ENCOUNTER — Other Ambulatory Visit: Payer: Self-pay | Admitting: Cardiology

## 2018-10-25 DIAGNOSIS — E119 Type 2 diabetes mellitus without complications: Secondary | ICD-10-CM | POA: Diagnosis not present

## 2018-10-25 DIAGNOSIS — H353132 Nonexudative age-related macular degeneration, bilateral, intermediate dry stage: Secondary | ICD-10-CM | POA: Diagnosis not present

## 2018-10-25 DIAGNOSIS — H25813 Combined forms of age-related cataract, bilateral: Secondary | ICD-10-CM | POA: Diagnosis not present

## 2018-10-25 DIAGNOSIS — H02831 Dermatochalasis of right upper eyelid: Secondary | ICD-10-CM | POA: Diagnosis not present

## 2018-11-03 ENCOUNTER — Ambulatory Visit: Payer: Medicare Other | Admitting: Cardiology

## 2018-11-03 ENCOUNTER — Other Ambulatory Visit: Payer: Self-pay | Admitting: Cardiology

## 2018-11-03 DIAGNOSIS — I209 Angina pectoris, unspecified: Secondary | ICD-10-CM

## 2018-11-08 ENCOUNTER — Ambulatory Visit: Payer: Medicare Other | Admitting: Cardiology

## 2018-11-23 ENCOUNTER — Encounter: Payer: Self-pay | Admitting: Cardiology

## 2018-11-25 ENCOUNTER — Encounter: Payer: Self-pay | Admitting: Cardiology

## 2018-11-25 ENCOUNTER — Other Ambulatory Visit: Payer: Self-pay

## 2018-11-25 ENCOUNTER — Ambulatory Visit (INDEPENDENT_AMBULATORY_CARE_PROVIDER_SITE_OTHER): Payer: Medicare Other | Admitting: Cardiology

## 2018-11-25 VITALS — BP 149/70 | HR 56 | Ht 67.0 in | Wt 204.6 lb

## 2018-11-25 DIAGNOSIS — R0609 Other forms of dyspnea: Secondary | ICD-10-CM | POA: Diagnosis not present

## 2018-11-25 DIAGNOSIS — I10 Essential (primary) hypertension: Secondary | ICD-10-CM | POA: Diagnosis not present

## 2018-11-25 DIAGNOSIS — I2510 Atherosclerotic heart disease of native coronary artery without angina pectoris: Secondary | ICD-10-CM | POA: Diagnosis not present

## 2018-11-25 DIAGNOSIS — I209 Angina pectoris, unspecified: Secondary | ICD-10-CM

## 2018-11-25 MED ORDER — ATORVASTATIN CALCIUM 10 MG PO TABS: 10.0000 mg | ORAL_TABLET | Freq: Every day | ORAL | 2 refills | Status: DC

## 2018-11-25 MED ORDER — AMLODIPINE BESYLATE 5 MG PO TABS: 5.0000 mg | ORAL_TABLET | Freq: Every day | ORAL | 6 refills | Status: DC

## 2018-11-25 NOTE — Progress Notes (Signed)
Subjective:  Primary Physician/Referring:  [name], MD  Patient ID: Benjamin Chapman, adult    DOB: 09-15-1950, 68 y.o.   MRN: [id number]  Chief Complaint  Patient presents with  . Coronary Artery Disease  . Chest Pain    HPI: Benjamin Chapman  is a 68 y.o. adult  with psoriatic arthritis, Hypertension, hyperlipidemia, type 2 diabetes, hypertension, ulcerative colitis, and psoriasis.  Patient has had occasional episodes of chest discomfort and dyspnea on exertion.  In view of his risk factors, he underwent exercise nuclear stress test on 07/08/2018 revealing moderate sized medium intensity perfusion defect in mid to basal inferior/inferolateral wall and was considered high risk study with submaximal stress.    Echocardiogram revealed diastolic dysfunction with trace aortic stenosis.  He was without evidence of abdominal aortic aneurysm.  Carotid duplex did not reveal any hemodynamically significant arterial disease.  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   He has not had recurrence of angina since I last saw him  2 months ago.  He has lost about 15-20 Lbs.   Past Medical History:  Diagnosis Date  . Bilateral carotid bruits   . Diabetes mellitus without complication ([place])   . Dyspnea   . Hypertension   . Psoriatic arthritis ([place])   . Systolic murmur of aorta   . Ulcerative colitis, acute [facility])     Past Surgical History:  Procedure Laterality Date  . KNEE ARTHROSCOPY Right   . MELANOMA EXCISION N/A 1992   back  . SHOULDER ARTHROSCOPY     bi-cept    Social History   Socioeconomic History  . Marital status: Married    Spouse name: Not on file  . Number of children: 2  . Years of education: Not on file  . Highest education level: Not on file  Occupational History  . Not on file  Social Needs  . Financial resource strain: Not on file  . Food insecurity    Worry: Not on file    Inability: Not on file  .  Transportation needs    Medical: Not on file    Non-medical: Not on file  Tobacco Use  . Smoking status: Former Smoker    Packs/day: 1.00    Years: 20.00    Pack years: 20.00    Types: Cigarettes    Quit date: 07/15/1998    Years since quitting: 20.3  . Smokeless tobacco: Never Used  Substance and Sexual Activity  . Alcohol use: Yes    Comment: occasional  . Drug use: Not on file  . Sexual activity: Never  Lifestyle  . Physical activity    Days per week: Not on file    Minutes per session: Not on file  . Stress: Not on file  Relationships  . Social Herbalist on phone: Not on file    Gets together: Not on file    Attends religious service: Not on file    Active member of club or organization: Not on file    Attends meetings of clubs or organizations: Not on file    Relationship status: Not on file  . Intimate partner violence    Fear of current or ex partner: Not on file    Emotionally abused: Not on file    Physically abused: Not on file    Forced sexual activity: Not on file  Other Topics Concern  . Not on file  Social History  Narrative  . Not on file    Current Outpatient Medications on File Prior to Visit  Medication Sig Dispense Refill  . aspirin 81 MG tablet Take 81 mg by mouth daily.    [name] Kitchen lisinopril (PRINIVIL,ZESTRIL) 40 MG tablet Take 40 mg by mouth daily.    . mesalamine (APRISO) 0.375 g 24 hr capsule Take 375 mg by mouth daily. 4 daily    . metFORMIN (GLUCOPHAGE) 500 MG tablet Take 1,000 mg by mouth AC breakfast.    . metoprolol tartrate (LOPRESSOR) 25 MG tablet TAKE 1 TABLET BY MOUTH  TWICE DAILY 180 tablet 0  . Multiple Vitamins-Minerals (CENTRUM SILVER 50+MEN) TABS Take 1 tablet by mouth daily.    . nitroGLYCERIN (NITROSTAT) 0.4 MG SL tablet DISSOLVE 1 TABLET UNDER THE TONGUE EVERY 5 MINUTES AS  NEEDED FOR CHEST PAIN. MAX  OF 3 TABLETS IN 15 MINUTES. CALL 911 IF PAIN PERSISTS. 100 tablet 2  . Omega-3 Fatty Acids (FISH OIL) 1200 MG CPDR Take by  mouth.    [name] Kitchen VITAMIN D, CHOLECALCIFEROL, PO Take by mouth daily.      No current facility-administered medications on file prior to visit.     Review of Systems  Constitution: Negative for chills, decreased appetite, malaise/fatigue and weight gain.  Cardiovascular: Positive for chest pain and dyspnea on exertion. Negative for leg swelling and syncope.  Endocrine: Negative for cold intolerance.  Hematologic/Lymphatic: Does not bruise/bleed easily.  Musculoskeletal: Positive for arthritis and joint pain. Negative for joint swelling.  Gastrointestinal: Negative for abdominal pain, anorexia and change in bowel habit.  Neurological: Negative for headaches and light-headedness.  Psychiatric/Behavioral: Negative for depression and substance abuse.  All other systems reviewed and are negative.      Objective:  Blood pressure (!) 149/70, pulse (!) 56, height _0  (1.702 m), weight 204 lb 9.6 oz (92.8 kg), SpO2 98 %. Body mass index is 32.04 kg/m.  Physical Exam  Constitutional: She appears well-developed and well-nourished. No distress.  HENT:  Head: Atraumatic.  Eyes: Conjunctivae are normal.  Neck: Neck supple. No JVD present. No thyromegaly present.  Cardiovascular: Normal rate, regular rhythm, S1 normal, S2 normal and intact distal pulses. Exam reveals no gallop.  Murmur heard.  Harsh early systolic murmur is present with a grade of 2/6 at the upper right sternal border. I  Pulmonary/Chest: Effort normal and breath sounds normal.  Abdominal: Soft. Bowel sounds are normal. A hernia (ventral and small umbilical hernia) is present.  Musculoskeletal: Normal range of motion.        General: No edema.  Neurological: She is alert.  Skin: Skin is warm and dry.  Psychiatric: She has a normal mood and affect.    Radiology: No results found.  Laboratory Examination:  12/02/2017: RBC 5.7, CBC otherwise normal. Creatinine 0.9, EGFR 93/113, potassium 4.7, total protein 8.6, CMP otherwise  normal.  CMP Latest Ref Rng & Units 11/19/2015 11/19/2015  Glucose 70 - 140 mg/dl 202(H) -  BUN 7.0 - 26.0 mg/dL 10.3 -  Creatinine 0.7 - 1.3 mg/dL 0.8 -  Sodium 136 - 145 mEq/L 137 -  Potassium 3.5 - 5.1 mEq/L 3.8 -  CO2 22 - 29 mEq/L 23 -  Calcium 8.4 - 10.4 mg/dL 9.4 -  Total Protein 6.0 - 8.5 g/dL 8.0 7.4  Total Bilirubin 0.20 - 1.20 mg/dL 0.48 -  Alkaline Phos 40 - 150 U/L 67 -  AST 5 - 34 U/L 29 -  ALT 0 - 55 U/L 39 -  CBC Latest Ref Rng & Units 11/19/2015  WBC 4.0 - 10.3 10e3/uL 9.2  Hemoglobin 13.0 - 17.1 g/dL 15.8  Hematocrit 38.4 - 49.9 % 44.7  Platelets 140 - 400 10e3/uL 189   Lipid Panel  No results found for: CHOL, TRIG, HDL, CHOLHDL, VLDL, LDLCALC, LDLDIRECT HEMOGLOBIN A1C No results found for: HGBA1C, MPG TSH No results for input(s): TSH in the last 8760 hours.   Cardiac studies:   Exercise myoview stress 07/08/2018: 1. The patient performed treadmill exercise using Bruce protocol, completing 8:30 minutes. The patient completed an estimated workload of 8.9 METS, reaching 82% of the maximum predicted heart rate. Exercise capacity was normal. Abnormal hemodynamic response was seen- Resting hypertension 162/86 mmHg, without expected increase in blood pressure with peak exercise-166/80 mmHg. Stress symptoms included fatigue. No ischemic changes seen on stress electrocardiogram. 2. The overall quality of the study is good.  Left ventricular cavity is noted to be normal on the rest and stress studies.  Gated SPECT imaging demonstrates hypokinesis of the basal inferior, basal inferolateral, mid inferior and mid inferolateral myocardial wall(s).  The left ventricular ejection fraction was calculated or visually estimated to be 60%.  Moderate sized, medium intensity, predominantly reversible perfusion defect in mid to basal inferior/inferolateral myocardium, suggestive of small infarct with moderate peri infarct ischemia in LCx/PDA territory.  3. High risk study with submaximal  stress.  Echocardiogram  07/07/2018: 1. Left ventricle cavity is normal in size. Normal global wall motion. Doppler evidence of grade I (impaired) diastolic dysfunction, elevated LVEDP/LA pressure. Calculated EF 55%. 2. Mild aortic valve leaflet calcification. Mildly restricted aortic valve leaflets. Trace aortic valve stenosis. Peak gradient 22, mean gradient 10 mmHg, AVA 1.77 cm. No aortic valve regurgitation noted. 3. Trace tricuspid regurgitation. Unable to estimate PA pressure due to absence/minimal TR signal.   Carotid artery duplex  07/07/2018: No hemodynamically significant arterial disease in the internal carotid artery bilaterally. Minimal calcific plaque noted biulateral carotids. Antegrade right vertebral artery flow. Antegrade left vertebral artery flow.  Abdominal Aortic Duplex  07/07/2018 : No AAA observed. Normal iliac artery velocity. Large fluid filled area below naval measuring 7.3 cm.  Appears to be a simple cyst, not well studied. If clinically indicated, consider general US abdomen. See image.  Assessment:      ICD-10-CM   1. Coronary artery calcification seen on CT scan  I25.10   2. Angina pectoris (HCC)  I20.9   3. Dyspnea on exertion  R06.09   4. Essential hypertension  I10 amLODipine (NORVASC) 5 MG tablet    EKG 08/25/2018: Normal sinus rhythm at rate of 70 bpm, left atrial abnormality.  Otherwise normal EKG. No significant change from  EKG 06/02/2018.  Recommendations:    Patient with abnormal nuclear stress test, presently on medical therapy, presents here for her two-month follow-up, was found to have moderate to severe inferior ischemia.  Since last office visit 2-3 months ago, he has lost about 15 pounds in weight, has not had any recurrence of angina and does not use sublingual nitroglycerin.  No significant change in his physical exam.  I again rate related aggressive secondary prevention and "courage trial" data and encouraged him to continue to lose  weight and also get his diabetes under control.  His blood pressure remains elevated although it was normal in PCPs office, advised him to monitor his blood pressure on a regular basis, I have added amlodipine 5 mg daily both for angina and for CAD.  He is not on a statin,  I'll start him on atorvastatin 10 mg daily.  I'll obtain a CMP and lipid profile testing in 6 weeks and see him back in 8 weeks.  If he remains stable I'll see him back in 6 months.  Adrian Prows, MD, [facility] 11/25/2018, 10:53 AM [place] [facility]. [place] Pager: [phone] Office: [phone] If no answer Cell [phone]

## 2018-12-19 ENCOUNTER — Other Ambulatory Visit: Payer: Self-pay | Admitting: Cardiology

## 2018-12-20 ENCOUNTER — Other Ambulatory Visit: Payer: Self-pay

## 2018-12-20 DIAGNOSIS — I10 Essential (primary) hypertension: Secondary | ICD-10-CM

## 2018-12-20 DIAGNOSIS — I209 Angina pectoris, unspecified: Secondary | ICD-10-CM

## 2018-12-20 DIAGNOSIS — I2510 Atherosclerotic heart disease of native coronary artery without angina pectoris: Secondary | ICD-10-CM

## 2018-12-20 MED ORDER — ATORVASTATIN CALCIUM 10 MG PO TABS: 10.0000 mg | ORAL_TABLET | Freq: Every day | ORAL | 3 refills | Status: DC

## 2018-12-20 MED ORDER — AMLODIPINE BESYLATE 5 MG PO TABS: 5.0000 mg | ORAL_TABLET | Freq: Every day | ORAL | 3 refills | Status: DC

## 2018-12-20 MED ORDER — METOPROLOL TARTRATE 25 MG PO TABS: 25.0000 mg | ORAL_TABLET | Freq: Two times a day (BID) | ORAL | 1 refills | Status: DC

## 2018-12-21 ENCOUNTER — Ambulatory Visit: Payer: Medicare Other | Admitting: Cardiology

## 2019-01-04 ENCOUNTER — Other Ambulatory Visit: Payer: Self-pay

## 2019-01-04 ENCOUNTER — Other Ambulatory Visit
Admission: RE | Admit: 2019-01-04 | Discharge: 2019-01-04 | Disposition: A | Discharge status: Home / Self Care | Payer: Medicare Other | Source: Ambulatory Visit | Attending: Gastroenterology | Admitting: Gastroenterology

## 2019-01-04 DIAGNOSIS — Z01812 Encounter for preprocedural laboratory examination: Secondary | ICD-10-CM | POA: Insufficient documentation

## 2019-01-04 DIAGNOSIS — Z20828 Contact with and (suspected) exposure to other viral communicable diseases: Secondary | ICD-10-CM | POA: Diagnosis not present

## 2019-01-04 LAB — SARS CORONAVIRUS 2 (TAT 6-24 HRS): SARS Coronavirus 2: NEGATIVE

## 2019-01-05 ENCOUNTER — Other Ambulatory Visit: Admission: RE | Admit: 2019-01-05 | Payer: Medicare HMO | Source: Ambulatory Visit

## 2019-01-09 ENCOUNTER — Ambulatory Visit: Payer: Medicare Other | Admitting: Anesthesiology

## 2019-01-09 ENCOUNTER — Ambulatory Visit
Admission: RE | Admit: 2019-01-09 | Discharge: 2019-01-09 | Disposition: A | Discharge status: Home / Self Care | Payer: Medicare Other | Attending: Gastroenterology | Admitting: Gastroenterology

## 2019-01-09 ENCOUNTER — Encounter
Admission: RE | Disposition: A | Discharge status: Home / Self Care | Payer: Self-pay | Source: Home / Self Care | Attending: Gastroenterology

## 2019-01-09 ENCOUNTER — Encounter: Payer: Self-pay | Admitting: *Deleted

## 2019-01-09 ENCOUNTER — Other Ambulatory Visit: Payer: Self-pay

## 2019-01-09 DIAGNOSIS — K5190 Ulcerative colitis, unspecified, without complications: Secondary | ICD-10-CM | POA: Diagnosis not present

## 2019-01-09 DIAGNOSIS — Z1211 Encounter for screening for malignant neoplasm of colon: Secondary | ICD-10-CM | POA: Diagnosis not present

## 2019-01-09 DIAGNOSIS — Z87891 Personal history of nicotine dependence: Secondary | ICD-10-CM | POA: Insufficient documentation

## 2019-01-09 DIAGNOSIS — E119 Type 2 diabetes mellitus without complications: Secondary | ICD-10-CM | POA: Insufficient documentation

## 2019-01-09 DIAGNOSIS — K5730 Diverticulosis of large intestine without perforation or abscess without bleeding: Secondary | ICD-10-CM | POA: Insufficient documentation

## 2019-01-09 DIAGNOSIS — K529 Noninfective gastroenteritis and colitis, unspecified: Secondary | ICD-10-CM | POA: Diagnosis not present

## 2019-01-09 DIAGNOSIS — K5790 Diverticulosis of intestine, part unspecified, without perforation or abscess without bleeding: Secondary | ICD-10-CM | POA: Diagnosis not present

## 2019-01-09 DIAGNOSIS — K51918 Ulcerative colitis, unspecified with other complication: Secondary | ICD-10-CM | POA: Diagnosis not present

## 2019-01-09 DIAGNOSIS — K51213 Ulcerative (chronic) proctitis with fistula: Secondary | ICD-10-CM | POA: Diagnosis not present

## 2019-01-09 DIAGNOSIS — I10 Essential (primary) hypertension: Secondary | ICD-10-CM | POA: Insufficient documentation

## 2019-01-09 HISTORY — PX: COLONOSCOPY WITH PROPOFOL: SHX5780

## 2019-01-09 LAB — GLUCOSE, CAPILLARY: Glucose-Capillary: 115 mg/dL — ABNORMAL HIGH (ref 70–99)

## 2019-01-09 SURGERY — COLONOSCOPY WITH PROPOFOL
Anesthesia: General

## 2019-01-09 MED ORDER — PROPOFOL 500 MG/50ML IV EMUL
INTRAVENOUS | Status: AC
  Filled 2019-01-09: qty 50

## 2019-01-09 MED ORDER — EPHEDRINE SULFATE 50 MG/ML IJ SOLN
INTRAMUSCULAR | Status: DC | PRN
  Administered 2019-01-09 (×2): 10 mg via INTRAVENOUS

## 2019-01-09 MED ORDER — SODIUM CHLORIDE 0.9 % IV SOLN
INTRAVENOUS | Status: DC
  Administered 2019-01-09 (×2): via INTRAVENOUS

## 2019-01-09 MED ORDER — FENTANYL CITRATE (PF) 100 MCG/2ML IJ SOLN
INTRAMUSCULAR | Status: DC | PRN
  Administered 2019-01-09: 25 ug via INTRAVENOUS

## 2019-01-09 MED ORDER — LIDOCAINE HCL (PF) 2 % IJ SOLN
INTRAMUSCULAR | Status: AC
  Filled 2019-01-09: qty 10

## 2019-01-09 MED ORDER — PROPOFOL 10 MG/ML IV BOLUS
INTRAVENOUS | Status: AC
  Filled 2019-01-09: qty 20

## 2019-01-09 MED ORDER — PHENYLEPHRINE HCL (PRESSORS) 10 MG/ML IV SOLN
INTRAVENOUS | Status: DC | PRN
  Administered 2019-01-09: 200 ug via INTRAVENOUS
  Administered 2019-01-09 (×2): 100 ug via INTRAVENOUS
  Administered 2019-01-09: 200 ug via INTRAVENOUS

## 2019-01-09 MED ORDER — FENTANYL CITRATE (PF) 100 MCG/2ML IJ SOLN
INTRAMUSCULAR | Status: AC
  Filled 2019-01-09: qty 2

## 2019-01-09 MED ORDER — PHENYLEPHRINE HCL (PRESSORS) 10 MG/ML IV SOLN
INTRAVENOUS | Status: AC
  Filled 2019-01-09: qty 1

## 2019-01-09 MED ORDER — PROPOFOL 500 MG/50ML IV EMUL
INTRAVENOUS | Status: DC | PRN
  Administered 2019-01-09: 180 ug/kg/min via INTRAVENOUS

## 2019-01-09 MED ORDER — EPHEDRINE SULFATE 50 MG/ML IJ SOLN
INTRAMUSCULAR | Status: AC
  Filled 2019-01-09: qty 1

## 2019-01-09 MED ORDER — PROPOFOL 10 MG/ML IV BOLUS
INTRAVENOUS | Status: DC | PRN
  Administered 2019-01-09: 50 mg via INTRAVENOUS

## 2019-01-09 MED ORDER — VASOPRESSIN 20 UNIT/ML IV SOLN
INTRAVENOUS | Status: DC | PRN
  Administered 2019-01-09: 2 [IU] via INTRAVENOUS

## 2019-01-09 NOTE — Anesthesia Post-op Follow-up Note (Signed)
Anesthesia QCDR form completed.        

## 2019-01-09 NOTE — Op Note (Signed)
[facility] Gastroenterology Patient Name: Benjamin Chapman Procedure Date: 01/09/2019 7:31 AM MRN: [id number] Account #: [id number] Date of Birth: 1950-09-19 Admit Type: Outpatient Age: 68 Room: [facility] ENDO ROOM 3 Gender: Male Note Status: Finalized Procedure:            Colonoscopy Indications:          Personal history of inflammatory bowel disease Providers:            Lollie Sails, MD Referring MD:         [name] (Referring MD) Medicines:            Monitored Anesthesia Care Complications:        No immediate complications. Procedure:            Pre-Anesthesia Assessment:                       - ASA Grade Assessment: III - A patient with severe                        systemic disease.                       After obtaining informed consent, the colonoscope was                        passed under direct vision. Throughout the procedure,                        the patient's blood pressure, pulse, and oxygen                        saturations were monitored continuously. The                        Colonoscope was introduced through the anus and                        advanced to the the cecum, identified by appendiceal                        orifice and ileocecal valve. The colonoscopy was                        performed without difficulty. The patient tolerated the                        procedure well. The quality of the bowel preparation                        was good. Findings:      Inflammation characterized by congestion (edema), erosions, erythema,       granularity and deep ulcerations was found as patches of various sizes       surrounded by normal mucosa as patches of various sizes surrounded by       normal mucosa in the rectum, as patches of various sizes surrounded by       normal mucosa in the transverse colon, as patches of various sizes       surrounded by normal mucosa at the cecum and as patches of various sizes   surrounded by normal mucosa in the terminal ileum. No  sites were spared.       This was moderate in severity. Areas between inflammation showed       granular appearing (mild) mucosa. The IC valve was inflamed, ulcerated       and friable , with a gaping opening. Biopsies for histology were taken       with a cold forceps from the cecum, ascending colon, right transverse       colon, left transverse colon, descending colon, sigmoid colon and rectum       for evaluation of microscopic colitis.      Diffuse and patchy inflammation, mild in severity and characterized by       congestion (edema), erosions and friability was found in the distal       ileum. Biopsies were taken with a cold forceps for histology.      The digital rectal exam was normal. There There is the appearance of a       small non-draining fistular opening in the posterior midline externally.       Multiple skin tags. Impression:           - Inflammation was found in the rectum, in the                        transverse colon, at the cecum and in the terminal                        ileum. This was moderate in severity. Biopsied.                       - Crohn's disease. Biopsied. Recommendation:       - Discharge patient to home.                       - Continue present medications.                       - Await pathology results.                       - Return to GI clinic in 10 days. Procedure Code(s):    --- Professional ---                       [phone], Colonoscopy, flexible; with biopsy, single or                        multiple CPT copyright 2019 American Medical Association. All rights reserved. The codes documented in this report are preliminary and upon coder review may  be revised to meet current compliance requirements. Lollie Sails, MD 01/09/2019 8:42:33 AM This report has been signed electronically. Number of Addenda: 0 Note Initiated On: 01/09/2019 7:31 AM Scope Withdrawal Time: 0 hours 18 minutes 52  seconds  Total Procedure Duration: 0 hours 31 minutes 24 seconds       [facility]

## 2019-01-09 NOTE — Transfer of Care (Signed)
Immediate Anesthesia Transfer of Care Note  Patient: Jakeryan Burnet  Procedure(s) Performed: COLONOSCOPY WITH PROPOFOL (N/A )  Patient Location: PACU  Anesthesia Type:General  Level of Consciousness: sedated  Airway & Oxygen Therapy: Patient Spontanous Breathing and Patient connected to nasal cannula oxygen  Post-op Assessment: Report given to RN and Post -op Vital signs reviewed and stable  Post vital signs: Reviewed and stable  Last Vitals:  Vitals Value Taken Time  BP 100/57 01/09/19 0832  Temp    Pulse 69 01/09/19 0832  Resp 14 01/09/19 0832  SpO2 96 % 01/09/19 0832  Vitals shown include unvalidated device data.  Last Pain:  Vitals:   01/09/19 0831  TempSrc:   PainSc: 0-No pain         Complications: No apparent anesthesia complications

## 2019-01-09 NOTE — Anesthesia Procedure Notes (Signed)
Date/Time: 01/09/2019 8:10 AM Performed by: Allean Found, CRNA Pre-anesthesia Checklist: Patient identified, Emergency Drugs available, Suction available, Patient being monitored and Timeout performed [name]-evaluated:[name]-evaluated prior to induction Oxygen Delivery Method: Nasal cannula Placement Confirmation: positive ETCO2

## 2019-01-09 NOTE — Anesthesia Preprocedure Evaluation (Signed)
Anesthesia Evaluation  Patient identified by MRN, date of birth, ID band Patient awake    Reviewed: Allergy & Precautions, H&P , NPO status , Patient's Chart, lab work & pertinent test results, reviewed documented beta blocker date and time   Airway Mallampati: II   Neck ROM: full    Dental  (+) Poor Dentition   Pulmonary shortness of breath and with exertion, Patient abstained from smoking., former smoker,    Pulmonary exam normal        Cardiovascular Exercise Tolerance: Good hypertension, On Medications + angina with exertion Normal cardiovascular exam+ Valvular Problems/Murmurs  Rhythm:regular Rate:Normal  Hx of recent chest pain evaluated by cardiology and stable pattern.  JA   Neuro/Psych negative neurological ROS  negative psych ROS   GI/Hepatic negative GI ROS, Neg liver ROS,   Endo/Other  negative endocrine ROSdiabetes, Well Controlled, Type 2, Oral Hypoglycemic Agents  Renal/GU negative Renal ROS  negative genitourinary   Musculoskeletal   Abdominal   Peds  Hematology negative hematology ROS (+)   Anesthesia Other Findings Past Medical History: No date: Bilateral carotid bruits No date: Diabetes mellitus without complication (HCC) No date: Dyspnea No date: Hypertension No date: Psoriatic arthritis (HCC) No date: Systolic murmur of aorta No date: Ulcerative colitis, acute ([place]) Past Surgical History: No date: KNEE ARTHROSCOPY; Right 1992: MELANOMA EXCISION; N/A     Comment:  back No date: SHOULDER ARTHROSCOPY     Comment:  bi-cept   Reproductive/Obstetrics negative OB ROS                             Anesthesia Physical Anesthesia Plan  ASA: III  Anesthesia Plan: General   Post-op Pain Management:    Induction:   PONV Risk Score and Plan:   Airway Management Planned:   Additional Equipment:   Intra-op Plan:   Post-operative Plan:   Informed Consent: I have  reviewed the patients History and Physical, chart, labs and discussed the procedure including the risks, benefits and alternatives for the proposed anesthesia with the patient or authorized representative who has indicated his/her understanding and acceptance.     Dental Advisory Given  Plan Discussed with: CRNA  Anesthesia Plan Comments:         Anesthesia Quick Evaluation

## 2019-01-09 NOTE — H&P (Signed)
Outpatient short stay form Pre-procedure 01/09/2019 7:39 AM Benjamin Sails MD  Primary Physician: Dr Ashby Dawes  Reason for visit: Colonoscopy  History of present illness: Patient is a 68 year old male presenting today for colonoscopy in regards to his personal history of ulcerative colitis.  He has been continuing to have symptoms with bowel movements 4-5 times a day unformed 2 pieces of stool.  No blood, there is mucus.  There is no abdominal pain.  Patient was previously on Stelara as well as a pre-so.  The Stelara was for his psoriatic arthritis he is now off of that agent.  His arthritic symptoms and rash symptoms are much better.  He tolerated his prep well.  He takes no aspirin or blood thinning agent.  It is of note patient takes metformin.    Current Facility-Administered Medications:  .  0.9 %  sodium chloride infusion, , Intravenous, Continuous, Benjamin Sails, MD, Last Rate: 20 mL/hr at 01/09/19 0710  Medications Prior to Admission  Medication Sig Dispense Refill Last Dose  . amLODipine (NORVASC) 5 MG tablet Take 1 tablet (5 mg total) by mouth daily. 90 tablet 3 01/08/2019 at 0800  . aspirin 81 MG tablet Take 81 mg by mouth daily.   Past Month at Unknown time  . atorvastatin (LIPITOR) 10 MG tablet Take 1 tablet (10 mg total) by mouth daily. 90 tablet 3 01/08/2019 at 0800  . lisinopril (PRINIVIL,ZESTRIL) 40 MG tablet Take 40 mg by mouth daily.   01/08/2019 at 0800  . mesalamine (APRISO) 0.375 g 24 hr capsule Take 375 mg by mouth daily. 4 daily   01/08/2019 at 0800  . metFORMIN (GLUCOPHAGE) 500 MG tablet Take 1,000 mg by mouth AC breakfast.   01/08/2019 at 0800  . metoprolol tartrate (LOPRESSOR) 25 MG tablet Take 1 tablet (25 mg total) by mouth 2 (two) times daily. 180 tablet 1 01/08/2019 at 0800  . Multiple Vitamins-Minerals (CENTRUM SILVER 50+MEN) TABS Take 1 tablet by mouth daily.   Past Week at Unknown time  . nitroGLYCERIN (NITROSTAT) 0.4 MG SL tablet DISSOLVE 1 TABLET UNDER  THE TONGUE EVERY 5 MINUTES AS  NEEDED FOR CHEST PAIN. MAX  OF 3 TABLETS IN 15 MINUTES. CALL 911 IF PAIN PERSISTS. 100 tablet 2 Past Week at Unknown time  . Omega-3 Fatty Acids (FISH OIL) 1200 MG CPDR Take by mouth.   Past Week at Unknown time  . VITAMIN D, CHOLECALCIFEROL, PO Take by mouth daily.    Past Week at Unknown time     Allergies  Allergen Reactions  . Codeine     Nausea   . Remicade [Infliximab]      Past Medical History:  Diagnosis Date  . Bilateral carotid bruits   . Diabetes mellitus without complication ([place])   . Dyspnea   . Hypertension   . Psoriatic arthritis ([place])   . Systolic murmur of aorta   . Ulcerative colitis, acute ([place])     Review of systems:      Physical Exam    Heart and lungs: Regular rate and rhythm without rub or gallop lungs are bilaterally clear    HEENT: Normocephalic atraumatic eyes are anicteric    Other:    Pertinant exam for procedure: Soft nontender nondistended bowel sounds positive normoactive    Planned proceedures: Colonoscopy and indicated procedures. I have discussed the risks benefits and complications of procedures to include not limited to bleeding, infection, perforation and the risk of sedation and the patient wishes to proceed.  Benjamin Sails, MD Gastroenterology 01/09/2019  7:39 AM

## 2019-01-09 NOTE — Anesthesia Postprocedure Evaluation (Signed)
Anesthesia Post Note  Patient: Benjamin Chapman  Procedure(s) Performed: COLONOSCOPY WITH PROPOFOL (N/A )  Patient location during evaluation: PACU Anesthesia Type: General Level of consciousness: awake and alert Pain management: pain level controlled Vital Signs Assessment: post-procedure vital signs reviewed and stable Respiratory status: spontaneous breathing, nonlabored ventilation, respiratory function stable and patient connected to nasal cannula oxygen Cardiovascular status: blood pressure returned to baseline and stable Postop Assessment: no apparent nausea or vomiting Anesthetic complications: no     Last Vitals:  Vitals:   01/09/19 0659 01/09/19 0831  BP: 120/61 (!) 100/57  Pulse: 67 74  Resp: 16 10  Temp: (!) 35.7 C   SpO2: 99% 96%    Last Pain:  Vitals:   01/09/19 0831  TempSrc:   PainSc: 0-No pain                 Molli Barrows

## 2019-01-10 LAB — SURGICAL PATHOLOGY

## 2019-01-11 DIAGNOSIS — I10 Essential (primary) hypertension: Secondary | ICD-10-CM | POA: Diagnosis not present

## 2019-01-11 DIAGNOSIS — I2510 Atherosclerotic heart disease of native coronary artery without angina pectoris: Secondary | ICD-10-CM | POA: Diagnosis not present

## 2019-01-11 DIAGNOSIS — K51919 Ulcerative colitis, unspecified with unspecified complications: Secondary | ICD-10-CM | POA: Diagnosis not present

## 2019-01-12 LAB — CMP14+EGFR
ALT: 17 IU/L (ref 0–44)
AST: 18 IU/L (ref 0–40)
Albumin/Globulin Ratio: 1.6 (ref 1.2–2.2)
Albumin: 4.4 g/dL (ref 3.8–4.8)
Alkaline Phosphatase: 74 IU/L (ref 39–117)
BUN/Creatinine Ratio: 9 — ABNORMAL LOW (ref 10–24)
BUN: 7 mg/dL — ABNORMAL LOW (ref 8–27)
Bilirubin Total: <0.2 mg/dL (ref 0.0–1.2)
CO2: 23 mmol/L (ref 20–29)
Calcium: 9.5 mg/dL (ref 8.6–10.2)
Chloride: 99 mmol/L (ref 96–106)
Creatinine, Ser: 0.82 mg/dL (ref 0.76–1.27)
GFR calc Af Amer: 104 mL/min/{1.73_m2} (ref >59)
GFR calc non Af Amer: 90 mL/min/{1.73_m2} (ref >59)
Globulin, Total: 2.8 g/dL (ref 1.5–4.5)
Glucose: 260 mg/dL — ABNORMAL HIGH (ref 65–99)
Potassium: 4.2 mmol/L (ref 3.5–5.2)
Sodium: 134 mmol/L (ref 134–144)
Total Protein: 7.2 g/dL (ref 6.0–8.5)

## 2019-01-12 LAB — LIPID PANEL WITH LDL/HDL RATIO
Cholesterol, Total: 113 mg/dL (ref 100–199)
HDL: 44 mg/dL (ref >39)
LDL Chol Calc (NIH): 39 mg/dL (ref 0–99)
LDL/HDL Ratio: 0.9 ratio (ref 0.0–3.6)
Triglycerides: 183 mg/dL — ABNORMAL HIGH (ref 0–149)
VLDL Cholesterol Cal: 30 mg/dL (ref 5–40)

## 2019-01-25 DIAGNOSIS — K51919 Ulcerative colitis, unspecified with unspecified complications: Secondary | ICD-10-CM | POA: Diagnosis not present

## 2019-01-25 DIAGNOSIS — K5010 Crohn's disease of large intestine without complications: Secondary | ICD-10-CM | POA: Diagnosis not present

## 2019-01-27 ENCOUNTER — Ambulatory Visit (INDEPENDENT_AMBULATORY_CARE_PROVIDER_SITE_OTHER): Payer: Medicare Other | Admitting: Cardiology

## 2019-01-27 ENCOUNTER — Other Ambulatory Visit: Payer: Self-pay

## 2019-01-27 ENCOUNTER — Encounter: Payer: Self-pay | Admitting: Cardiology

## 2019-01-27 VITALS — BP 128/68 | HR 57 | Ht 67.0 in | Wt 204.0 lb

## 2019-01-27 DIAGNOSIS — E785 Hyperlipidemia, unspecified: Secondary | ICD-10-CM | POA: Diagnosis not present

## 2019-01-27 DIAGNOSIS — I10 Essential (primary) hypertension: Secondary | ICD-10-CM | POA: Diagnosis not present

## 2019-01-27 DIAGNOSIS — R9439 Abnormal result of other cardiovascular function study: Secondary | ICD-10-CM | POA: Diagnosis not present

## 2019-01-27 DIAGNOSIS — I209 Angina pectoris, unspecified: Secondary | ICD-10-CM

## 2019-01-27 NOTE — Progress Notes (Signed)
Subjective:  Primary Physician/Referring:  [name], MD  Patient ID: Benjamin Chapman, adult    DOB: 25-Jan-1951, 68 y.o.   MRN: [id number]  Chief Complaint  Patient presents with  . Chest Pain  . Follow-up    HPI: Benjamin Chapman  is a 68 y.o. adult  with psoriatic arthritis, Hypertension, hyperlipidemia, type 2 diabetes, hypertension, ulcerative colitis, and psoriasis.  Patient has had occasional episodes of chest discomfort and dyspnea on exertion.  In view of his risk factors, he underwent exercise nuclear stress test on 07/08/2018 revealing moderate sized medium intensity perfusion defect in mid to basal inferior/inferolateral wall and was considered high risk study with submaximal stress.    Echocardiogram revealed diastolic dysfunction with trace aortic stenosis.  He was without evidence of abdominal aortic aneurysm.  Carotid duplex did not reveal any hemodynamically significant arterial disease.  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   He has not had recurrence of angina since I last saw him  44month ago.  He has lost about 30 Lbs since seeing [place]. I had started Atorvastatin which he is tolerating,   Past Medical History:  Diagnosis Date  . Bilateral carotid bruits   . Diabetes mellitus without complication ([place]   . Dyspnea   . Hypertension   . Psoriatic arthritis ([place]   . Systolic murmur of aorta   . Ulcerative colitis, acute ([facility]     Past Surgical History:  Procedure Laterality Date  . COLONOSCOPY WITH PROPOFOL N/A 01/09/2019   Procedure: COLONOSCOPY WITH PROPOFOL;  Surgeon: SLollie Sails MD;  [facility]  Service: Endoscopy;  Laterality: N/A;  . KNEE ARTHROSCOPY Right   . MELANOMA EXCISION N/A 1992   back  . SHOULDER ARTHROSCOPY     bi-cept    Social History   Socioeconomic History  . Marital status: Married    Spouse name: Not on file  . Number of children: 2  . Years of  education: Not on file  . Highest education level: Not on file  Occupational History  . Not on file  Social Needs  . Financial resource strain: Not on file  . Food insecurity    Worry: Not on file    Inability: Not on file  . Transportation needs    Medical: Not on file    Non-medical: Not on file  Tobacco Use  . Smoking status: Former Smoker    Packs/day: 1.00    Years: 20.00    Pack years: 20.00    Types: Cigarettes    Quit date: 07/15/1998    Years since quitting: 20.5  . Smokeless tobacco: Never Used  Substance and Sexual Activity  . Alcohol use: Yes    Comment: occasional  . Drug use: Never  . Sexual activity: Never  Lifestyle  . Physical activity    Days per week: Not on file    Minutes per session: Not on file  . Stress: Not on file  Relationships  . Social [place] phone: Not on file    Gets together: Not on file    Attends religious service: Not on file    Active member of club or organization: Not on file    Attends meetings of clubs or organizations: Not on file    Relationship status: Not on file  . Intimate partner violence    Fear of current or ex partner: Not on file  Emotionally abused: Not on file    Physically abused: Not on file    Forced sexual activity: Not on file  Other Topics Concern  . Not on file  Social History Narrative  . Not on file    Current Outpatient Medications on File Prior to Visit  Medication Sig Dispense Refill  . amLODipine (NORVASC) 5 MG tablet Take 1 tablet (5 mg total) by mouth daily. 90 tablet 3  . aspirin 81 MG tablet Take 81 mg by mouth. sometimes    . atorvastatin (LIPITOR) 10 MG tablet Take 1 tablet (10 mg total) by mouth daily. 90 tablet 3  . lisinopril (PRINIVIL,ZESTRIL) 40 MG tablet Take 40 mg by mouth daily.    . mesalamine (APRISO) 0.375 g 24 hr capsule Take 375 mg by mouth daily. 4 daily    . metFORMIN (GLUCOPHAGE) 500 MG tablet Take 1,000 mg by mouth AC breakfast.    . metoprolol tartrate  (LOPRESSOR) 25 MG tablet Take 1 tablet (25 mg total) by mouth 2 (two) times daily. (Patient taking differently: Take 25 mg by mouth daily. ) 180 tablet 1  . Multiple Vitamins-Minerals (CENTRUM SILVER 50+MEN) TABS Take 1 tablet by mouth daily.    . nitroGLYCERIN (NITROSTAT) 0.4 MG SL tablet DISSOLVE 1 TABLET UNDER THE TONGUE EVERY 5 MINUTES AS  NEEDED FOR CHEST PAIN. MAX  OF 3 TABLETS IN 15 MINUTES. CALL 911 IF PAIN PERSISTS. 100 tablet 2   No current facility-administered medications on file prior to visit.     Review of Systems  Constitution: Positive for weight loss (intentional). Negative for chills, decreased appetite, malaise/fatigue and weight gain.  Cardiovascular: Positive for dyspnea on exertion (stable). Negative for chest pain, leg swelling and syncope.  Endocrine: Negative for cold intolerance.  Hematologic/Lymphatic: Does not bruise/bleed easily.  Musculoskeletal: Positive for arthritis and joint pain. Negative for joint swelling.  Gastrointestinal: Negative for abdominal pain, anorexia and change in bowel habit.  Neurological: Negative for headaches and light-headedness.  Psychiatric/Behavioral: Negative for depression and substance abuse.  All other systems reviewed and are negative.      Objective:  Blood pressure 128/68, pulse (!) 57, height '5\' 7"'$  (1.702 m), weight 204 lb (92.5 kg), SpO2 96 %. Body mass index is 31.95 kg/m.  Physical Exam  Constitutional: She appears well-developed and well-nourished. No distress.  HENT:  Head: Atraumatic.  Eyes: Conjunctivae are normal.  Neck: Neck supple. No JVD present. No thyromegaly present.  Cardiovascular: Normal rate, regular rhythm, S1 normal, S2 normal and intact distal pulses. Exam reveals no gallop.  Murmur heard.  Early systolic murmur is present with a grade of 2/6 at the upper right sternal border. I  Pulmonary/Chest: Effort normal and breath sounds normal.  Abdominal: Soft. Bowel sounds are normal. A hernia (ventral  and small umbilical hernia) is present.  Musculoskeletal: Normal range of motion.        General: No edema.  Neurological: She is alert.  Skin: Skin is warm and dry.  Psychiatric: She has a normal mood and affect.    Radiology: No results found.  Laboratory Examination:  12/02/2017: RBC 5.7, CBC otherwise normal. Creatinine 0.9, EGFR 93/113, potassium 4.7, total protein 8.6, CMP otherwise normal.  CMP Latest Ref Rng & Units 01/11/2019 11/19/2015 11/19/2015  Glucose 65 - 99 mg/dL 260(H) 202(H) -  BUN 8 - 27 mg/dL 7(L) 10.3 -  Creatinine 0.76 - 1.27 mg/dL 0.82 0.8 -  Sodium 134 - 144 mmol/L 134 137 -  Potassium 3.5 -  5.2 mmol/L 4.2 3.8 -  Chloride 96 - 106 mmol/L 99 - -  CO2 20 - 29 mmol/L 23 23 -  Calcium 8.6 - 10.2 mg/dL 9.5 9.4 -  Total Protein 6.0 - 8.5 g/dL 7.2 8.0 7.4  Total Bilirubin 0.0 - 1.2 mg/dL <0.2 0.48 -  Alkaline Phos 39 - 117 IU/L 74 67 -  AST 0 - 40 IU/L 18 29 -  ALT 0 - 44 IU/L 17 39 -   CBC Latest Ref Rng & Units 11/19/2015  WBC 4.0 - 10.3 10e3/uL 9.2  Hemoglobin 13.0 - 17.1 g/dL 15.8  Hematocrit 38.4 - 49.9 % 44.7  Platelets 140 - 400 10e3/uL 189   Lipid Panel     Component Value Date/Time   CHOL 113 01/11/2019 0929   TRIG 183 (H) 01/11/2019 0929   HDL 44 01/11/2019 0929  LDL Calc  39  HEMOGLOBIN A1C No results found for: HGBA1C, MPG TSH No results for input(s): TSH in the last 8760 hours.   Cardiac studies:   Exercise myoview stress 07/08/2018: 1. The patient performed treadmill exercise using Bruce protocol, completing 8:30 minutes. The patient completed an estimated workload of 8.9 METS, reaching 82% of the maximum predicted heart rate. Exercise capacity was normal. Abnormal hemodynamic response was seen- Resting hypertension 162/86 mmHg, without expected increase in blood pressure with peak exercise-166/80 mmHg. Stress symptoms included fatigue. No ischemic changes seen on stress electrocardiogram. 2. The overall quality of the study is good.   Left ventricular cavity is noted to be normal on the rest and stress studies.  Gated SPECT imaging demonstrates hypokinesis of the basal inferior, basal inferolateral, mid inferior and mid inferolateral myocardial wall(s).  The left ventricular ejection fraction was calculated or visually estimated to be 60%.  Moderate sized, medium intensity, predominantly reversible perfusion defect in mid to basal inferior/inferolateral myocardium, suggestive of small infarct with moderate peri infarct ischemia in LCx/PDA territory.  3. High risk study with submaximal stress.  Echocardiogram  07/07/2018: 1. Left ventricle cavity is normal in size. Normal global wall motion. Doppler evidence of grade I (impaired) diastolic dysfunction, elevated LVEDP/LA pressure. Calculated EF 55%. 2. Mild aortic valve leaflet calcification. Mildly restricted aortic valve leaflets. Trace aortic valve stenosis. Peak gradient 22, mean gradient 10 mmHg, AVA 1.77 cm. No aortic valve regurgitation noted. 3. Trace tricuspid regurgitation. Unable to estimate PA pressure due to absence/minimal TR signal.   Carotid artery duplex  07/07/2018: No hemodynamically significant arterial disease in the internal carotid artery bilaterally. Minimal calcific plaque noted biulateral carotids. Antegrade right vertebral artery flow. Antegrade left vertebral artery flow.  Abdominal Aortic Duplex  07/07/2018 : No AAA observed. Normal iliac artery velocity. Large fluid filled area below naval measuring 7.3 cm.  Appears to be a simple cyst, not well studied. If clinically indicated, consider general US abdomen. See image.  Assessment:      ICD-10-CM   1. Angina pectoris ([place])  I20.9 EKG 12-Lead  2. Abnormal nuclear stress test  R94.39   3. Essential hypertension  I10     EKG 01/27/2019: Normal sinus rhythm at rate of 58 bpm, normal axis.  Borderline low voltage complexes.  Normal EKG otherwise.  Recommendations:    Patient with abnormal nuclear  stress test, presently on medical therapy, presents here for her two-month follow-up, was found to have moderate to severe inferior ischemia.  Patient is now on Atorvastatin 10 mg daily and lipids under excellent control.  Blood pressure is also under excellent control. He  has not had any recurrence of angina pectoris, he has continued to lose weight and has lost about 30 pounds in weight since he started seeing [place].  His triglycerides are slightly elevated, we have discussed regarding decreasing starch intake.  Stable from cardiac standpoint I'll see him back in 6 months.  Adrian Prows, MD, [facility] 01/27/2019, 9:20 AM [facility] [place] Pager: [phone] Office: [phone] If no answer Cell [phone]

## 2019-01-30 DIAGNOSIS — K51919 Ulcerative colitis, unspecified with unspecified complications: Secondary | ICD-10-CM

## 2019-01-30 DIAGNOSIS — K529 Noninfective gastroenteritis and colitis, unspecified: Secondary | ICD-10-CM

## 2019-01-30 DIAGNOSIS — K50919 Crohn's disease, unspecified, with unspecified complications: Secondary | ICD-10-CM

## 2019-02-16 ENCOUNTER — Other Ambulatory Visit: Payer: Self-pay | Admitting: Cardiology

## 2019-04-13 ENCOUNTER — Encounter: Admit: 2019-04-13 | Discharge: 2019-04-14 | Payer: MEDICARE | Attending: Gastroenterology | Primary: Gastroenterology

## 2019-04-13 DIAGNOSIS — K50919 Crohn's disease, unspecified, with unspecified complications: Principal | ICD-10-CM

## 2019-04-13 DIAGNOSIS — K51919 Ulcerative colitis, unspecified with unspecified complications: Principal | ICD-10-CM

## 2019-04-13 DIAGNOSIS — K529 Noninfective gastroenteritis and colitis, unspecified: Principal | ICD-10-CM

## 2019-04-13 DIAGNOSIS — Z1159 Encounter for screening for other viral diseases: Principal | ICD-10-CM

## 2019-04-13 DIAGNOSIS — R140 Abdominal distension (gaseous): Secondary | ICD-10-CM | POA: Diagnosis not present

## 2019-04-13 DIAGNOSIS — M25559 Pain in unspecified hip: Secondary | ICD-10-CM | POA: Diagnosis not present

## 2019-04-13 DIAGNOSIS — L409 Psoriasis, unspecified: Secondary | ICD-10-CM | POA: Diagnosis not present

## 2019-04-13 MED ORDER — FOLIC ACID 1 MG TABLET
ORAL_TABLET | Freq: Every day | ORAL | 11 refills | 30 days | Status: CP
Start: 2019-04-13 — End: 2020-04-12

## 2019-04-13 MED ORDER — METHOTREXATE SODIUM 25 MG/ML INJECTION SOLUTION
SUBCUTANEOUS | 0 refills | 70 days | Status: CP
Start: 2019-04-13 — End: ?

## 2019-04-13 NOTE — Unmapped (Signed)
[facility]  CONSULT NOTE - INFLAMMATORY BOWEL DISEASE  04/13/2019    Demographics:  Justin Mora is a 68 y.o. year old male    Referring physician:   Stormy Card, PAC  [address]  [facility]  [place],  [place] [address]          HPI / NOTE :     Today, I saw Justin Mora for initial consultation in the [facility] at the request of PA Bridges regarding Indeterminate Colitis.  Outside records including available clinical notes, endoscopy reports, imaging results and pathology results were reviewed in detail as part of this initial consultation.     HPI:  Justin Mora is 68 y.o. male who presents as Initial consult for CD. States currently having lots of flatulence and diarrhea. Having 6-8BM daily. Stools are formed but for forceful. Has some intermittent constipation followed by diarrhea and associated bloating. Has had some incontinence and urgency but states can control BM. Denies hematochezia or rectal bleeding in > 6 months. Denies any abdominal pain. Had colonoscopy 12/2018 showing pancolitis and ileitis. Does take ibuprofen daily for hip pain. Does not use and tobacco products.    Was diagnosed with UC 2018 following start of new biologic medication for psoriasis. Initially had diarrhea and then positive stool testing so underwent colonoscopy. This showed left sided colitis per patient and no therapy was started. Patient was then seen by new GI in 2019 and repeated a colonoscopy that was more consistent with indeterminate colitis and started on Stelara by rheumatologist for arthritis. Loss if insurance led to stopping Stelara in 05/2018 and since then patient has been on Apriso monotherapy.      Review of Systems: positive as per HPI   Otherwise, the balance of 10 systems is negative.          IBD HISTORY:     Year of disease onset:  2018  Diagnosis:  Indeterminate Colitis  Age at onset:  > 39yr old (A3)  Location:  Ileocolonic (L3)  Behavior: Colitis  Perianal Dz:  No    Brief IBD Disease Course:    Diagnosed in 2018 with UC initially. Was taking biologic medication for psoriasis and began to have diarrhea nd rectal bleeding. Had colonoscopy 06/2016 that showed left sided colitis. Was not started on any therapy and switched to new physician who repeated colonoscopy in 2019 which was concerning for Crohn's disease. Had taken Remicade in 2012 for psoraisis with great effect however stopped due to loss of insurance coverage. Then started humira ~2013 without good effect and then restarted on remicade with infusion reaction of hypotension so Humira was restarted. Was started on Stelara in 2019 without much improvement of GI or psorasis symptoms.    Endoscopy:      Had endoscopy in 12/2018 showing pancolitis and ileitis on pathology    Imaging:    n/a    Prior IBD medications (type, dose, duration, response):  [x]  5-ASAs  []  Oral corticosteroids   []  Intravenous corticosteroids  []  Antibiotics  []  Thiopurines  [x]  Methotrexate - Used early in psoriasis disease course. Stopped with physician change  [x]  Anti-TNF therapies - Had been on remicade and humira for Psorasis since 2012. Initially good response to remicade but stopped due to insurance issues. Humira with mild improvement  [x]  Anti-Integrin therapies  []  Anti-Interleukin therapies  []  Anti-JAK therapies  []  Cyclosporine  []  Clinical trial medication  []  Other (Please specify):  Past Medical History:   Past medical history: Crohn's disease, PA, Psoriasis, hx of melanoma on back  Past surgical history: Tendon release, biceps muscel repair,   Family history: Non pertinent.  Social history:   Social History     Socioeconomic History   ??? Marital status: None     Spouse name: None   ??? Number of children: None   ??? Years of education: None   ??? Highest education level: None   Occupational History   ??? None   Social Needs   ??? Financial resource strain: None   ??? Food insecurity     Worry: None     Inability: None   ??? Transportation needs     Medical: None     Non-medical: None   Tobacco Use   ??? Smoking status: Never Smoker   ??? Smokeless tobacco: Never Used   Substance and Sexual Activity   ??? Alcohol use: None   ??? Drug use: None   ??? Sexual activity: None   Lifestyle   ??? Physical activity     Days per week: None     Minutes per session: None   ??? Stress: None   Relationships   ??? Social Wellsite geologist on phone: None     Gets together: None     Attends religious service: None     Active member of club or organization: None     Attends meetings of clubs or organizations: None     Relationship status: None   Other Topics Concern   ??? None   Social History Narrative   ??? None             Allergies:     Allergies   Allergen Reactions   ??? Infliximab Rash     Drop in blood pressure  Sharp back pains     ??? Codeine Dizziness     Nausea  Blurred vision               Medications:     Current Outpatient Medications   Medication Sig Dispense Refill   ??? amLODIPine (NORVASC) 5 MG tablet Take 5 mg by mouth daily.     ??? atorvastatin (LIPITOR) 10 MG tablet Take 10 mg by mouth daily.     ??? cholecalciferol, vitamin D3, 1,000 unit (25 mcg) tablet Take 1,000 Units by mouth once a week.     ??? lisinopriL (PRINIVIL,ZESTRIL) 40 MG tablet Take 40 mg by mouth daily.     ??? metFORMIN (GLUCOPHAGE) 500 MG tablet Take 1,000 mg by mouth 2 (two) times a day.     ??? metoprolol tartrate (LOPRESSOR) 25 MG tablet Take 25 mg by mouth daily.     ??? multivitamin with minerals tablet Take 1 tablet by mouth once a week.     ??? folic acid (FOLVITE) 1 MG tablet Take 1 tablet (1 mg total) by mouth daily. 30 tablet 11   ??? methotrexate 25 mg/mL injection solution Inject 1 mL (25 mg total) under the skin once a week. 10 mL 0     No current facility-administered medications for this visit.              Physical Exam:   BP 152/68  - Pulse 70  - Temp 36.2 ??C  - Wt 93.9 kg (207 lb)  - SpO2 100%     General: Awake,  in no acute distress.   HEENT: Normocephalic, atraumatic. Thyroid: no thyromegaly or  thyroid nodules palpated.  CV: Heart with regular rate.   Lungs: reg respirations, normal breathing pattern.  Abd: ND.  Ext: Warm   Neuro: No gross focal deficits.   Skin: Of normal temperature and texture for the patient's age.          Labs, Data & Indices:     Lab Review:   No results found for: WBC, RBC, HGB  No results found for: AST, ALT, GLUCOSE, BUN, CREATININE, CO2, ALBUMIN, CALCIUM  No results found for: TSH     ............................................................................................................................................[name] Kitchen          Assessment & Recommendations:     Justin Mora is a 68 y.o. male who presents for as new patient consult for indeterminate colitis. Initial colonoscopy concerning for UC, no with pancolitis and possibly backwash ileitis vs CD. Had good response to TNF with regards to psoriasis. Given this and hx of intermediate antibody formation on Humira will plan to start Cimzia with methotrexate subcutaneous weekly. Advised that folic acid must be taken daily while on methotrexate. Recommended flu, pneumonia, and shingles vaccine which patient refused. Will check IBD panel labs, and biologic start labs today. Follow up in 3 months and plan for colonoscopy in 6 months    PLAN:  Patient Instructions   INSTRUCTIONS:     1.  Labs today  2.  Meet with financial advisors regarding medication coverage  3.  Start taking methotrexate weekly injection. Take folic acid supplement daily  4.  We will start process of getting Cimzia approved  5.  Avoid ibuprofen and tobacco  6.  If any trouble or symptoms, do not hesitate to call.     Follow up in 3 months     Catalina Pizza, MD  Division of Gastroenterology & Hepatology  [facility] [place]    Clinical Nurse Contact:  Neta Mends, RN  Ph#  [phone]  Fax#  [phone]    For clinic appointments, please call, [phone], option 1.  For questions regarding radiology appointments, or to schedule, [phone].  For questions regarding scheduling GI procedures (e.g,. Colonoscopy), please call, [phone], option 2.    For educational material and resources:  [email or web address]  ============================================      Patient Education        methotrexate (injection)  Pronunciation:  meth oh TREX ate  Brand:  EmGyn Kit, Methotrexate Sodium, Preservative Free, Otrexup, Rasuvo  What is the most important information I should know about methotrexate injection?  Methotrexate should not be used during pregnancy to treat arthritis or psoriasis. Methotrexate is sometimes used to treat cancer during pregnancy. Tell your doctor if you are pregnant or plan to become pregnant.  Do not use methotrexate to treat psoriasis or rheumatoid arthritis if you have low blood cell counts, a weak immune system, alcoholism or chronic liver disease, or if you are pregnant or breastfeeding.  Methotrexate can cause serious or fatal side effects. Tell your doctor if you have diarrhea, mouth sores, cough, shortness of breath, upper stomach pain, dark urine, numbness or tingling, muscle weakness, confusion, seizure, or skin rash that spreads and causes blistering and peeling.  What is methotrexate injection?  Methotrexate is used to treat leukemia and certain types of cancer of the breast, skin, head and neck, or lung.  Methotrexate is also used to treat severe psoriasis and rheumatoid arthritis in adults, and to treat active polyarticular-course juvenile rheumatoid arthritis in children.  Methotrexate is often used when other medicines have not been effective.  Methotrexate may also be used for purposes not listed in this medication guide.  What should I discuss with my healthcare provider before receiving methotrexate injection?  You should not use methotrexate if you are allergic to it. Methotrexate should not be used to treat psoriasis or rheumatoid arthritis if you have: ?? alcoholism, cirrhosis, or chronic liver disease;  ?? low blood cell counts;  ?? a weak immune system or bone marrow disorder; or  ?? if you are pregnant or breastfeeding.  Methotrexate is sometimes used to treat cancer in people who have a condition listed above.  Your doctor will decide if this treatment is right for you.  Tell your doctor if you have ever had:  ?? kidney disease;  ?? breathing problems;  ?? a stomach ulcer or ulcerative colitis;  ?? any type of infection; or  ?? radiation treatment.  Methotrexate is sometimes used to treat cancer during pregnancy. However, methotrexate may cause injury or death to an unborn baby and should not be used during pregnancy to treat arthritis or psoriasis. Tell your doctor if you are pregnant.  Methotrexate can harm an unborn baby if the mother or the father is using this medicine.   ?? If you are a woman, you may need a pregnancy test before starting this treatment. Use effective birth control to prevent pregnancy while you are using methotrexate and for at least 6 months after your last dose.  ?? If you are a man, use effective birth control if your sex partner is able to get pregnant. Keep using birth control for at least 3 months after your last dose.  ?? Tell your doctor right away if a pregnancy occurs while either the mother or the father is using methotrexate.  This medicine may affect fertility (ability to have children) in both men and women. However, it is important to use birth control to prevent pregnancy because methotrexate may harm the baby if a pregnancy does occur.  Do not breastfeed while using this medicine.    Do not give this medicine to a child without medical advice.  How is methotrexate injection given?  Follow all directions on your prescription label and read all medication guides or instruction sheets. Use the medicine exactly as directed.  Methotrexate is usually given once per week. You must use the correct dose. Some people have died after incorrectly using methotrexate every day.  Methotrexate is injected into a muscle, under the skin, or as an infusion into a vein. A healthcare provider will give you this injection and may teach you how to properly use the medication by yourself.  Methotrexate may also be injected by a healthcare provider directly into a joint, or into the space around your spinal cord.  Read and carefully follow any Instructions for Use provided with your medicine. You may need to mix methotrexate with a liquid (diluent) before using it. Ask your doctor or pharmacist if you don't understand all instructions.  Prepare an injection only when you are ready to give it. Do not use if the medicine looks cloudy, has changed colors, or has particles in it. Call your pharmacist for new medicine.  Methotrexate can be toxic to your organs, and may lower your blood cell counts. You will need frequent medical tests, and you may need an occasional liver biopsy or chest X-ray. Your cancer treatments may be delayed based on the results.  If you need to be sedated for dental work, tell your dentist you currently use methotrexate.  Store methotrexate in its original packaging at room temperature away from moisture, heat, and light. Do not freeze.  A single-use vial (bottle) or auto-injector is for one use only. Use a needle and syringe only once and then place them in a puncture-proof sharps container. Follow state or local laws about how to dispose of this container. Keep it out of the reach of children and pets.  What happens if I miss a dose?  Call your doctor for instructions if you miss an appointment for your methotrexate injection, or if you forget to use the medicine at home.  What happens if I overdose?  Seek emergency medical attention or call the Poison Help line at [phone]. An overdose of methotrexate injection can be fatal.  What should I avoid while receiving methotrexate injection?  Avoid exposure to sunlight or artificial UV rays (sunlamps, tanning beds, or PUVA treatment), especially if you have psoriasis. Methotrexate can make your skin more sensitive to sunlight and your psoriasis may worsen.  Avoid drinking alcohol. It may increase your risk of liver damage.  Do not receive a live vaccine while using methotrexate, or you could develop a serious infection. Live vaccines include measles, mumps, rubella (MMR), rotavirus, typhoid, yellow fever, varicella (chickenpox), zoster (shingles), and nasal flu (influenza) vaccine.  Methotrexate may impair your thinking or reactions. Avoid driving or operating machinery until you know how this medicine will affect you.  What are the possible side effects of methotrexate injection?  Get emergency medical help if you have signs of an allergic reaction (hives, difficult breathing, swelling in your face or throat) or a severe skin reaction (fever, sore throat, burning in your eyes, skin pain, red or purple skin rash that spreads and causes blistering and peeling).  Methotrexate can cause serious or fatal side effects. Call your doctor at once if you have:  ?? sudden chest pain, wheezing, dry cough, cough with mucus, feeling short of breath;  ?? swollen lymph glands, night sweats, weight loss;  ?? blisters or ulcers in your mouth, red or swollen gums, trouble swallowing;  ?? vomiting, diarrhea, blood in your urine or stools;  ?? skin changes such as redness, warmth, swelling, or oozing;  ?? low blood cell counts --fever, chills, bruising or bleeding, pale skin, cold hands and feet, feeling light-headed or short of breath;  ?? kidney problems --little or no urination, swelling in your feet or ankles;  ?? liver problems --swelling around your midsection, right-sided upper stomach pain, nausea, loss of appetite, dark urine, jaundice (yellowing of the skin or eyes);  ?? nerve problems --confusion, weakness, drowsiness, coordination problems, feeling irritable, headache, neck stiffness, vision problems, loss of movement in any part of your body, seizure; or  ?? signs of tumor cell breakdown --tiredness, muscle cramps, nausea, vomiting, diarrhea, fast or slow heart rate, tingling in your hands and feet or around your mouth.  Common side effects may include:  ?? fever, chills, tiredness, not feeling well;  ?? low blood cell counts;  ?? cough, chest tightness, trouble breathing;  ?? mouth sores;  ?? headache, dizziness;  ?? nausea, vomiting, stomach pain, indigestion, diarrhea;  ?? abnormal liver function tests;  ?? runny or stuffy nose, sore throat;  ?? rash, hair loss, burning skin lesions; or  ?? being more sensitive to light.  This is not a complete list of side effects and others may occur. Call your doctor for medical advice about side effects. You may report side effects to FDA at 1-800-FDA-1088.  What  other drugs will affect methotrexate injection?  Methotrexate can harm your liver, especially if you also use certain medicines for infections, tuberculosis, birth control, hormone replacement, high cholesterol, heart problems, high blood pressure, seizures, pain, or arthritis (including Tylenol, Advil, Motrin, or Aleve).  Tell your doctor about all your other medicines, especially:  ?? an antibiotic or sulfa drug;  ?? folic acid;  ?? mercaptopurine;  ?? theophylline; or  ?? stomach acid reducers --esomeprazole, lansoprazole, omeprazole, pantoprazole, Nexium, Prilosec, Protonix, and others.  This list is not complete and many other drugs may affect methotrexate. This includes prescription and over-the-counter medicines, vitamins, and herbal products. Not all possible drug interactions are listed here.  Where can I get more information?  Your doctor or pharmacist can provide more information about methotrexate injection.  Remember, keep this and all other medicines out of the reach of children, never share your medicines with others, and use this medication only for the indication prescribed.   Every effort has been made to ensure that the information provided by [employer] ('[employer]') is accurate, up-to-date, and complete, but no guarantee is made to that effect. Drug information contained herein may be time sensitive. [employer] information has been compiled for use by healthcare practitioners and consumers in the [place] and therefore [employer] does not warrant that uses outside of the [place] are appropriate, unless specifically indicated otherwise. [employer]'s drug information does not endorse drugs, diagnose patients or recommend therapy. [employer]'s drug information is an [employer] corporate to assist licensed healthcare practitioners in caring for their patients and/or to serve consumers viewing this service as a supplement to, and not a substitute for, the expertise, skill, knowledge and judgment of healthcare practitioners. The absence of a warning for a given drug or drug combination in no way should be construed to indicate that the drug or drug combination is safe, effective or appropriate for any given patient. [employer] does not assume any responsibility for any aspect of healthcare administered with the aid of information [employer] provides. The information contained herein is not intended to cover all possible uses, directions, precautions, warnings, drug interactions, allergic reactions, or adverse effects. If you have questions about the drugs you are taking, check with your doctor, nurse or pharmacist.  Copyright [phone] [employer] Version: 5.01. Revision date: 12/19/2018.  Care instructions adapted under license by [facility]. If you have questions about a medical condition or this instruction, always ask your healthcare professional. [employer] disclaims any warranty or liability for your use of this information.       Patient Education        certolizumab  Pronunciation:  SER toe LIZ oo mab  Brand:  Cimzia  What is the most important information I should know about certolizumab? Certolizumab affects your immune system. Serious and sometimes fatal infections may occur.  Your risk of infection may be higher if you have diabetes, HIV, a weak immune system, hepatitis B, chronic infections, if you use certain medications, or if you live in or travel to certain areas.  Call your doctor at once if you have symptoms such as fever, chills, cough, diarrhea, night sweats, flu symptoms, or skin sores.  Using certolizumab may also increase your risk of developing certain types of cancer, including a rare fast-growing type of lymphoma that can be fatal.   What is certolizumab?  Certolizumab is used to treat the symptoms of Crohn's disease after other treatments have failed.  Certolizumab is also used to treat rheumatoid  arthritis, psoriatic arthritis, ankylosing spondylitis, and plaque psoriasis.  Certolizumab may also be used for purposes not listed in this medication guide.  What should I discuss with my healthcare provider before using certolizumab?  You should not use certolizumab if you are allergic to it. You may not be able to use certolizumab if you have symptoms of an infection such as fever, chills, cough, skin sores, shortness of breath, weight loss, diarrhea, or painful urination.  Tell your doctor if you have ever had tuberculosis or if anyone in your household has tuberculosis. Also tell your doctor if you have recently traveled. Tuberculosis and some fungal infections are more common in certain parts of the world, and you may have been exposed during travel.  Certolizumab may cause a rare type of lymphoma (cancer) of the liver, spleen, and bone marrow that can be fatal. This has occurred mainly in teenagers and young men with Crohn's disease or ulcerative colitis. However, anyone with an inflammatory autoimmune disorder may have a higher risk of lymphoma. Talk with your doctor about your own risk.  Tell your doctor if you have ever had:  ?? a chronic infection;  ?? hepatitis B (or if you are a carrier of the virus);  ?? lymphoma or other types of cancer;  ?? a blood cell disorder;  ?? congestive heart failure;  ?? a seizure;  ?? an allergy to latex;  ?? numbness or tingling, or a nervous system disorder such as multiple sclerosis or Guillain-Barr?? syndrome; or  ?? if you are scheduled to receive any vaccines, or have recently been vaccinated with BCG (Bacille Calmette-Guerin).  It is not known whether this medicine will harm an unborn baby. Tell your doctor if you are pregnant. Your name may be listed on a pregnancy registry to track the effects of certolizumab on the baby.  It may not be safe to breast-feed a baby while you are using this medicine. Ask your doctor about any risks.  Certolizumab is not approved for use by anyone younger than 70 years old.  How is certolizumab given?  Your doctor may perform tests to make sure you do not have tuberculosis or other infections.  Follow all directions on your prescription label and read all medication guides or instruction sheets. Use the medicine exactly as directed.  Certolizumab is injected under the skin. A healthcare provider may teach you how to properly use the medication by yourself.  Certolizumab is usually given every 2 to 4 weeks. You may need to use more than 1 injection to get a full dose. Follow your doctor's dosing instructions very carefully.  Read and carefully follow any Instructions for Use provided with your medicine. Ask your doctor or pharmacist if you have questions.  Prepare your injection only when you are ready to give it. Do not use if the medicine looks cloudy, has changed colors, or has particles in it. Call your pharmacist for new medicine.  Certolizumab can increase your risk of bleeding or infection. You will need frequent medical tests.  Store this medicine in its original carton in the refrigerator. Protect from light and do not freeze.  Take the syringe out of the refrigerator and let it reach room temperature before injecting your dose.  Unopened prefilled syringes may also be stored at room temperature for up to 7 days, away from heat and light. Throw away any prefilled syringe not used within 7 days. Do not put it back in the refrigerator.  Each prefilled syringe is for  one use only. Throw away after one use, even if there is still medicine left inside.  Use a needle and syringe only once and then place them in a puncture-proof sharps container. Follow state or local laws about how to dispose of this container. Keep it out of the reach of children and pets.  If you've ever had hepatitis B, using certolizumab can cause this virus to become active or get worse. You may need frequent liver function tests while using this medicine and for several months after you stop.  What happens if I miss a dose?  Call your doctor for instructions if you miss a dose of certolizumab.  What happens if I overdose?  Seek emergency medical attention or call the Poison Help line at [phone].  What should I avoid while receiving certolizumab?  Avoid injecting certolizumab into scars or stretch marks, or into skin that is red, bruised, swollen, hard, or tender.  Ask your doctor before receiving any vaccine while you are being treated with certolizumab.   Avoid being near people who are sick or have infections. Avoid activities that may increase your risk of bleeding or injury.  What are the possible side effects of certolizumab?  Get emergency medical help if you have signs of an allergic reaction: hives; difficulty breathing; swelling of your face, lips, tongue, or throat.  Serious and sometimes fatal infections may occur during treatment with certolizumab. Tell your doctor if you have signs of infection, such as: fever, chills, cough, sweating, muscle pain, open sores or skin wounds, unusual tiredness, feeling short of breath, painful urination, diarrhea, or weight loss.  Call your doctor right away if you have any of these symptoms of lymphoma:  ?? chest pain, cough, feeling short of breath;  ?? swelling in your neck, underarm, or groin (this swelling may come and go);  ?? fever, night sweats, itching, weight loss, feeling tired;  ?? feeling full after eating only a small amount; or  ?? pain in your upper stomach that may spread to your back or shoulder.  Stop using certolizumab and call your doctor at once if you have:  ?? shortness of breath (even with mild exertion), swelling, rapid weight gain;  ?? pale skin, easy bruising or bleeding;  ?? a new growth on your skin (may be red or purple), or any change in the size or color of a mole, freckle, or bump on your skin;  ?? nerve problems --vision problems, dizziness, numbness or tingly feeling, muscle weakness in your arms or legs;  ?? liver problems --loss of appetite, right-sided stomach pain, tiredness, jaundice (yellowing of the skin or eyes); or  ?? new or worsening symptoms of lupus --joint pain, and a skin rash on your cheeks or arms that worsens in sunlight.  Common side effects may include:  ?? pain or burning when you urinate;  ?? rash; or  ?? cold symptoms such as stuffy nose, sneezing, sore throat.  This is not a complete list of side effects and others may occur. Call your doctor for medical advice about side effects. You may report side effects to FDA at 1-800-FDA-1088.  What other drugs will affect certolizumab?  Tell your doctor about all your other medicines, especially:  ?? abatacept;  ?? adalimumab;  ?? anakinra;  ?? etanercept;  ?? golimumab;  ?? infliximab;  ?? natalizumab; or  ?? rituximab.  This list is not complete. Other drugs may affect certolizumab, including prescription and over-the-counter medicines, vitamins, and herbal products. Not  all possible drug interactions are listed here.  Where can I get more information?  Your doctor or pharmacist can provide more information about certolizumab.  Remember, keep this and all other medicines out of the reach of children, never share your medicines with others, and use this medication only for the indication prescribed.   Every effort has been made to ensure that the information provided by [employer] ('[employer]') is accurate, up-to-date, and complete, but no guarantee is made to that effect. Drug information contained herein may be time sensitive. [employer] information has been compiled for use by healthcare practitioners and consumers in the [place] and therefore [employer] does not warrant that uses outside of the [place] are appropriate, unless specifically indicated otherwise. [employer]'s drug information does not endorse drugs, diagnose patients or recommend therapy. [employer]'s drug information is an [employer], corporate to assist licensed healthcare practitioners in caring for their patients and/or to serve consumers viewing this service as a supplement to, and not a substitute for, the expertise, skill, knowledge and judgment of healthcare practitioners. The absence of a warning for a given drug or drug combination in no way should be construed to indicate that the drug or drug combination is safe, effective or appropriate for any given patient. Mult[employer] does not assume any responsibility for any aspect of healthcare administered with the aid of information [employer] provides. The information contained herein is not intended to cover all possible uses, directions, precautions, warnings, drug interactions, allergic reactions, or adverse effects. If you have questions about the drugs you are taking, check with your doctor, nurse or pharmacist.  Copyright [phone] [employer] Version: 17.01. Revision date: 08/16/2017.  Care instructions adapted under license by [facility]. If you have questions about a medical condition or this instruction, always ask your healthcare professional. [employer] disclaims any warranty or liability for your use of this information.             IBD HEALTH MAINTENANCE:    Hep A vaccine status  n/a Hep B vaccine status   Check today  HPV vaccine status (age up to 45 yrs)      n/a  Influenza annual vaccine     Refused by patient  Pneumonia vaccine (q5years if immunocompromised)  Refused by patient  Pap smear (annual if immunocompromised)  n/a  Skin Ca Screen (annual if immunocompromised)  Advised to see annually due to hx of melanoma  Vit D 25-OH level  Check today  DEXA Scan (risk factors - previous fragility fracture, postmenopausal, male >50 years, hypogonadism, or chronic [>3 months] glucocorticoid therapy)  n/a  Quantiferon (if on biologics, annual)   Check today  Colonoscopy   12/2018      The pt was seen and examined with Dr. [name] Mcalpine who agreed with the assessment and plan.    ________________________________  Catalina Pizza, MD  Advanced IBD Fellow  [facility]  Multidisciplinary [facility]

## 2019-04-14 DIAGNOSIS — K50919 Crohn's disease, unspecified, with unspecified complications: Principal | ICD-10-CM

## 2019-04-14 MED ORDER — CIMZIA STARTER KIT 400 MG/2 ML (200 MG/ML X2) SUBCUTANEOUS SYRINGE KIT
0 refills | 0 days | Status: CP
Start: 2019-04-14 — End: ?

## 2019-04-14 MED ORDER — CIMZIA 400 MG/2 ML (200 MG/ML X 2) SUBCUTANEOUS SYRINGE KIT
SUBCUTANEOUS | 6 refills | 28.00000 days | Status: CP
Start: 2019-04-14 — End: ?

## 2019-04-16 DIAGNOSIS — K50919 Crohn's disease, unspecified, with unspecified complications: Principal | ICD-10-CM

## 2019-04-17 DIAGNOSIS — K51919 Ulcerative colitis, unspecified with unspecified complications: Principal | ICD-10-CM

## 2019-04-17 LAB — TB MITOGEN: TB MITOGEN VALUE: 10

## 2019-04-17 LAB — QUANTIFERON ANTIGEN 2 MINUS NIL: Lab: -0.02

## 2019-04-17 LAB — TB AG1 VALUE: Lab: 0.39

## 2019-04-17 LAB — TB MITOGEN VALUE: Lab: 10

## 2019-04-17 LAB — TB AG2 VALUE: Lab: 0.08

## 2019-04-17 LAB — QUANTIFERON TB GOLD PLUS
QUANTIFERON ANTIGEN 1 MINUS NIL: 0.29 [IU]/mL
QUANTIFERON TB GOLD PLUS: NEGATIVE
QUANTIFERON TB NIL VALUE: 0.1 [IU]/mL

## 2019-04-17 LAB — TB NIL VALUE: Lab: 0.1

## 2019-04-17 MED ORDER — SIMPONI 100 MG/ML SUBCUTANEOUS PEN INJECTOR
SUBCUTANEOUS | 11 refills | 28.00000 days | Status: CP
Start: 2019-04-17 — End: 2020-04-16
  Filled 2019-05-09: qty 3, 28d supply, fill #0

## 2019-04-17 MED ORDER — SIMPONI 100 MG/ML SUBCUTANEOUS PEN INJECTOR: 100 mg | mL | 11 refills | 28 days | Status: AC

## 2019-04-17 NOTE — Unmapped (Signed)
[facility] SSC Specialty Medication Onboarding    Specialty Medication: CIMZIA STARTER KIT  Prior Authorization: Not Required   Financial Assistance: No - patient doesn't qualify for additional assistance   Final Copay/Day Supply: $2105.42 / 56 DAYS    Insurance Restrictions: None     Notes to Pharmacist:     The triage team has completed the benefits investigation and has determined that the patient is able to fill this medication at [facility]. Please contact the patient to complete the onboarding or follow up with the prescribing physician as needed.      [facility] SSC Specialty Medication Onboarding    Specialty Medication: CIMZIA 400MG  MAINTENANCE DOSE  Prior Authorization: Not Required   Financial Assistance: No - patient doesn't qualify for additional assistance   Final Copay/Day Supply: $1286.81 / 28 DAYS    Insurance Restrictions: None     Notes to Pharmacist:     The triage team has completed the benefits investigation and has determined that the patient is able to fill this medication at [facility]. Please contact the patient to complete the onboarding or follow up with the prescribing physician as needed.

## 2019-04-18 NOTE — Unmapped (Signed)
Per test claim for Simponi at the [facility], patient needs Medication Assistance Program for Prior Authorization.

## 2019-04-21 NOTE — Unmapped (Signed)
Simponi via MFG program pending

## 2019-05-08 MED ORDER — EMPTY CONTAINER
2 refills | 0 days
Start: 2019-05-08 — End: ?

## 2019-05-08 NOTE — Unmapped (Signed)
[facility] SSC Specialty Medication Onboarding    Specialty Medication: SIMPONI  Prior Authorization: Not Required   Financial Assistance: Yes - manufacture assistance approved via copay card or RX [employer] DRUGS (1102 bucket) as primary   Final Copay/Day Supply: $0 / 28 DAYS    Insurance Restrictions: None     Notes to Pharmacist: Per Referral: *JJPAF is requesting a Medicare attestation to be signed and sent to them before they will extend the approval through next year. Will send this form over for patient's approval to be extended.     The triage team has completed the benefits investigation and has determined that the patient is able to fill this medication at [facility]. Please contact the patient to complete the onboarding or follow up with the prescribing physician as needed.

## 2019-05-08 NOTE — Unmapped (Signed)
[facility]   Patient Onboarding/Medication Counseling    JJPAF approved till end of year, need Medicare Attestation to renew for next year. Attempt to fill before 12/31     [name] is a 68 y.o. male with ulcerative colitis who I am counseling today on initiation of therapy.  I am speaking to the patient.    Was a Nurse, learning disability used for this call? No    Verified patient's date of birth / HIPAA.    Specialty medication(s) to be sent: Inflammatory Disorders: Simponi      Non-specialty medications/supplies to be sent: sharps container      Medications not needed at this time: n/a         Simponi (golimumab)    Medication & Administration     Dosage: Ulcerative colitis: Inject 200mg  at week 0, 100mg  at week 2, then 100mg  every 4 weeks thereafter      Lab tests required prior to treatment initiation:  ? Tuberculosis: Tuberculosis screening resulted in a non-reactive Quantiferon TB Gold assay.  ? Hepatitis B: Hepatitis B serology studies are complete and non-reactive.      Administration:     Prefilled syringe  1. Gather all supplies needed for injection on a clean, flat working surface: medication syringe(s) removed from packaging, alcohol swab, sharps container, etc.  2. Look at the medication label ??? look for correct medication, correct dose, and check the expiration date  3. Look at the medication ??? the liquid in the syringe should appear clear and colorless to slightly yellow and may contain tiny white or clear particles  4. Lay the syringe on a flat surface and allow it to warm up to room temperature for at least 30 minutes  5. Select injection site ??? you can use the front of your thigh or your belly (but not the area 2 inches around your belly button); if someone else is giving you the injection you can also use your upper arm in the skin covering your triceps muscle 6. Prepare injection site ??? wash your hands and clean the skin at the injection site with an alcohol swab and let it air dry, do not touch the injection site again before the injection  7. Pull off the needle safety cap, do not remove until immediately prior to injection  8. Pinch the skin ??? with your hand not holding the syringe pinch up a fold of skin at the injection site using your forefinger and thumb  9. Insert the needle into the fold of skin at about a 45 degree angle ??? it's best to use a quick dart-like motion; once the needle is in place you can release the your pinch and allow the skin at the injection site to relax  10. Push the plunger down slowly as far as it will go until the syringe is empty, if the plunger is not fully depressed the needle shield will not extend to cover the needle when it is removed, hold the syringe in place for a full 5 seconds  11. Check that the syringe is empty and keep pressing down on the plunger while you pull the needle out at the same angle as inserted; after the needle is removed completely from the skin, release the plunger allowing the needle shield to activate and cover the used needle  12. Dispose of the used syringe immediately in your sharps disposal container, do not attempt to recap the needle prior to disposing  13. If you see  any blood at the injection site, press a cotton ball or gauze on the site and maintain pressure until the bleeding stops, do not rub the injection site      Prefilled SmartJect?? auto-injector pen  1. Gather all supplies needed for injection on a clean, flat working surface: medication pen removed from packaging, alcohol swab, sharps container, etc.  2. Look at the medication label ??? look for correct medication, correct dose, and check the expiration date 3. Look at the medication ??? the liquid visible in the window on the side of the pen device should appear clear and colorless to slightly yellow and may contain tiny white or clear particles  4. Lay the auto-injector pen on a flat surface and allow it to warm up to room temperature for at least 30 minutes  5. Select injection site ??? you can use the front of your thigh or your belly (but not the area 2 inches around your belly button); if someone else is giving you the injection you can also use your upper arm in the skin covering your triceps muscle  6. Prepare injection site ??? wash your hands and clean the skin at the injection site with an alcohol swab and let it air dry, do not touch the injection site again before the injection  7. Twist off the safety cap to break the security seal and then pull it straight off, do not remove until immediately prior to injection and do not touch the green needle cover  8. Put the green needle cover against your skin at the injection site at a 90 degree angle, hold the pen such that you can see the clear medication window  9. Push the pen firmly against the injection side so that the green safety cover slides into the clear cover ??? take care not to touch the activation button yet  10. While maintaining downward pressure, press the raised part of the activation button to initiate the injection process; you will hear a click to indicate the injection has started  11. Continue to hold the pen firmly against your skin for about 5-15 seconds ??? the window will start to turn yellow  12. There will be a second click sound when the injection is complete, verify the yellow indicator has stopped moving in the viewing window to confirm the injection is fully complete and then pull the pen away from your skin  13. Dispose of the used auto-injector pen immediately in your sharps disposal container the needle will be covered automatically 14. If you see any blood at the injection site, press a cotton ball or gauze on the site and maintain pressure until the bleeding stops, do not rub the injection site      Adherence/Missed dose instructions:  If your injection is given more than 4 days after your scheduled injection date ??? consult your pharmacist for additional instructions on how to adjust your dosing schedule.      Goals of Therapy       Ulcerative colitis  ? Achieve remission of symptoms  ? Maintain remission of symptoms  ? Minimize long-term systemic glucocorticoid use  ? Prevent need for surgical procedures  ? Maintenance of effective psychosocial functioning      Side Effects & Monitoring Parameters     ? Injection site reaction (redness, irritation, inflammation localized to the site of administration)  ? Skin rash    The following side effects should be reported to the provider:  ? Signs of  a hypersensitivity reaction ??? rash; hives; itching; red, swollen, blistered, or peeling skin; wheezing; tightness in the chest or throat; difficulty breathing, swallowing, or talking; swelling of the mouth, face, lips, tongue, or throat; etc.  ? Reduced immune function ??? report signs of infection such as fever; chills; body aches; very bad sore throat; ear or sinus pain; cough; more sputum or change in color of sputum; pain with passing urine; wound that will not heal, etc.  Also at a slightly higher risk of some malignancies (mainly skin and blood cancers) due to this reduced immune function.  o In the case of signs of infection ??? the patient should hold the next dose of Simponi?? and call your primary care provider to ensure adequate medical care.  Treatment may be resumed when infection is treated and patient is asymptomatic.  ? Signs of lupus-like reaction ??? rash on cheeks, skin photosensitivity, muscle/joint pain, swelling in extremities, etc. ? Signs of unexplained bruising or bleeding ??? throwing up blood or emesis that looks like coffee grounds; black, tarry, or bloody stool; etc.  ? Changes in skin ??? a new growth or lump that forms; changes in shape, size, or color of a previous mole or marking      Contraindications, Warnings, & Precautions     ? Have your bloodwork checked as you have been told by your prescriber  ? Talk with your doctor if you are pregnant, planning to become pregnant, or breastfeeding  ? Discuss the possible need for holding your dose(s) of Simponi?? when a planned procedure is scheduled with the prescriber as it may delay healing/recovery timeline       Drug/Food Interactions     ? Medication list reviewed in Epic. The patient was instructed to inform the care team before taking any new medications or supplements. No drug interactions identified.   ? If you have a latex allergy use caution when handling, the needle cap of the Simponi?? prefilled syringe and the safety cap for the Simponi SmartJect?? pen contains a derivative of natural rubber latex  ? Talk with you prescriber or pharmacist before receiving any live vaccinations while taking this medication and after you stop taking it    Storage, Handling Precautions, & Disposal     ? Store this medication in the refrigerator.  Do not freeze  ? If needed, you may store at room temperature for up to 30 days  ? Store in [employer], protected from light  ? Do not shake  ? Dispose of used syringes/pens in a sharps disposal container        Current Medications (including OTC/herbals), Comorbidities and Allergies     Current Outpatient Medications   Medication Sig Dispense Refill   ??? amLODIPine (NORVASC) 5 MG tablet Take 5 mg by mouth daily.     ??? atorvastatin (LIPITOR) 10 MG tablet Take 10 mg by mouth daily. ??? certolizumab pegoL (CIMZIA STARTER KIT) 400 mg/2 mL (200 mg/mL x 2) SyKt Inject the contents of 2 syringes (400mg ) subcutaneous at week 0,2, and 4 for loading dose. 6 mL 0   ??? certolizumab pegoL (CIMZIA) 400 mg/2 mL (200 mg/mL x 2) SyKt Inject the contents of 2 syringes (400 mg total) under the skin every twenty-eight (28) days. 2 mL 6   ??? cholecalciferol, vitamin D3, 1,000 unit (25 mcg) tablet Take 1,000 Units by mouth once a week.     ??? folic acid (FOLVITE) 1 MG tablet Take 1 tablet (1 mg total) by mouth  daily. 30 tablet 11   ??? golimumab (SIMPONI) 100 mg/mL PnIj Inject the contents of 2 pens (200 mg total) under the skin on day 1. THEN the contents of 1 pen (100 mg total) on day 15. 3 mL 0   ??? golimumab (SIMPONI) 100 mg/mL PnIj Inject the contents of 1 pen (100 mg total) under the skin every twenty-eight (28) days. 1 mL 11   ??? lisinopriL (PRINIVIL,ZESTRIL) 40 MG tablet Take 40 mg by mouth daily.     ??? metFORMIN (GLUCOPHAGE) 500 MG tablet Take 1,000 mg by mouth 2 (two) times a day.     ??? methotrexate 25 mg/mL injection solution Inject 1 mL (25 mg total) under the skin once a week. 10 mL 0   ??? metoprolol tartrate (LOPRESSOR) 25 MG tablet Take 25 mg by mouth daily.     ??? multivitamin with minerals tablet Take 1 tablet by mouth once a week.       No current facility-administered medications for this visit.        Allergies   Allergen Reactions   ??? Infliximab Rash     Drop in blood pressure  Sharp back pains     ??? Codeine Dizziness     Nausea  Blurred vision         There is no problem list on file for this patient.      Reviewed and up to date in Epic.    Appropriateness of Therapy     Is medication and dose appropriate based on diagnosis? Yes    Prescription has been clinically reviewed: Yes    Baseline Quality of Life Assessment      How many days over the past month did your Ulcerative colitis keep you from your normal activities? 0    Financial Information Medication Assistance provided: Manufacturer Assistance    Anticipated copay of $0 reviewed with patient. Verified delivery address.    Delivery Information     Scheduled delivery date: 12/30    Expected start date: 12/30    Medication will be delivered via UPS to the prescription address in [facility].  This shipment will not require a signature.      Explained the services we provide at [facility] and that each month we would call to set up refills.  Stressed importance of returning phone calls so that we could ensure they receive their medications in time each month.  Informed patient that we should be setting up refills 7-10 days prior to when they will run out of medication.  A pharmacist will reach out to perform a clinical assessment periodically.  Informed patient that a welcome packet and a drug information handout will be sent.      Patient verbalized understanding of the above information as well as how to contact the pharmacy at [phone] option 4 with any questions/concerns.  The pharmacy is open Monday through Friday 8:30am-4:30pm.  A pharmacist is available 24/7 via pager to answer any clinical questions they may have.    Patient Specific Needs     ? Does the patient have any physical, cognitive, or cultural barriers? No    ? Patient prefers to have medications discussed with  Patient     ? Is the patient or caregiver able to read and understand education materials at a high school level or above? No    ? Patient's primary language is  English     ? Is the patient high risk?  No     ? Does the patient require a Care Management Plan? No     ? Does the patient require physician intervention or other additional services (i.e. nutrition, smoking cessation, social work)? No      Julianne Rice  [facility] Specialty Pharmacist

## 2019-05-09 MED FILL — SIMPONI 100 MG/ML SUBCUTANEOUS PEN INJECTOR: 28 days supply | Qty: 3 | Fill #0 | Status: AC

## 2019-05-09 MED FILL — EMPTY CONTAINER: 90 days supply | Qty: 1 | Fill #0 | Status: AC

## 2019-05-09 MED FILL — EMPTY CONTAINER: 90 days supply | Qty: 1 | Fill #0

## 2019-05-29 ENCOUNTER — Ambulatory Visit: Payer: Medicare Other | Admitting: Cardiology

## 2019-06-06 DIAGNOSIS — K51919 Ulcerative colitis, unspecified with unspecified complications: Principal | ICD-10-CM

## 2019-06-06 NOTE — Unmapped (Signed)
[facility] Clinical Assessment & Refill Coordination Note    Referral will be submitted for Simponi to try to get MFG assistance.  Next dose is due 2/10. Current copay with Primary is $1939     Justin Mora, DOB: 15-Jun-1950  Phone: [phone] (home)     All above HIPAA information was verified with patient.     Was a Nurse, learning disability used for this call? No    Specialty Medication(s):   Inflammatory Disorders: Simponi     Current Outpatient Medications   Medication Sig Dispense Refill   ??? amLODIPine (NORVASC) 5 MG tablet Take 5 mg by mouth daily.     ??? atorvastatin (LIPITOR) 10 MG tablet Take 10 mg by mouth daily.     ??? cholecalciferol, vitamin D3, 1,000 unit (25 mcg) tablet Take 1,000 Units by mouth once a week.     ??? empty container Misc USE AS DIRECTED 1 each 2   ??? folic acid (FOLVITE) 1 MG tablet Take 1 tablet (1 mg total) by mouth daily. 30 tablet 11   ??? golimumab (SIMPONI) 100 mg/mL PnIj Inject the contents of 2 pens (200 mg total) under the skin on day 1. THEN the contents of 1 pen (100 mg total) on day 15. 3 mL 0   ??? golimumab (SIMPONI) 100 mg/mL PnIj Inject the contents of 1 pen (100 mg total) under the skin every twenty-eight (28) days. 1 mL 11   ??? lisinopriL (PRINIVIL,ZESTRIL) 40 MG tablet Take 40 mg by mouth daily.     ??? metFORMIN (GLUCOPHAGE) 500 MG tablet Take 1,000 mg by mouth 2 (two) times a day.     ??? methotrexate 25 mg/mL injection solution Inject 1 mL (25 mg total) under the skin once a week. 10 mL 0   ??? metoprolol tartrate (LOPRESSOR) 25 MG tablet Take 25 mg by mouth daily.     ??? multivitamin with minerals tablet Take 1 tablet by mouth once a week.       No current facility-administered medications for this visit.         Changes to medications: [name] reports no changes at this time.    Allergies   Allergen Reactions   ??? Infliximab Rash     Drop in blood pressure  Sharp back pains     ??? Codeine Dizziness     Nausea  Blurred vision         Changes to allergies: No SPECIALTY MEDICATION ADHERENCE     Simponi 100 mg/ml: 0 days of medicine on hand     Medication Adherence    Patient reported X missed doses in the last month: 0  Specialty Medication: Simponi 100mg /ml   Patient is on additional specialty medications: No  Informant: patient          Specialty medication(s) dose(s) confirmed: Regimen is correct and unchanged.     Are there any concerns with adherence? No    Adherence counseling provided? Not needed    CLINICAL MANAGEMENT AND INTERVENTION      Clinical Benefit Assessment:    Do you feel the medicine is effective or helping your condition? Yes    Clinical Benefit counseling provided? Not needed    Adverse Effects Assessment:    Are you experiencing any side effects? No    Are you experiencing difficulty administering your medicine? No    Quality of Life Assessment:    How many days over the past month did your Ulcerative colitis  keep you from your normal activities? For example, brushing your teeth or getting up in the morning. Patient declined to answer    Have you discussed this with your provider? Not needed    Therapy Appropriateness:    Is therapy appropriate? Yes, therapy is appropriate and should be continued    DISEASE/MEDICATION-SPECIFIC INFORMATION      For patients on injectable medications: Patient currently has 0 doses left.  Next injection is scheduled for 2/10.    PATIENT SPECIFIC NEEDS     ? Does the patient have any physical, cognitive, or cultural barriers? No    ? Is the patient high risk? No     ? Does the patient require a Care Management Plan? No     ? Does the patient require physician intervention or other additional services (i.e. nutrition, smoking cessation, social work)? No      SHIPPING     Specialty Medication(s) to be Shipped:   Inflammatory Disorders: Simponi    Other medication(s) to be shipped: n/a     Changes to insurance: No    Delivery Scheduled: Yes, Expected medication delivery date: 06/14/18.     Medication will be delivered via UPS to the confirmed prescription address in [facility].    The patient will receive a drug information handout for each medication shipped and additional FDA Medication Guides as required.  Verified that patient has previously received a Conservation officer, historic buildings.    All of the patient's questions and concerns have been addressed.    Julianne Rice   [facility] Specialty Pharmacist

## 2019-06-14 MED FILL — SIMPONI 100 MG/ML SUBCUTANEOUS PEN INJECTOR: SUBCUTANEOUS | 28 days supply | Qty: 1 | Fill #0

## 2019-06-14 MED FILL — SIMPONI 100 MG/ML SUBCUTANEOUS PEN INJECTOR: 28 days supply | Qty: 1 | Fill #0 | Status: AC

## 2019-06-15 DIAGNOSIS — K50919 Crohn's disease, unspecified, with unspecified complications: Principal | ICD-10-CM

## 2019-06-15 NOTE — Unmapped (Signed)
Spoke with pt today as received a refill request for methotrexate.   Called pt to discuss lab monitoring needed for refill.   He wasn't aware of a lab monitoring schedule but has no problem going to [facility] every 12 weeks; however, he  just tested positive for COVID-19 today. He has been feeling sick, achy, cough, and headache which started last Wednesday/Thursday. He did at at home COVID-19 test on Monday and just found out today it is positive. His insurance is actually sending a nurse to his house to administer bamlanivimab (according to patient).   He just took Simponi today 06/15/19 and is due for methotrexate on Saturday. Will see if pt should take methotrexate on Saturday or hold for 2 weeks.   msg sent to [facility] and Dr. [name] Mcalpine       Standing orders sent to [facility].   Will need methotrexate refilled as well.

## 2019-06-28 DIAGNOSIS — K50919 Crohn's disease, unspecified, with unspecified complications: Secondary | ICD-10-CM | POA: Diagnosis not present

## 2019-06-29 LAB — ALT (SGPT): Alanine aminotransferase:CCnc:Pt:Ser/Plas:Qn:: 27

## 2019-06-29 LAB — MEAN CORPUSCULAR HEMOGLOBIN: Erythrocyte mean corpuscular hemoglobin:EntMass:Pt:RBC:Qn:Automated count: 24.9 — ABNORMAL LOW

## 2019-06-29 LAB — CBC
HEMATOCRIT: 39.6 % (ref 37.5–51.0)
HEMOGLOBIN: 12.8 g/dL — ABNORMAL LOW (ref 13.0–17.7)
MEAN CORPUSCULAR HEMOGLOBIN CONC: 32.3 g/dL (ref 31.5–35.7)
RED BLOOD CELL COUNT: 5.15 x10E6/uL (ref 4.14–5.80)
RED CELL DISTRIBUTION WIDTH: 15.7 % — ABNORMAL HIGH (ref 11.6–15.4)
WHITE BLOOD CELL COUNT: 9.4 10*3/uL (ref 3.4–10.8)

## 2019-06-29 LAB — AST: AST (SGOT): 20 IU/L (ref 0–40)

## 2019-06-29 LAB — AST (SGOT): Aspartate aminotransferase:CCnc:Pt:Ser/Plas:Qn:: 20

## 2019-07-05 NOTE — Unmapped (Signed)
[facility] Refill Coordination Note    Specialty Medication(s) to be Shipped:   Inflammatory Disorders: Simponi 100mg /ml     Justin Mora, DOB: Mar 10, 1951  Phone: [phone] (home)     All above HIPAA information was verified with patient.     Was a Nurse, learning disability used for this call? No    Completed refill call assessment today to schedule patient's medication shipment from the [facility] [phone]).       Specialty medication(s) and dose(s) confirmed: Regimen is correct and unchanged.   Changes to medications: [name] reports no changes at this time.  Changes to insurance: No  Questions for the pharmacist: No    Confirmed patient received Welcome Packet with first shipment. The patient will receive a drug information handout for each medication shipped and additional FDA Medication Guides as required.       DISEASE/MEDICATION-SPECIFIC INFORMATION        For patients on injectable medications: Patient currently has 0 doses left.  Next injection is scheduled for 07/17/2019.    SPECIALTY MEDICATION ADHERENCE     Medication Adherence    Patient reported X missed doses in the last month: 0  Specialty Medication: Simponi 100mg /25ml  Patient is on additional specialty medications: No  Informant: patient  Reliability of informant: reliable        Simponi 100mg /49ml: 0 days of medicine on hand   SHIPPING     Shipping address confirmed in Epic.     Delivery Scheduled: Yes, Expected medication delivery date: 07/11/2019.     Medication will be delivered via UPS to the prescription address in Epic WAM.    Disa Riedlinger P [facility] Specialty Technician

## 2019-07-08 DIAGNOSIS — K51919 Ulcerative colitis, unspecified with unspecified complications: Principal | ICD-10-CM

## 2019-07-10 MED ORDER — METHOTREXATE SODIUM 25 MG/ML INJECTION SOLUTION
0 refills | 0 days | Status: CP
Start: 2019-07-10 — End: ?

## 2019-07-10 MED FILL — SIMPONI 100 MG/ML SUBCUTANEOUS PEN INJECTOR: 28 days supply | Qty: 1 | Fill #1 | Status: AC

## 2019-07-10 MED FILL — SIMPONI 100 MG/ML SUBCUTANEOUS PEN INJECTOR: SUBCUTANEOUS | 28 days supply | Qty: 1 | Fill #1

## 2019-07-10 NOTE — Unmapped (Signed)
Labs completed on 06/28/19 and reviewed. Methotrexate refill sent per protocol - Dr. Elizabeth Palau patient.

## 2019-07-13 ENCOUNTER — Institutional Professional Consult (permissible substitution): Admit: 2019-07-13 | Discharge: 2019-07-14 | Payer: MEDICARE | Attending: Gastroenterology | Primary: Gastroenterology

## 2019-07-13 DIAGNOSIS — L409 Psoriasis, unspecified: Secondary | ICD-10-CM | POA: Diagnosis not present

## 2019-07-13 DIAGNOSIS — R933 Abnormal findings on diagnostic imaging of other parts of digestive tract: Secondary | ICD-10-CM | POA: Diagnosis not present

## 2019-07-13 NOTE — Unmapped (Signed)
I spent 30 minutes on the real-time audio and video with the patient on the date of service. I spent an additional 10 minutes on pre- and post-visit activities on the date of service.     The patient was physically located in [place] or a state in which I am permitted to provide care. The patient and/or parent/guardian understood that s/he may incur co-pays and cost sharing, and agreed to the telemedicine visit. The visit was reasonable and appropriate under the circumstances given the patient's presentation at the time.    The patient and/or parent/guardian has been advised of the potential risks and limitations of this mode of treatment (including, but not limited to, the absence of in-person examination) and has agreed to be treated using telemedicine. The patient's/patient's family's questions regarding telemedicine have been answered.     If the visit was completed in an ambulatory setting, the patient and/or parent/guardian has also been advised to contact their provider???s office for worsening conditions, and seek emergency medical treatment and/or call 911 if the patient deems either necessary.    Justin Mora is a 70 y.o. male who presents for as new patient consult for indeterminate colitis. Initial colonoscopy concerning for UC, no with pancolitis and possibly backwash ileitis vs CD. He is in complete clinical remission on Cimzia and methotrexate. He has no complaints today.     ROS     All 10 organ systems reviewed the balance of which was negative.       GEN: no apparent distress  HEENT: PEERL, OP clear with no erythema,   NEURO: , no obvious neurologic abnormality  ABD: nondistended  Extremities:  normal gait  Psych: affect appropriate, A&O x3  SKIN: no visible lesions on face      Justin Mora is a 69 y.o. male with a colonoscopy concerning for UC, no with pancolitis and possibly backwash ileitis vs CD. Had good response to TNF with regards to psoriasis. Given this and hx of intermediate antibody formation on Humira will will continue him on Cimzia with methotrexate subcutaneous weekly given his excellent response. Advised that folic acid must be taken daily while on methotrexate. Recommended flu, pneumonia, and shingles vaccine which patient refused. Will check IBD panel labs, and biologic start labs today. Follow up in 3 months and plan for colonoscopy in August 2021.

## 2019-07-16 ENCOUNTER — Other Ambulatory Visit: Payer: Self-pay | Admitting: Cardiology

## 2019-07-16 DIAGNOSIS — I209 Angina pectoris, unspecified: Secondary | ICD-10-CM

## 2019-07-28 ENCOUNTER — Ambulatory Visit (INDEPENDENT_AMBULATORY_CARE_PROVIDER_SITE_OTHER): Payer: Medicare Other | Admitting: Cardiology

## 2019-07-28 ENCOUNTER — Encounter: Payer: Self-pay | Admitting: Cardiology

## 2019-07-28 ENCOUNTER — Other Ambulatory Visit: Payer: Self-pay

## 2019-07-28 VITALS — BP 145/79 | HR 59 | Temp 97.2°F | Resp 16 | Ht 67.0 in | Wt 214.8 lb

## 2019-07-28 DIAGNOSIS — I2510 Atherosclerotic heart disease of native coronary artery without angina pectoris: Secondary | ICD-10-CM | POA: Diagnosis not present

## 2019-07-28 DIAGNOSIS — R9439 Abnormal result of other cardiovascular function study: Secondary | ICD-10-CM

## 2019-07-28 DIAGNOSIS — E782 Mixed hyperlipidemia: Secondary | ICD-10-CM | POA: Diagnosis not present

## 2019-07-28 DIAGNOSIS — I10 Essential (primary) hypertension: Secondary | ICD-10-CM

## 2019-07-28 DIAGNOSIS — I209 Angina pectoris, unspecified: Secondary | ICD-10-CM | POA: Diagnosis not present

## 2019-07-28 MED ORDER — METOPROLOL TARTRATE 25 MG PO TABS: 12.5000 mg | ORAL_TABLET | Freq: Two times a day (BID) | ORAL | 3 refills | Status: DC

## 2019-07-28 MED ORDER — AMLODIPINE BESYLATE 10 MG PO TABS: 10.0000 mg | ORAL_TABLET | Freq: Every day | ORAL | 0 refills | Status: DC

## 2019-07-28 NOTE — Progress Notes (Signed)
Primary Physician/Referring:  [name], MD  Patient ID: Benjamin Chapman, adult    DOB: [birth year], 69 y.o.   MRN: [id number]  Chief Complaint  Patient presents with  . Chest Pain  . Follow-up    6 month   HPI:    Benjamin Chapman  is a 69 y.o. psoriatic arthritis, Hypertension, hyperlipidemia, type 2 diabetes, hypertension, ulcerative colitis, and psoriasis who presents for a 6 month OV for evaluation of angina pectoris, he has had abnormal stress test 07/08/2018 imaging day moderate size inferolateral wall ischemia considered high risk on a submaximal stress test.  Echocardiogram at that time revealed trace aortic stenosis and mild diastolic dysfunction.  States that since being on aggressive medical therapy, he has not had any further episodes of chest pain and feels well.  He did have Covid 19 in Feb 2021. Has gained about 10 Lbs in weight back, previously had lost about 30 pounds in weight.  He has not had any recurrence of angina pectoris..  Past Medical History:  Diagnosis Date  . Bilateral carotid bruits   . Diabetes mellitus without complication ([place])   . Dyspnea   . Hypertension   . Psoriatic arthritis ([place])   . Systolic murmur of aorta   . Ulcerative colitis, acute [facility])    Past Surgical History:  Procedure Laterality Date  . COLONOSCOPY WITH PROPOFOL N/A 01/09/2019   Procedure: COLONOSCOPY WITH PROPOFOL;  Surgeon: Lollie Sails, MD;  Location[facility] ENDOSCOPY;  Service: Endoscopy;  Laterality: N/A;  . KNEE ARTHROSCOPY Right   . MELANOMA EXCISION N/A 1992   back  . SHOULDER ARTHROSCOPY     bi-cept   Family History  Problem Relation Age of Onset  . Alzheimer's disease Mother   . Stroke Mother   . Diabetes Mother     Social History   Tobacco Use  . Smoking status: Former Smoker    Packs/day: 1.00    Years: 20.00    Pack years: 20.00    Types: Cigarettes    Quit date: 07/15/1998    Years since quitting: 21.0  . Smokeless tobacco: Never Used   Substance Use Topics  . Alcohol use: Yes    Comment: occasional   ROS  Review of Systems  Constitution: Positive for weight gain.  Cardiovascular: Negative for chest pain, dyspnea on exertion and leg swelling.  Musculoskeletal: Positive for arthritis.  Gastrointestinal: Negative for melena.   Objective  Blood pressure (!) 145/79, pulse (!) 59, temperature (!) 97.2 F (36.2 C), temperature source Temporal, resp. rate 16, height 5\' 7"  (1.702 m), weight 214 lb 12.8 oz (97.4 kg), SpO2 96 %.  Vitals with BMI 07/28/2019 01/27/2019 01/09/2019  Height 5\' 7"  5\' 7"  -  Weight 214 lbs 13 oz 204 lbs -  BMI 0000000 [id number] -  Systolic Q000111Q 0000000 [id number]  Diastolic 79 68 63  Pulse 59 57 66     Physical Exam  Constitutional: She appears well-developed and well-nourished. No distress.  Cardiovascular: Normal rate, regular rhythm, S1 normal, S2 normal and intact distal pulses. Exam reveals no gallop.  Murmur heard.  Early systolic murmur is present with a grade of 2/6 at the upper right sternal border. Pulses:      Carotid pulses are on the right side with bruit and on the left side with bruit. No JVD No leg edema  Pulmonary/Chest: Effort normal and breath sounds normal.  Abdominal: Soft. Bowel sounds are normal. A hernia (ventral and small umbilical hernia) is present.  Laboratory examination:   Recent Labs    01/11/19 0929  NA 134  K 4.2  CL 99  CO2 23  GLUCOSE 260*  BUN 7*  CREATININE 0.82  CALCIUM 9.5  GFRNONAA 90  GFRAA 104   CrCl cannot be calculated (Patient's most recent lab result is older than the maximum 21 days allowed.).  CMP Latest Ref Rng & Units 01/11/2019 11/19/2015 11/19/2015  Glucose 65 - 99 mg/dL 260(H) 202(H) -  BUN 8 - 27 mg/dL 7(L) 10.3 -  Creatinine 0.76 - 1.27 mg/dL 0.82 0.8 -  Sodium 134 - 144 mmol/L 134 137 -  Potassium 3.5 - 5.2 mmol/L 4.2 3.8 -  Chloride 96 - 106 mmol/L 99 - -  CO2 20 - 29 mmol/L 23 23 -  Calcium 8.6 - 10.2 mg/dL 9.5 9.4 -  Total Protein 6.0 -  8.5 g/dL 7.2 8.0 7.4  Total Bilirubin 0.0 - 1.2 mg/dL <0.2 0.48 -  Alkaline Phos 39 - 117 IU/L 74 67 -  AST 0 - 40 IU/L 18 29 -  ALT 0 - 44 IU/L 17 39 -   CBC Latest Ref Rng & Units 11/19/2015  WBC 4.0 - 10.3 10e3/uL 9.2  Hemoglobin 13.0 - 17.1 g/dL 15.8  Hematocrit 38.4 - 49.9 % 44.7  Platelets 140 - 400 10e3/uL 189   Lipid Panel     Component Value Date/Time   CHOL 113 01/11/2019 0929   TRIG 183 (H) 01/11/2019 0929   HDL 44 01/11/2019 0929   LDLCALC 39 01/11/2019 0929   External labs:  A1C 7.600 % 10/04/2018 TSH 3.180 10/04/2018  Medications and allergies   Allergies  Allergen Reactions  . Codeine     Nausea   . Remicade [Infliximab]      Current Outpatient Medications  Medication Instructions  . amLODipine (NORVASC) 5 mg, Oral, Daily  . aspirin 81 mg, Oral, sometimes  . atorvastatin (LIPITOR) 10 mg, Oral, Daily  . Golimumab (SIMPONI ) Subcutaneous  . lisinopril (ZESTRIL) 40 mg, Oral, Daily  . metFORMIN (GLUCOPHAGE) 1,000 mg, Oral, (Dosepack) Before breakfast  . methotrexate 250 MG/10ML injection 1 mL, Intramuscular, Weekly  . metoprolol tartrate (LOPRESSOR) 25 MG tablet TAKE 1 TABLET BY MOUTH  TWICE DAILY  . Multiple Vitamins-Minerals (CENTRUM SILVER 50+MEN) TABS 1 tablet, Oral, Daily  . nitroGLYCERIN (NITROSTAT) 0.4 MG SL tablet DISSOLVE 1 TABLET UNDER THE TONGUE EVERY 5 MINUTES AS  NEEDED FOR CHEST PAIN. MAX  OF 3 TABLETS IN 15 MINUTES. CALL 911 IF PAIN PERSISTS.   Radiology:   No results found.  Cardiac Studies:   Exercise myoview stress 07/08/2018: 1. The patient performed treadmill exercise using Bruce protocol, completing 8:30 minutes. The patient completed an estimated workload of 8.9 METS, reaching 82% of the maximum predicted heart rate. Exercise capacity was normal. Abnormal hemodynamic response was seen- Resting hypertension 162/86 mmHg, without expected increase in blood pressure with peak exercise-166/80 mmHg. Stress symptoms included fatigue. No  ischemic changes seen on stress electrocardiogram. 2. The overall quality of the study is good.  Left ventricular cavity is noted to be normal on the rest and stress studies.  Gated SPECT imaging demonstrates hypokinesis of the basal inferior, basal inferolateral, mid inferior and mid inferolateral myocardial wall(s).  The left ventricular ejection fraction was calculated or visually estimated to be 60%.  Moderate sized, medium intensity, predominantly reversible perfusion defect in mid to basal inferior/inferolateral myocardium, suggestive of small infarct with moderate peri infarct ischemia in LCx/PDA territory.  3. High risk  study with submaximal stress.  Echocardiogram  07/07/2018: 1. Left ventricle cavity is normal in size. Normal global wall motion. Doppler evidence of grade I (impaired) diastolic dysfunction, elevated LVEDP/LA pressure. Calculated EF 55%. 2. Mild aortic valve leaflet calcification. Mildly restricted aortic valve leaflets. Trace aortic valve stenosis. Peak gradient 22, mean gradient 10 mmHg, AVA 1.77 cm. No aortic valve regurgitation noted. 3. Trace tricuspid regurgitation. Unable to estimate PA pressure due to absence/minimal TR signal.   Carotid artery duplex  07/07/2018: No hemodynamically significant arterial disease in the internal carotid artery bilaterally. Minimal calcific plaque noted biulateral carotids. Antegrade right vertebral artery flow. Antegrade left vertebral artery flow.  Abdominal Aortic Duplex  07/07/2018 : No AAA observed. Normal iliac artery velocity. Large fluid filled area below naval measuring 7.3 cm.  Appears to be a simple cyst, not well studied. If clinically indicated, consider general US abdomen. See image.  EKG  EKG 07/28/2019: Sinus bradycardia at the rate of 56 bpm, normal axis, no evidence of ischemia.  Otherwise normal EKG. No significant change from EKG 01/27/2019   Assessment     ICD-10-CM   1. Angina pectoris ([place])  I20.9 EKG 12-Lead   2. Coronary artery calcification seen on CT scan  I25.10   3. Abnormal nuclear stress test  R94.39   4. Essential hypertension  I10   5. Mixed hyperlipidemia  E78.2      No orders of the defined types were placed in this encounter.   Medications Discontinued During This Encounter  Medication Reason  . mesalamine (APRISO) 0.375 g 24 hr capsule Discontinued by provider    Recommendations:   Gowtham Buggy  is a 70 y.o. psoriatic arthritis, Hypertension, hyperlipidemia, type 2 diabetes, hypertension, ulcerative colitis, and psoriasis who presents for a 6 month OV for evaluation of angina pectoris, he has had abnormal stress test 07/08/2018 imaging day moderate size inferolateral wall ischemia considered high risk on a submaximal stress test.  Echocardiogram at that time revealed trace aortic stenosis and mild diastolic dysfunction.  Since being on aggressive medical therapy, he has not had any further episodes of chest pain and feels well.    His blood pressure is elevated, suspect this is related to recent weight gain.  I have increased his amlodipine from 5 mg to 10 mg.  I reviewed his external labs, he has not had repeat lipid profile testing but previously had shown mild elevation in triglycerides probably related to uncontrolled diabetes mellitus, he has gained about 10 pounds in weight since last office visit.  That would be his major cardiovascular risk.  I suspect with weight loss his triglycerides will improve so will his diabetes mellitus.  He appears to be motivated, previously had lost 30 pounds in weight.  He has now gained 10 pounds..   He does have bilateral carotid bruit but carotid duplex have been negative except for mild atherosclerosis.  Continue primary prevention.  Advised him to follow-up with his PCP for further management and I will be happy to see him back if he were to develop recurrence of angina pectoris, worsening dyspnea or any other cardiac abnormality.   Adrian Prows, MD, [facility] 07/28/2019, 9:40 AM [facility] [place] Office: [phone]

## 2019-08-03 NOTE — Unmapped (Signed)
[facility] Refill Coordination Note    Specialty Medication(s) to be Shipped:   Inflammatory Disorders: Simponi 100mg /ml     Justin Mora, DOB: 26-Jul-1950  Phone: [phone] (home)     All above HIPAA information was verified with patient.     Was a Nurse, learning disability used for this call? No    Completed refill call assessment today to schedule patient's medication shipment from the [facility] [phone]).       Specialty medication(s) and dose(s) confirmed: Regimen is correct and unchanged.   Changes to medications: Justin Mora reports starting the following medications: methotrexate 25 mg/mL injection solution ( inject 1ml under the skin once a week)  Changes to insurance: No  Questions for the pharmacist: No    Confirmed patient received Welcome Packet with first shipment. The patient will receive a drug information handout for each medication shipped and additional FDA Medication Guides as required.       DISEASE/MEDICATION-SPECIFIC INFORMATION        For patients on injectable medications: Patient currently has 0 doses left.  Next injection is scheduled for 08/14/2019.    SPECIALTY MEDICATION ADHERENCE     Medication Adherence    Patient reported X missed doses in the last month: 0  Specialty Medication: Simponi 100mg /ml  Patient is on additional specialty medications: No  Informant: patient  Reliability of informant: reliable        Simponi 100mg /ml : 0 days of medicine on hand     SHIPPING     Shipping address confirmed in Epic.     Delivery Scheduled: Yes, Expected medication delivery date: 08/09/2019.     Medication will be delivered via UPS to the prescription address in Epic WAM.    Lavin Petteway P [facility] Specialty Technician

## 2019-08-08 MED FILL — SIMPONI 100 MG/ML SUBCUTANEOUS PEN INJECTOR: 28 days supply | Qty: 1 | Fill #2 | Status: AC

## 2019-08-08 MED FILL — SIMPONI 100 MG/ML SUBCUTANEOUS PEN INJECTOR: SUBCUTANEOUS | 28 days supply | Qty: 1 | Fill #2

## 2019-08-31 ENCOUNTER — Telehealth: Payer: Self-pay

## 2019-08-31 NOTE — Unmapped (Signed)
[facility] Refill Coordination Note    Specialty Medication(s) to be Shipped:   Inflammatory Disorders: Simponi 100mg /ml     Justin Mora, DOB: Aug 31, 1950  Phone: [phone] (home)     All above HIPAA information was verified with patient.     Was a Nurse, learning disability used for this call? No    Completed refill call assessment today to schedule patient's medication shipment from the [facility] [phone]).       Specialty medication(s) and dose(s) confirmed: Regimen is correct and unchanged.   Changes to medications: [name] reports no changes at this time.  Changes to insurance: No  Questions for the pharmacist: No    Confirmed patient received Welcome Packet with first shipment. The patient will receive a drug information handout for each medication shipped and additional FDA Medication Guides as required.       DISEASE/MEDICATION-SPECIFIC INFORMATION        For patients on injectable medications: Patient currently has 0 doses left.  Next injection is scheduled for 09/11/2019.    SPECIALTY MEDICATION ADHERENCE     Medication Adherence    Patient reported X missed doses in the last month: 0  Specialty Medication: Simponi 100mg /ml  Patient is on additional specialty medications: No  Informant: patient  Reliability of informant: reliable        Simponi 100mg /ml : 0 days of medicine on hand     SHIPPING     Shipping address confirmed in Epic.     Delivery Scheduled: Yes, Expected medication delivery date: 09/05/2019.     Medication will be delivered via UPS to the prescription address in Epic WAM.    Tishara Pizano P [facility] Specialty Technician

## 2019-09-04 DIAGNOSIS — K50919 Crohn's disease, unspecified, with unspecified complications: Principal | ICD-10-CM

## 2019-09-04 MED FILL — SIMPONI 100 MG/ML SUBCUTANEOUS PEN INJECTOR: 28 days supply | Qty: 1 | Fill #3 | Status: AC

## 2019-09-04 MED FILL — SIMPONI 100 MG/ML SUBCUTANEOUS PEN INJECTOR: SUBCUTANEOUS | 28 days supply | Qty: 1 | Fill #3

## 2019-09-14 NOTE — Telephone Encounter (Signed)
error 

## 2019-09-27 NOTE — Unmapped (Signed)
[facility] Refill Coordination Note    Specialty Medication(s) to be Shipped:   Inflammatory Disorders: Simponi    Other medication(s) to be shipped: N/A     Justin Mora, DOB: 07-May-1951  Phone: [phone] (home)       All above HIPAA information was verified with patient.     Was a Nurse, learning disability used for this call? No    Completed refill call assessment today to schedule patient's medication shipment from the [facility] [phone]).       Specialty medication(s) and dose(s) confirmed: Regimen is correct and unchanged.   Changes to medications: Justin Mora reports no changes at this time.  Changes to insurance: No  Questions for the pharmacist: No    Confirmed patient received Welcome Packet with first shipment. The patient will receive a drug information handout for each medication shipped and additional FDA Medication Guides as required.       DISEASE/MEDICATION-SPECIFIC INFORMATION        For patients on injectable medications: Patient currently has 0 doses left.  Next injection is scheduled for 10/10/19.    SPECIALTY MEDICATION ADHERENCE     Medication Adherence    Patient reported X missed doses in the last month: 0  Specialty Medication: simponi 100mg /ml  Patient is on additional specialty medications: No                Simponi 100 mg/ml: 0 days of medicine on hand         SHIPPING     Shipping address confirmed in Epic.     Delivery Scheduled: Yes, Expected medication delivery date: 10/05/19.     Medication will be delivered via UPS to the prescription address in Epic WAM.    Nancy Nordmann [facility] Specialty Technician

## 2019-09-28 ENCOUNTER — Other Ambulatory Visit: Payer: Self-pay | Admitting: Cardiology

## 2019-09-28 DIAGNOSIS — I10 Essential (primary) hypertension: Secondary | ICD-10-CM

## 2019-10-04 DIAGNOSIS — K51919 Ulcerative colitis, unspecified with unspecified complications: Principal | ICD-10-CM

## 2019-10-04 MED ORDER — METHOTREXATE SODIUM 25 MG/ML INJECTION SOLUTION
0 refills | 0 days
Start: 2019-10-04 — End: ?

## 2019-10-04 MED FILL — SIMPONI 100 MG/ML SUBCUTANEOUS PEN INJECTOR: SUBCUTANEOUS | 28 days supply | Qty: 1 | Fill #4

## 2019-10-04 MED FILL — SIMPONI 100 MG/ML SUBCUTANEOUS PEN INJECTOR: 28 days supply | Qty: 1 | Fill #4 | Status: AC

## 2019-10-06 NOTE — Unmapped (Signed)
Pt due for labs for methotrexate refill. Pt of Dr. [name] Mcalpine.    Sent message to pt that he is overdue for labs and orders updated at [facility]

## 2019-10-11 MED ORDER — METHOTREXATE SODIUM 25 MG/ML INJECTION SOLUTION
0 refills | 0 days
Start: 2019-10-11 — End: ?

## 2019-10-11 NOTE — Unmapped (Signed)
Pt due for labs and was notified on 10/06/19, labs are not completed. Refused medication until labs are done.

## 2019-10-14 DIAGNOSIS — K51919 Ulcerative colitis, unspecified with unspecified complications: Principal | ICD-10-CM

## 2019-10-14 MED ORDER — METHOTREXATE SODIUM 25 MG/ML INJECTION SOLUTION
0 refills | 0 days
Start: 2019-10-14 — End: ?

## 2019-10-16 MED ORDER — METHOTREXATE SODIUM 25 MG/ML INJECTION SOLUTION
0 refills | 0 days
Start: 2019-10-16 — End: ?

## 2019-10-18 DIAGNOSIS — K51919 Ulcerative colitis, unspecified with unspecified complications: Principal | ICD-10-CM

## 2019-10-18 MED ORDER — METHOTREXATE SODIUM 25 MG/ML INJECTION SOLUTION
SUBCUTANEOUS | 0 refills | 84.00000 days | Status: CP
Start: 2019-10-18 — End: 2020-10-17

## 2019-10-18 NOTE — Unmapped (Signed)
-----   Message from Roselie Skinner sent at 10/18/2019 11:30 AM EDT -----  Regarding: RE: Request - Sheikh pt  Contact: [phone]  Tim confirmed he got them done at [facility]. I called and requested these results. Once they have arrived, I will upload them and notify you  Thanks  ----- Message -----  From: Dorisann Frames, RN  Sent: 10/18/2019  10:28 AM EDT  To: Roselie Skinner  Subject: RE: Request [name] Mcalpine pt                          Can you let him know that we cannot refill until we see labs - we do not have results. Can you find out from him where he went and we can go from there?  ----- Message -----  From: Roselie Skinner  Sent: 10/18/2019   9:58 AM EDT  To: Dorisann Frames, RN  Subject: Request - [name] Mcalpine pt                              Chrisandra Carota,    Got a call from this patient and he said he needs methotrexate as he's completely out and he has a shot due Saturday. He also wanted me to let you know he received a text that his labs are due but he said he just had them done recently so he's not exactly sure about that message.    If I can help any further, let me know    Thanks,  Ebone

## 2019-10-18 NOTE — Unmapped (Signed)
Lab results for DOS 10/18/19 received, reviewed, and scanned to [facility] EMR/media tab for our records.   Methotrexate refill authorized

## 2019-10-27 NOTE — Unmapped (Signed)
[facility] Clinical Assessment & Refill Coordination Note    Justin Mora, DOB: [birth year]  Phone: [phone] (home)     All above HIPAA information was verified with patient.     Was a Nurse, learning disability used for this call? No    Specialty Medication(s):   Inflammatory Disorders: Simponi     Current Outpatient Medications   Medication Sig Dispense Refill   ??? amLODIPine (NORVASC) 5 MG tablet Take 5 mg by mouth daily.     ??? atorvastatin (LIPITOR) 10 MG tablet Take 10 mg by mouth daily.     ??? cholecalciferol, vitamin D3, 1,000 unit (25 mcg) tablet Take 1,000 Units by mouth once a week.     ??? empty container Misc USE AS DIRECTED 1 each 2   ??? folic acid (FOLVITE) 1 MG tablet Take 1 tablet (1 mg total) by mouth daily. 30 tablet 11   ??? golimumab (SIMPONI) 100 mg/mL PnIj Inject the contents of 1 pen (100 mg total) under the skin every twenty-eight (28) days. 1 mL 11   ??? lisinopriL (PRINIVIL,ZESTRIL) 40 MG tablet Take 40 mg by mouth daily.     ??? metFORMIN (GLUCOPHAGE) 500 MG tablet Take 1,000 mg by mouth 2 (two) times a day.     ??? methotrexate 25 mg/mL injection solution Inject 1 mL (25 mg total) under the skin every seven (7) days. 12 mL 0   ??? metoprolol tartrate (LOPRESSOR) 25 MG tablet Take 25 mg by mouth daily.     ??? multivitamin with minerals tablet Take 1 tablet by mouth once a week.       No current facility-administered medications for this visit.        Changes to medications: [name] reports no changes at this time.    Allergies   Allergen Reactions   ??? Infliximab Rash     Drop in blood pressure  Sharp back pains     ??? Codeine Dizziness     Nausea  Blurred vision         Changes to allergies: No    SPECIALTY MEDICATION ADHERENCE     Simponi 140 mg/ml: 13 days of medicine on hand       Medication Adherence    Patient reported X missed doses in the last month: 0  Specialty Medication: Simponi 100 mg/ml  Patient is on additional specialty medications: No  Informant: patient  Confirmed plan for next specialty medication refill: delivery by pharmacy  Refills needed for supportive medications: not needed          Specialty medication(s) dose(s) confirmed: Regimen is correct and unchanged.     Are there any concerns with adherence? No    Adherence counseling provided? Not needed    CLINICAL MANAGEMENT AND INTERVENTION      Clinical Benefit Assessment:    Do you feel the medicine is effective or helping your condition? Yes    Clinical Benefit counseling provided? Not needed    Adverse Effects Assessment:    Are you experiencing any side effects? No    Are you experiencing difficulty administering your medicine? No    Quality of Life Assessment:    How many days over the past month did your Ulcerative Colitis  keep you from your normal activities? For example, brushing your teeth or getting up in the morning. 0    Have you discussed this with your provider? Not needed    Therapy Appropriateness:    Is therapy appropriate? Yes,  therapy is appropriate and should be continued    DISEASE/MEDICATION-SPECIFIC INFORMATION      For patients on injectable medications: Patient currently has 0 doses left.  Next injection is scheduled for 11/09/19.    PATIENT SPECIFIC NEEDS     - Does the patient have any physical, cognitive, or cultural barriers? No    - Is the patient high risk? No     - Does the patient require a Care Management Plan? No     - Does the patient require physician intervention or other additional services (i.e. nutrition, smoking cessation, social work)? No      SHIPPING     Specialty Medication(s) to be Shipped:   Inflammatory Disorders: Simponi    Other medication(s) to be shipped: n/a     Changes to insurance: No    Delivery Scheduled: Yes, Expected medication delivery date: 11/02/19.     Medication will be delivered via UPS to the confirmed prescription address in [facility].    The patient will receive a drug information handout for each medication shipped and additional FDA Medication Guides as required.  Verified that patient has previously received a Conservation officer, historic buildings.    All of the patient's questions and concerns have been addressed.    Marya Lowden Vangie Bicker   [facility] Specialty Pharmacist

## 2019-10-30 DIAGNOSIS — E119 Type 2 diabetes mellitus without complications: Secondary | ICD-10-CM | POA: Diagnosis not present

## 2019-10-30 DIAGNOSIS — H353132 Nonexudative age-related macular degeneration, bilateral, intermediate dry stage: Secondary | ICD-10-CM | POA: Diagnosis not present

## 2019-11-01 MED FILL — SIMPONI 100 MG/ML SUBCUTANEOUS PEN INJECTOR: 28 days supply | Qty: 1 | Fill #5 | Status: AC

## 2019-11-01 MED FILL — SIMPONI 100 MG/ML SUBCUTANEOUS PEN INJECTOR: SUBCUTANEOUS | 28 days supply | Qty: 1 | Fill #5

## 2019-11-06 ENCOUNTER — Other Ambulatory Visit: Payer: Self-pay | Admitting: Cardiology

## 2019-11-06 DIAGNOSIS — I2510 Atherosclerotic heart disease of native coronary artery without angina pectoris: Secondary | ICD-10-CM

## 2019-11-27 NOTE — Unmapped (Signed)
[facility] Refill Coordination Note    Specialty Medication(s) to be Shipped:   Inflammatory Disorders: Simponi 100mg /ml     Justin Mora, DOB: 06-21-1950  Phone: [phone] (home)     All above HIPAA information was verified with patient.     Was a Nurse, learning disability used for this call? No    Completed refill call assessment today to schedule patient's medication shipment from the [facility] [phone]).       Specialty medication(s) and dose(s) confirmed: Regimen is correct and unchanged.   Changes to medications: Justin Mora reports no changes at this time.  Changes to insurance: No  Questions for the pharmacist: No    Confirmed patient received Welcome Packet with first shipment. The patient will receive a drug information handout for each medication shipped and additional FDA Medication Guides as required.       DISEASE/MEDICATION-SPECIFIC INFORMATION        For patients on injectable medications: Patient currently has 0 doses left.  Next injection is scheduled for 12/07/2019.    SPECIALTY MEDICATION ADHERENCE     Medication Adherence    Patient reported X missed doses in the last month: 0  Specialty Medication: Simponi 100mg /ml  Patient is on additional specialty medications: No  Informant: patient  Reliability of informant: reliable        Simponi 100mg /ml : 0 days of medicine on hand     SHIPPING     Shipping address confirmed in Epic.     Delivery Scheduled: Yes, Expected medication delivery date: 11/30/2019.     Medication will be delivered via UPS to the prescription address in Epic WAM.    Iana Buzan P [facility] [place] [facility] Specialty Technician

## 2019-11-29 MED FILL — SIMPONI 100 MG/ML SUBCUTANEOUS PEN INJECTOR: 28 days supply | Qty: 1 | Fill #6 | Status: AC

## 2019-11-29 MED FILL — SIMPONI 100 MG/ML SUBCUTANEOUS PEN INJECTOR: SUBCUTANEOUS | 28 days supply | Qty: 1 | Fill #6

## 2019-12-25 DIAGNOSIS — K51919 Ulcerative colitis, unspecified with unspecified complications: Principal | ICD-10-CM

## 2019-12-25 NOTE — Unmapped (Signed)
[facility] Refill Coordination Note    Specialty Medication(s) to be Shipped:   Inflammatory Disorders: Simponi  100mg /ml  Other medication(s) to be shipped: No additional medications requested for fill at this time     Justin Mora, DOB: [birth year]  Phone: [phone] (home)     All above HIPAA information was verified with patient.     Was a Nurse, learning disability used for this call? No    Completed refill call assessment today to schedule patient's medication shipment from the [facility] [phone]).       Specialty medication(s) and dose(s) confirmed: Regimen is correct and unchanged.   Changes to medications: [name] reports no changes at this time.  Changes to insurance: No  Questions for the pharmacist: No    Confirmed patient received Welcome Packet with first shipment. The patient will receive a drug information handout for each medication shipped and additional FDA Medication Guides as required.       DISEASE/MEDICATION-SPECIFIC INFORMATION        For patients on injectable medications: Patient currently has 0 doses left.  Next injection is scheduled for 01/06/2020.    SPECIALTY MEDICATION ADHERENCE     Medication Adherence    Patient reported X missed doses in the last month: 0  Specialty Medication: Simponi 10mg /ml  Patient is on additional specialty medications: No  Informant: patient  Reliability of informant: reliable        Simponi 100mg /ml : 0 days of medicine on hand     SHIPPING     Shipping address confirmed in Epic.     Delivery Scheduled: Yes, Expected medication delivery date: 01/02/2020.     Medication will be delivered via UPS to the prescription address in Epic WAM.    Bejamin Hackbart P [facility] [place] Shared [facility] Specialty Technician

## 2020-01-01 NOTE — Unmapped (Signed)
Justin Mora 's Stelara shipment will be delayed due to Insufficient inventory We have contacted the patient and communicated the delivery change to patient/caregiver We will reschedule the medication for the delivery date that the patient agreed upon. We have confirmed the delivery date as 01/03/20 .

## 2020-01-02 MED FILL — SIMPONI 100 MG/ML SUBCUTANEOUS PEN INJECTOR: 28 days supply | Qty: 1 | Fill #7 | Status: AC

## 2020-01-02 MED FILL — SIMPONI 100 MG/ML SUBCUTANEOUS PEN INJECTOR: SUBCUTANEOUS | 28 days supply | Qty: 1 | Fill #7

## 2020-01-04 DIAGNOSIS — K51919 Ulcerative colitis, unspecified with unspecified complications: Principal | ICD-10-CM

## 2020-01-04 MED ORDER — METHOTREXATE SODIUM 25 MG/ML INJECTION SOLUTION
SUBCUTANEOUS | 0 refills | 28.00000 days | Status: CP
Start: 2020-01-04 — End: 2021-01-03

## 2020-01-25 ENCOUNTER — Encounter: Admit: 2020-01-25 | Discharge: 2020-01-25 | Payer: MEDICARE | Attending: Gastroenterology | Primary: Gastroenterology

## 2020-01-25 ENCOUNTER — Encounter: Admit: 2020-01-25 | Discharge: 2020-01-25 | Payer: MEDICARE

## 2020-01-25 DIAGNOSIS — K51919 Ulcerative colitis, unspecified with unspecified complications: Principal | ICD-10-CM

## 2020-01-25 DIAGNOSIS — K529 Noninfective gastroenteritis and colitis, unspecified: Principal | ICD-10-CM

## 2020-01-25 LAB — IRON: Iron:MCnc:Pt:Ser/Plas:Qn:: 46 — ABNORMAL LOW

## 2020-01-25 LAB — COMPREHENSIVE METABOLIC PANEL
ALBUMIN: 4.1 g/dL (ref 3.4–5.0)
ALKALINE PHOSPHATASE: 77 U/L (ref 46–116)
ANION GAP: 6 mmol/L (ref 5–14)
AST (SGOT): 23 U/L (ref <=34)
BILIRUBIN TOTAL: 0.4 mg/dL (ref 0.3–1.2)
BLOOD UREA NITROGEN: 14 mg/dL (ref 9–23)
BUN / CREAT RATIO: 16
CHLORIDE: 102 mmol/L (ref 98–107)
CO2: 26.6 mmol/L (ref 20.0–31.0)
CREATININE: 0.85 mg/dL
EGFR CKD-EPI AA MALE: >90 mL/min/{1.73_m2} (ref >=60)
EGFR CKD-EPI NON-AA MALE: 88 mL/min/{1.73_m2} (ref >=60)
GLUCOSE RANDOM: 142 mg/dL (ref 70–179)
POTASSIUM: 4.2 mmol/L (ref 3.4–4.5)
PROTEIN TOTAL: 8 g/dL (ref 5.7–8.2)
SODIUM: 135 mmol/L (ref 135–145)

## 2020-01-25 LAB — IRON & TIBC
IRON: 46 ug/dL — ABNORMAL LOW
TOTAL IRON BINDING CAPACITY: 389 ug/dL (ref 250–425)

## 2020-01-25 LAB — SODIUM: Sodium:SCnc:Pt:Ser/Plas:Qn:: 135

## 2020-01-25 LAB — CBC W/ AUTO DIFF
BASOPHILS ABSOLUTE COUNT: 0.1 10*9/L (ref 0.0–0.1)
BASOPHILS RELATIVE PERCENT: 1.4 %
EOSINOPHILS ABSOLUTE COUNT: 0.4 10*9/L (ref 0.0–0.7)
EOSINOPHILS RELATIVE PERCENT: 3.5 %
HEMATOCRIT: 43.2 % (ref 38.0–50.0)
LYMPHOCYTES ABSOLUTE COUNT: 2.1 10*9/L (ref 0.7–4.0)
LYMPHOCYTES RELATIVE PERCENT: 21.3 %
MEAN CORPUSCULAR HEMOGLOBIN CONC: 33.1 g/dL (ref 30.0–36.0)
MEAN CORPUSCULAR HEMOGLOBIN: 28.8 pg (ref 26.0–34.0)
MEAN CORPUSCULAR VOLUME: 87 fL (ref 81.0–95.0)
MEAN PLATELET VOLUME: 7.5 fL (ref 7.0–10.0)
MONOCYTES ABSOLUTE COUNT: 1.2 10*9/L — ABNORMAL HIGH (ref 0.1–1.0)
MONOCYTES RELATIVE PERCENT: 11.7 %
NEUTROPHILS ABSOLUTE COUNT: 6.2 10*9/L (ref 1.7–7.7)
NEUTROPHILS RELATIVE PERCENT: 62.1 %
NUCLEATED RED BLOOD CELLS: 0 /100{WBCs} (ref <=4)
RED BLOOD CELL COUNT: 4.97 10*12/L (ref 4.32–5.72)
RED CELL DISTRIBUTION WIDTH: 14.9 % (ref 12.0–15.0)

## 2020-01-25 LAB — VITAMIN B12: VITAMIN B-12: 871 pg/mL (ref 211–911)

## 2020-01-25 LAB — C-REACTIVE PROTEIN: C reactive protein:MCnc:Pt:Ser/Plas:Qn:: 6

## 2020-01-25 LAB — VITAMIN B-12: Cobalamins:MCnc:Pt:Ser/Plas:Qn:: 871

## 2020-01-25 LAB — EOSINOPHILS ABSOLUTE COUNT: Eosinophils:NCnc:Pt:Bld:Qn:Automated count: 0.4

## 2020-01-25 LAB — FERRITIN: Ferritin:MCnc:Pt:Ser/Plas:Qn:: 41.7

## 2020-01-25 NOTE — Unmapped (Signed)
[facility] Refill Coordination Note    Specialty Medication(s) to be Shipped:   Inflammatory Disorders: Simponi    Other medication(s) to be shipped: No additional medications requested for fill at this time     Justin Mora, DOB: [birth year]  Phone: [phone] (home)       All above HIPAA information was verified with patient.     Was a Nurse, learning disability used for this call? No    Completed refill call assessment today to schedule patient's medication shipment from the [facility] [phone]).       Specialty medication(s) and dose(s) confirmed: Regimen is correct and unchanged.   Changes to medications: [name] reports no changes at this time.  Changes to insurance: No  Questions for the pharmacist: No    Confirmed patient received Welcome Packet with first shipment. The patient will receive a drug information handout for each medication shipped and additional FDA Medication Guides as required.       DISEASE/MEDICATION-SPECIFIC INFORMATION        For patients on injectable medications: Patient currently has 0 doses left.  Next injection is scheduled for 9/28//21.    SPECIALTY MEDICATION ADHERENCE     Medication Adherence    Patient reported X missed doses in the last month: 0  Specialty Medication: Simponi  Patient is on additional specialty medications: No                Simponi 100 mg/ml: 0 days of medicine on hand          SHIPPING     Shipping address confirmed in Epic.     Delivery Scheduled: Yes, Expected medication delivery date: 01/30/20.     Medication will be delivered via UPS to the prescription address in Epic WAM.    Unk Lightning   [facility] Specialty Technician

## 2020-01-25 NOTE — Unmapped (Signed)
Justin Mora is a 69 y.o. male who presents for follow-up for indeterminate colitis. Initial colonoscopy concerning for UC, no with pancolitis and possibly backwash ileitis vs CD. Has has had good response to TNF with regards to psoriasis. 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      He is in complete clinical remission on Simponi every month (last dose 28th Sept) and methotrexate 25 mg/week.     He has no complaints today. He has 2-3 bowel movements/day, consistency depends on what he eats. No abdominal pain. Stable weight- is trying to lose weight. No joint pains. Has chronic insomnia (sometimes only 3 hours/night).    ROS     All 10 organ systems reviewed the balance of which was negative.     GEN: no apparent distress  HEENT: PEERL, OP clear with no erythema,   NEURO: , no obvious neurologic abnormality  ABD: nondistended  Extremities:  normal gait  Psych: affect appropriate, A&O x3  SKIN: no visible lesions on face    Justin Mora is a 69 y.o. male with indeterminate colitis. Currently on Simponi + methotrexate 25 mg/ subcutaneous weekly. Doing very well from a IBD + psoriatic arthritis perspective. Last colonoscopy 12/2018 prior to starting combo therapy showed pancolitis and ileitis. Will re-scope later this fall for disease activity & assess response to therapy.    PLAN:  1. Labs today.  2. Continue methotrexate & Simponi.  3. Continue folic acid.  4. Colonoscopy this fall, to assess response.  5. Vaccines due:  - Covid booster Proofreader) today  - Pneumonia vaccines, flu vaccine  6. Speak to your PCP about your sleeping issues.  7. Follow-up in 6 months.    Patient seen & discussed with Dr. [name] Mcalpine.    Cliffton Asters, MD  IBD Fellow  [facility]  Multidisciplinary Inflammatory Bowel Disease Clinic

## 2020-01-25 NOTE — Unmapped (Addendum)
1. Labs today.  2. Continue methotrexate & Simponi.  3. Continue folic acid.  4. Colonoscopy this fall, to assess response.  5. Vaccines due:  - Covid booster Proofreader) today  - Pneumonia vaccines, flu vaccine  6. Speak to your PCP about your sleeping issues.  7. Follow-up in 6 months.      ------    APPOINTMENT SCHEDULING FOR GI CLINIC AND GI PROCEDURES:  GI MEDICINE CLINIC  [phone] option 1     GI PROCEDURES         [phone] option 2   *To schedule, reschedule, or cancel your GI appointment, please call [phone]. If you are unable to come to an appointment, please notify us as soon as possible, preferably 24 hours in advance. Doing so may allow other patients with urgent needs to be scheduled in a cancelled appointment slot.     RADIOLOGY - to schedule your imaging procedure, please call [phone] opt 1     IBD NURSE COORDINATOR CONTACT ??? Karie Mainland, BSN RN  Phone: [phone] (direct line)   Fax: [phone]  * For urgent medical concerns after hours or on weekends and holidays, call [phone] and ask to speak to the GI Fellow on call.    * If you have a GI medical question or GI symptoms and would like to speak to your provider's healthcare team, please contact Karie Mainland (contact information above) OR you can send the GI healthcare team a message through MyChart at [email or web address]    TEST RESULTS   If you have a MyChart account, your new results will be sent to you through your MyChart account at [email or web address]. For results that require follow-up, a member of your healthcare team will also contact you directly.    PRESCRIPTION REFILL REQUESTS  To request prescription refills, please contact your pharmacy or send your healthcare team a message through your MyChart account at [email or web address]    RECORD REQUESTS  For questions related to medical records, please call Medical Records Release of Information at [phone]    FINANCIAL COUNSELOR   For billing and other financial questions/needs ??? please contact Mee Hives at [phone]. If you need to leave a message, please be sure to leave your full name, date of birth or MR#, best call back # and reason for call.

## 2020-01-29 LAB — VITAMIN D, TOTAL (25OH): Lab: 39.7

## 2020-01-29 MED FILL — SIMPONI 100 MG/ML SUBCUTANEOUS PEN INJECTOR: SUBCUTANEOUS | 28 days supply | Qty: 1 | Fill #8

## 2020-01-29 MED FILL — SIMPONI 100 MG/ML SUBCUTANEOUS PEN INJECTOR: 28 days supply | Qty: 1 | Fill #8 | Status: AC

## 2020-02-01 ENCOUNTER — Other Ambulatory Visit: Payer: Self-pay | Admitting: Cardiology

## 2020-02-06 ENCOUNTER — Other Ambulatory Visit: Payer: Self-pay

## 2020-02-06 DIAGNOSIS — K51919 Ulcerative colitis, unspecified with unspecified complications: Principal | ICD-10-CM

## 2020-02-06 DIAGNOSIS — I2510 Atherosclerotic heart disease of native coronary artery without angina pectoris: Secondary | ICD-10-CM

## 2020-02-06 MED ORDER — METHOTREXATE SODIUM 25 MG/ML INJECTION SOLUTION
SUBCUTANEOUS | 3 refills | 28.00000 days | Status: CP
Start: 2020-02-06 — End: 2021-02-05

## 2020-02-06 MED ORDER — LISINOPRIL 40 MG PO TABS: 40.0000 mg | ORAL_TABLET | Freq: Every day | ORAL | 3 refills | Status: DC

## 2020-02-06 MED ORDER — ATORVASTATIN CALCIUM 10 MG PO TABS: 10.0000 mg | ORAL_TABLET | Freq: Every day | ORAL | 3 refills | Status: DC

## 2020-02-23 NOTE — Unmapped (Signed)
[facility] Refill Coordination Note    Specialty Medication(s) to be Shipped:   Inflammatory Disorders: Simponi 100mg /ml  Other medication(s) to be shipped: No additional medications requested for fill at this time     Justin Mora, DOB: 10-Jun-1950  Phone: [phone] (home)     All above HIPAA information was verified with patient.     Was a Nurse, learning disability used for this call? No    Completed refill call assessment today to schedule patient's medication shipment from the [facility] [phone]).       Specialty medication(s) and dose(s) confirmed: Regimen is correct and unchanged.   Changes to medications: [name] reports no changes at this time.  Changes to insurance: No  Questions for the pharmacist: No    Confirmed patient received Welcome Packet with first shipment. The patient will receive a drug information handout for each medication shipped and additional FDA Medication Guides as required.       DISEASE/MEDICATION-SPECIFIC INFORMATION        For patients on injectable medications: Patient currently has 0 doses left.  Next injection is scheduled for 03/01/2020.    SPECIALTY MEDICATION ADHERENCE     Medication Adherence    Patient reported X missed doses in the last month: 0  Specialty Medication: Simponi 100mg /ml  Patient is on additional specialty medications: No  Informant: patient  Reliability of informant: reliable        Simponi 100mg /ml : 0 days of medicine on hand     SHIPPING     Shipping address confirmed in Epic.     Delivery Scheduled: Yes, Expected medication delivery date: 02/27/2020.     Medication will be delivered via UPS to the prescription address in Epic WAM.    Loranzo Desha P [facility] [place] Shared [facility] Specialty Technician

## 2020-02-26 DIAGNOSIS — K51919 Ulcerative colitis, unspecified with unspecified complications: Principal | ICD-10-CM

## 2020-02-26 MED FILL — SIMPONI 100 MG/ML SUBCUTANEOUS PEN INJECTOR: SUBCUTANEOUS | 28 days supply | Qty: 1 | Fill #9

## 2020-02-26 MED FILL — SIMPONI 100 MG/ML SUBCUTANEOUS PEN INJECTOR: 28 days supply | Qty: 1 | Fill #9 | Status: AC

## 2020-02-28 MED ORDER — POLYETHYLENE GLYCOL 3350 17 GRAM/DOSE ORAL POWDER
Freq: Once | ORAL | 0 refills | 1.00000 days | Status: CP
Start: 2020-02-28 — End: 2020-02-29
  Filled 2020-02-29: qty 238, 1d supply, fill #0

## 2020-02-28 MED ORDER — POLYETHYLENE GLYCOL 3350 17 GRAM/DOSE ORAL POWDER: 119 g | g | Freq: Once | 0 refills | 1 days | Status: AC

## 2020-02-28 MED ORDER — SIMETHICONE 125 MG CHEWABLE TABLET
ORAL_TABLET | ORAL | 0 refills | 0.00000 days | Status: CP
Start: 2020-02-28 — End: ?
  Filled 2020-02-29: qty 4, 2d supply, fill #0

## 2020-02-28 MED ORDER — BISACODYL 5 MG TABLET,DELAYED RELEASE
ORAL_TABLET | Freq: Once | ORAL | 0 refills | 1.00000 days | Status: CP
Start: 2020-02-28 — End: 2020-02-29
  Filled 2020-02-29: qty 2, 1d supply, fill #0
  Filled 2020-02-29: qty 119, 1d supply, fill #0

## 2020-02-29 MED FILL — BISACODYL 5 MG TABLET,DELAYED RELEASE: 1 days supply | Qty: 2 | Fill #0 | Status: AC

## 2020-02-29 MED FILL — CLEARLAX 17 GRAM/DOSE ORAL POWDER: 1 days supply | Qty: 119 | Fill #0 | Status: AC

## 2020-02-29 MED FILL — SIMETHICONE 125 MG CHEWABLE TABLET: 2 days supply | Qty: 4 | Fill #0 | Status: AC

## 2020-02-29 MED FILL — POLYETHYLENE GLYCOL 3350 17 GRAM/DOSE ORAL POWDER: 1 days supply | Qty: 238 | Fill #0 | Status: AC

## 2020-03-18 DIAGNOSIS — K51919 Ulcerative colitis, unspecified with unspecified complications: Principal | ICD-10-CM

## 2020-03-20 NOTE — Unmapped (Signed)
[facility] Shared [facility] Pharmacy Clinical Assessment & Refill Coordination Note        Army Melia, DOB: Apr 17, 1951  Phone: [phone] (home)     All above HIPAA information was verified with patient.     Was a Nurse, learning disability used for this call? No    Specialty Medication(s):   Inflammatory Disorders: Simponi     Current Outpatient Medications   Medication Sig Dispense Refill   ??? amLODIPine (NORVASC) 5 MG tablet Take 5 mg by mouth daily.     ??? atorvastatin (LIPITOR) 10 MG tablet Take 10 mg by mouth daily.     ??? cholecalciferol, vitamin D3, 1,000 unit (25 mcg) tablet Take 1,000 Units by mouth once a week.     ??? empty container Misc USE AS DIRECTED 1 each 2   ??? folic acid (FOLVITE) 1 MG tablet Take 1 tablet (1 mg total) by mouth daily. 30 tablet 11   ??? golimumab (SIMPONI) 100 mg/mL PnIj Inject the contents of 1 pen (100 mg total) under the skin every twenty-eight (28) days. 1 mL 11   ??? lisinopriL (PRINIVIL,ZESTRIL) 40 MG tablet Take 40 mg by mouth daily.     ??? metFORMIN (GLUCOPHAGE) 500 MG tablet Take 1,000 mg by mouth 2 (two) times a day.     ??? methotrexate 25 mg/mL injection solution INJECT 1 ML (25 MG TOTAL) UNDER THE SKIN EVERY SEVEN (7) DAYS. LABS DUE NEXT MONTH 4 mL 3   ??? metoprolol tartrate (LOPRESSOR) 25 MG tablet Take 25 mg by mouth daily.     ??? multivitamin with minerals tablet Take 1 tablet by mouth once a week.     ??? simethicone (MYLICON) 125 MG chewable tablet Take 2 tablets (250 mg) at 5:00 pm the day before your procedure. Take remaining 2 tablets (250 mg) at least 4 hours before your procedure time. 4 tablet 0   ??? vit A/vit C/vit E/zinc/copper (PRESERVISION AREDS ORAL) Take by mouth.       No current facility-administered medications for this visit.        Changes to medications: [name] reports no changes at this time.    Allergies   Allergen Reactions   ??? Infliximab Rash     Drop in blood pressure  Sharp back pains     ??? Codeine Dizziness     Nausea  Blurred vision         Changes to allergies: No    SPECIALTY MEDICATION ADHERENCE     Simponi 100 mg/ml: 0 days of medicine on hand          Specialty medication(s) dose(s) confirmed: Regimen is correct and unchanged.     Are there any concerns with adherence? No    Adherence counseling provided? Not needed    CLINICAL MANAGEMENT AND INTERVENTION      Clinical Benefit Assessment:    Do you feel the medicine is effective or helping your condition? Yes    Clinical Benefit counseling provided? Not needed    Adverse Effects Assessment:    Are you experiencing any side effects? No    Are you experiencing difficulty administering your medicine? No    Quality of Life Assessment:    How many days over the past month did your ulcerative colitis  keep you from your normal activities? For example, brushing your teeth or getting up in the morning. 0    Have you discussed this with your provider? Not needed    Therapy Appropriateness:  Is therapy appropriate? Yes, therapy is appropriate and should be continued    DISEASE/MEDICATION-SPECIFIC INFORMATION      For patients on injectable medications: Patient currently has 0 doses left.  Next injection is scheduled for 11/23.    PATIENT SPECIFIC NEEDS     - Does the patient have any physical, cognitive, or cultural barriers? No    - Is the patient high risk? No    - Does the patient require a Care Management Plan? No     - Does the patient require physician intervention or other additional services (i.e. nutrition, smoking cessation, social work)? No      SHIPPING     Specialty Medication(s) to be Shipped:   Inflammatory Disorders: Simponi    Other medication(s) to be shipped: No additional medications requested for fill at this time     Changes to insurance: No    Delivery Scheduled: Yes, Expected medication delivery date: 11/18.     Medication will be delivered via UPS to the confirmed prescription address in [facility].    The patient will receive a drug information handout for each medication shipped and additional FDA Medication Guides as required.  Verified that patient has previously received a Conservation officer, historic buildings.    All of the patient's questions and concerns have been addressed.    Julianne Rice   [facility] Shared [facility] Specialty Pharmacist

## 2020-03-27 MED FILL — SIMPONI 100 MG/ML SUBCUTANEOUS PEN INJECTOR: 28 days supply | Qty: 1 | Fill #10 | Status: AC

## 2020-03-27 MED FILL — SIMPONI 100 MG/ML SUBCUTANEOUS PEN INJECTOR: SUBCUTANEOUS | 28 days supply | Qty: 1 | Fill #10

## 2020-04-15 ENCOUNTER — Encounter: Admit: 2020-04-15 | Discharge: 2020-04-15 | Payer: MEDICARE | Attending: Anesthesiology | Primary: Anesthesiology

## 2020-04-15 ENCOUNTER — Ambulatory Visit: Admit: 2020-04-15 | Discharge: 2020-04-15 | Payer: MEDICARE

## 2020-04-15 DIAGNOSIS — K50814 Crohn's disease of both small and large intestine with abscess: Principal | ICD-10-CM

## 2020-04-15 DIAGNOSIS — E669 Obesity, unspecified: Principal | ICD-10-CM

## 2020-04-15 DIAGNOSIS — K6289 Other specified diseases of anus and rectum: Principal | ICD-10-CM

## 2020-04-15 DIAGNOSIS — K523 Indeterminate colitis: Principal | ICD-10-CM

## 2020-04-15 DIAGNOSIS — E785 Hyperlipidemia, unspecified: Principal | ICD-10-CM

## 2020-04-15 DIAGNOSIS — K644 Residual hemorrhoidal skin tags: Principal | ICD-10-CM

## 2020-04-15 DIAGNOSIS — I10 Essential (primary) hypertension: Principal | ICD-10-CM

## 2020-04-15 DIAGNOSIS — E119 Type 2 diabetes mellitus without complications: Principal | ICD-10-CM

## 2020-04-15 DIAGNOSIS — Z885 Allergy status to narcotic agent status: Principal | ICD-10-CM

## 2020-04-15 DIAGNOSIS — Z7984 Long term (current) use of oral hypoglycemic drugs: Principal | ICD-10-CM

## 2020-04-15 DIAGNOSIS — Z6830 Body mass index (BMI) 30.0-30.9, adult: Principal | ICD-10-CM

## 2020-04-15 DIAGNOSIS — Z79899 Other long term (current) drug therapy: Principal | ICD-10-CM

## 2020-04-15 DIAGNOSIS — K5080 Crohn's disease of both small and large intestine without complications: Secondary | ICD-10-CM | POA: Diagnosis not present

## 2020-04-15 DIAGNOSIS — K529 Noninfective gastroenteritis and colitis, unspecified: Secondary | ICD-10-CM | POA: Diagnosis not present

## 2020-04-15 MED ADMIN — propofol (DIPRIVAN) infusion 10 mg/mL: INTRAVENOUS | @ 18:00:00 | Stop: 2020-04-15

## 2020-04-15 MED ADMIN — propofoL (DIPRIVAN) injection: INTRAVENOUS | @ 18:00:00 | Stop: 2020-04-15

## 2020-04-15 MED ADMIN — lactated Ringers infusion: 10 mL/h | INTRAVENOUS | @ 18:00:00 | Stop: 2020-04-15

## 2020-04-19 DIAGNOSIS — K51919 Ulcerative colitis, unspecified with unspecified complications: Principal | ICD-10-CM

## 2020-04-19 MED ORDER — SIMPONI 100 MG/ML SUBCUTANEOUS PEN INJECTOR
SUBCUTANEOUS | 11 refills | 28.00000 days | Status: CN
Start: 2020-04-19 — End: 2021-04-19

## 2020-04-19 NOTE — Unmapped (Signed)
[facility] Pharm[facility] Refill Coordination Note    Specialty Medication(s) to be Shipped:   Inflammatory Disorders: Simponi 100mg /ml  Other medication(s) to be shipped: No additional medications requested for fill at this time     Justin Mora, DOB: 1951-05-04  Phone: [phone] (home)     All above HIPAA information was verified with patient.     Was a Nurse, learning disability used for this call? No    Completed refill call assessment today to schedule patient's medication shipment from the [facility] [phone]).       Specialty medication(s) and dose(s) confirmed: Regimen is correct and unchanged.   Changes to medications: Justin Mora reports no changes at this time.  Changes to insurance: No  Questions for the pharmacist: No    Confirmed patient received Welcome Packet with first shipment. The patient will receive a drug information handout for each medication shipped and additional FDA Medication Guides as required.       DISEASE/MEDICATION-SPECIFIC INFORMATION        For patients on injectable medications: Patient currently has 0 doses left.  Next injection is scheduled for 04/30/2020.    SPECIALTY MEDICATION ADHERENCE     Medication Adherence    Patient reported X missed doses in the last month: 0  Specialty Medication: Simponi 100mg /ml  Patient is on additional specialty medications: No  Informant: patient  Reliability of informant: reliable          SHIPPING     Shipping address confirmed in Epic.     Delivery Scheduled: Yes, Expected medication delivery date: 04/25/2020.     Medication will be delivered via UPS to the prescription address in Epic WAM.    Kenleigh Toback P Wetzel [place] Shared [facility] Specialty Technician

## 2020-04-24 NOTE — Unmapped (Signed)
Army Melia 's Simponi shipment will be delayed as a result of no refills remain on the prescription.      I have reached out to the patient and communicated the delay. We will call the patient back to reschedule the delivery upon resolution. We have not confirmed the new delivery date.

## 2020-04-26 DIAGNOSIS — K51919 Ulcerative colitis, unspecified with unspecified complications: Principal | ICD-10-CM

## 2020-04-29 DIAGNOSIS — K51919 Ulcerative colitis, unspecified with unspecified complications: Principal | ICD-10-CM

## 2020-04-29 MED ORDER — SIMPONI 100 MG/ML SUBCUTANEOUS PEN INJECTOR
SUBCUTANEOUS | 11 refills | 28 days | Status: CP
Start: 2020-04-29 — End: 2021-04-29
  Filled 2020-05-01: qty 1, 28d supply, fill #0

## 2020-04-29 NOTE — Unmapped (Signed)
Army Melia 's simponi shipment will be canceled  as a result of no refills remain on the prescription.      I have reached out to the patient and communicated the delay. We will not reschedule the medication and have removed this/these medication(s) from the work request.  We have canceled this work request.

## 2020-04-30 NOTE — Unmapped (Signed)
Army Melia called back about the delivery for simponi and would like the delivery to ship out 05/01/2020 via UPS or [facility] to be delivered 05/02/2020 . We have confirmed the delivery via UPS delivery .

## 2020-05-01 MED FILL — SIMPONI 100 MG/ML SUBCUTANEOUS PEN INJECTOR: 28 days supply | Qty: 1 | Fill #0 | Status: AC

## 2020-05-16 DIAGNOSIS — K51919 Ulcerative colitis, unspecified with unspecified complications: Principal | ICD-10-CM

## 2020-05-16 DIAGNOSIS — E1165 Type 2 diabetes mellitus with hyperglycemia: Secondary | ICD-10-CM | POA: Diagnosis not present

## 2020-05-16 DIAGNOSIS — K5190 Ulcerative colitis, unspecified, without complications: Secondary | ICD-10-CM | POA: Diagnosis not present

## 2020-05-16 DIAGNOSIS — I10 Essential (primary) hypertension: Secondary | ICD-10-CM | POA: Diagnosis not present

## 2020-05-16 DIAGNOSIS — E11319 Type 2 diabetes mellitus with unspecified diabetic retinopathy without macular edema: Secondary | ICD-10-CM | POA: Diagnosis not present

## 2020-05-16 DIAGNOSIS — F321 Major depressive disorder, single episode, moderate: Secondary | ICD-10-CM | POA: Diagnosis not present

## 2020-05-16 DIAGNOSIS — Z0000 Encounter for general adult medical examination without abnormal findings: Secondary | ICD-10-CM | POA: Diagnosis not present

## 2020-05-16 DIAGNOSIS — L4059 Other psoriatic arthropathy: Secondary | ICD-10-CM | POA: Diagnosis not present

## 2020-05-16 DIAGNOSIS — F419 Anxiety disorder, unspecified: Secondary | ICD-10-CM | POA: Diagnosis not present

## 2020-05-16 MED ORDER — FOLIC ACID 1 MG TABLET
ORAL_TABLET | Freq: Every day | ORAL | 11 refills | 30 days | Status: CP
Start: 2020-05-16 — End: 2021-05-16

## 2020-05-17 NOTE — Unmapped (Signed)
[facility] Refill Coordination Note    Specialty Medication(s) to be Shipped:   Inflammatory Disorders: Simponi    Other medication(s) to be shipped: No additional medications requested for fill at this time     Justin Mora, DOB: [birth year]  Phone: [phone] (home)       All above HIPAA information was verified with patient.     Was a Nurse, learning disability used for this call? No    Completed refill call assessment today to schedule patient's medication shipment from the [facility] [phone]).       Specialty medication(s) and dose(s) confirmed: Regimen is correct and unchanged.   Changes to medications: Justin Mora reports no changes at this time.  Changes to insurance: No  Questions for the pharmacist: No    Confirmed patient received Welcome Packet with first shipment. The patient will receive a drug information handout for each medication shipped and additional FDA Medication Guides as required.       DISEASE/MEDICATION-SPECIFIC INFORMATION        For patients on injectable medications: Patient currently has 0 doses left.  Next injection is scheduled for 05/30/20.    SPECIALTY MEDICATION ADHERENCE     Medication Adherence    Patient reported X missed doses in the last month: 0  Specialty Medication: Simponi 100mg /ml  Patient is on additional specialty medications: No  Informant: patient                    SHIPPING     Shipping address confirmed in Epic.     Delivery Scheduled: Yes, Expected medication delivery date: 05/23/20.     Medication will be delivered via UPS to the prescription address in Epic WAM.    Jasper Loser   [facility] Specialty Technician

## 2020-05-22 MED FILL — SIMPONI 100 MG/ML SUBCUTANEOUS PEN INJECTOR: SUBCUTANEOUS | 28 days supply | Qty: 1 | Fill #1

## 2020-05-27 DIAGNOSIS — K51919 Ulcerative colitis, unspecified with unspecified complications: Principal | ICD-10-CM

## 2020-05-27 MED ORDER — METHOTREXATE SODIUM 25 MG/ML INJECTION SOLUTION
SUBCUTANEOUS | 3 refills | 0 days
Start: 2020-05-27 — End: ?

## 2020-05-28 MED ORDER — METHOTREXATE SODIUM 25 MG/ML INJECTION SOLUTION
SUBCUTANEOUS | 3 refills | 28.00000 days | Status: CP
Start: 2020-05-28 — End: 2020-06-20

## 2020-06-07 DIAGNOSIS — Z0100 Encounter for examination of eyes and vision without abnormal findings: Secondary | ICD-10-CM | POA: Diagnosis not present

## 2020-06-10 DIAGNOSIS — K51919 Ulcerative colitis, unspecified with unspecified complications: Principal | ICD-10-CM

## 2020-06-13 NOTE — Unmapped (Signed)
[facility] Pharmacy Refill Coordination Note    Specialty Medication(s) to be Shipped:   Inflammatory Disorders: Simponi 100mg /ml  Other medication(s) to be shipped: No additional medications requested for fill at this time     Justin Mora, DOB: [birth year]  Phone: [phone] (home)     All above HIPAA information was verified with patient.     Was a Nurse, learning disability used for this call? No    Completed refill call assessment today to schedule patient's medication shipment from the [facility] [phone]).       Specialty medication(s) and dose(s) confirmed: Regimen is correct and unchanged.   Changes to medications: Justin Mora reports starting the following medications: Clonazepam 0.5mg  (1 tab at night) & Escitalopram 10mg  (1 tab daily)  Changes to insurance: No  Questions for the pharmacist: No    Confirmed patient received Welcome Packet with first shipment. The patient will receive a drug information handout for each medication shipped and additional FDA Medication Guides as required.       DISEASE/MEDICATION-SPECIFIC INFORMATION        For patients on injectable medications: Patient currently has 0 doses left.  Next injection is scheduled for 06/27/2020.    SPECIALTY MEDICATION ADHERENCE     Medication Adherence    Patient reported X missed doses in the last month: 0  Specialty Medication: Simponi 100mg /ml  Patient is on additional specialty medications: No  Patient is on more than two specialty medications: No  Informant: patient  Reliability of informant: reliable  Reasons for non-adherence: no problems identified          SHIPPING     Shipping address confirmed in Epic.     Delivery Scheduled: Yes, Expected medication delivery date: 06/20/2020.     Medication will be delivered via UPS to the prescription address in Epic WAM.    Carmeron Heady P Wetzel [place] Shared [facility] Specialty Technician

## 2020-06-18 NOTE — Unmapped (Signed)
Left message for pt to call back, we received a refill request from [facility] for Methotrexate but we haven't had any recent labs. Pt doesn't have any upcoming appts scheduled. Awaiting a call back to move forward with refill.

## 2020-06-19 MED FILL — SIMPONI 100 MG/ML SUBCUTANEOUS PEN INJECTOR: SUBCUTANEOUS | 28 days supply | Qty: 1 | Fill #2

## 2020-06-19 NOTE — Unmapped (Signed)
Patient not being seen by Rheumatology. Pt made aware to call his GI MD to get refill.

## 2020-06-20 DIAGNOSIS — K50919 Crohn's disease, unspecified, with unspecified complications: Principal | ICD-10-CM

## 2020-06-20 DIAGNOSIS — K51919 Ulcerative colitis, unspecified with unspecified complications: Principal | ICD-10-CM

## 2020-06-20 MED ORDER — METHOTREXATE SODIUM 25 MG/ML INJECTION SOLUTION
SUBCUTANEOUS | 1 refills | 28 days | Status: CP
Start: 2020-06-20 — End: 2021-06-20

## 2020-06-21 DIAGNOSIS — K50919 Crohn's disease, unspecified, with unspecified complications: Secondary | ICD-10-CM | POA: Diagnosis not present

## 2020-06-22 LAB — COMPREHENSIVE METABOLIC PANEL
A/G RATIO: 1.5 (ref 1.2–2.2)
ALBUMIN: 4.6 g/dL (ref 3.7–4.7)
ALKALINE PHOSPHATASE: 81 IU/L (ref 44–121)
ALT (SGPT): 10 IU/L (ref 0–44)
AST (SGOT): 14 IU/L (ref 0–40)
BILIRUBIN TOTAL: 0.4 mg/dL (ref 0.0–1.2)
BLOOD UREA NITROGEN: 17 mg/dL (ref 8–27)
BUN / CREAT RATIO: 16 (ref 10–24)
CALCIUM: 9.3 mg/dL (ref 8.6–10.2)
CHLORIDE: 98 mmol/L (ref 96–106)
CO2: 18 mmol/L — ABNORMAL LOW (ref 20–29)
CREATININE: 1.04 mg/dL (ref 0.76–1.27)
GFR MDRD AF AMER: 83 mL/min/{1.73_m2}
GFR MDRD NON AF AMER: 72 mL/min/{1.73_m2}
GLOBULIN, TOTAL: 3 g/dL (ref 1.5–4.5)
GLUCOSE: 116 mg/dL — ABNORMAL HIGH (ref 65–99)
POTASSIUM: 5 mmol/L (ref 3.5–5.2)
SODIUM: 135 mmol/L (ref 134–144)
TOTAL PROTEIN: 7.6 g/dL (ref 6.0–8.5)

## 2020-06-22 LAB — CBC
HEMATOCRIT: 43.7 % (ref 37.5–51.0)
HEMOGLOBIN: 14.4 g/dL (ref 13.0–17.7)
MEAN CORPUSCULAR HEMOGLOBIN CONC: 33 g/dL (ref 31.5–35.7)
MEAN CORPUSCULAR HEMOGLOBIN: 29 pg (ref 26.6–33.0)
MEAN CORPUSCULAR VOLUME: 88 fL (ref 79–97)
PLATELET COUNT: 362 10*3/uL (ref 150–450)
RED BLOOD CELL COUNT: 4.97 x10E6/uL (ref 4.14–5.80)
RED CELL DISTRIBUTION WIDTH: 13.5 % (ref 11.6–15.4)
WHITE BLOOD CELL COUNT: 11.1 10*3/uL — ABNORMAL HIGH (ref 3.4–10.8)

## 2020-06-22 LAB — C-REACTIVE PROTEIN: C-REACTIVE PROTEIN: 28 mg/L — ABNORMAL HIGH (ref 0–10)

## 2020-06-24 NOTE — Unmapped (Signed)
Patient notified of [facility] standing order entry

## 2020-06-27 DIAGNOSIS — E782 Mixed hyperlipidemia: Secondary | ICD-10-CM | POA: Diagnosis not present

## 2020-06-27 DIAGNOSIS — F419 Anxiety disorder, unspecified: Secondary | ICD-10-CM | POA: Diagnosis not present

## 2020-06-27 DIAGNOSIS — E1165 Type 2 diabetes mellitus with hyperglycemia: Secondary | ICD-10-CM | POA: Diagnosis not present

## 2020-06-27 DIAGNOSIS — K5090 Crohn's disease, unspecified, without complications: Secondary | ICD-10-CM | POA: Diagnosis not present

## 2020-06-27 DIAGNOSIS — N182 Chronic kidney disease, stage 2 (mild): Secondary | ICD-10-CM | POA: Diagnosis not present

## 2020-07-10 DIAGNOSIS — K51919 Ulcerative colitis, unspecified with unspecified complications: Principal | ICD-10-CM

## 2020-07-10 MED ORDER — METHOTREXATE SODIUM 25 MG/ML INJECTION SOLUTION
SUBCUTANEOUS | 3 refills | 28 days | Status: CP
Start: 2020-07-10 — End: 2021-07-10

## 2020-07-10 MED ORDER — SYRINGE (DISPOSABLE) 3 ML
11 refills | 0 days | Status: CP
Start: 2020-07-10 — End: ?

## 2020-07-10 NOTE — Unmapped (Signed)
Patient requested methotrexate and syringe refill to [facility] order pharmacy.  Labs are up to date as of 06/2020    Refills sent

## 2020-07-12 NOTE — Unmapped (Signed)
[facility] Refill Coordination Note    Specialty Medication(s) to be Shipped:   Inflammatory Disorders: Simponi 100mg /ml  Other medication(s) to be shipped: No additional medications requested for fill at this time     Justin Mora, DOB: 02-01-1951  Phone: [phone] (home)     All above HIPAA information was verified with patient.     Was a Nurse, learning disability used for this call? No    Completed refill call assessment today to schedule patient's medication shipment from the [facility] [phone]).       Specialty medication(s) and dose(s) confirmed: Regimen is correct and unchanged.   Changes to medications: Justin Mora reports no changes at this time.  Changes to insurance: No  Questions for the pharmacist: No    Confirmed patient received Welcome Packet with first shipment. The patient will receive a drug information handout for each medication shipped and additional FDA Medication Guides as required.       DISEASE/MEDICATION-SPECIFIC INFORMATION        For patients on injectable medications: Patient currently has 0 doses left.  Next injection is scheduled for 07/25/2020.    SPECIALTY MEDICATION ADHERENCE     Medication Adherence    Patient reported X missed doses in the last month: 0  Specialty Medication: Simponi 100mg /ml  Patient is on additional specialty medications: No  Patient is on more than two specialty medications: No  Informant: patient  Reliability of informant: reliable  Reasons for non-adherence: no problems identified        SHIPPING     Shipping address confirmed in Epic.     Delivery Scheduled: Yes, Expected medication delivery date: 07/18/2020.     Medication will be delivered via UPS to the prescription address in Epic WAM.    Justin Mora Shared [facility] Specialty Technician

## 2020-07-17 MED FILL — SIMPONI 100 MG/ML SUBCUTANEOUS PEN INJECTOR: SUBCUTANEOUS | 28 days supply | Qty: 1 | Fill #3

## 2020-08-01 ENCOUNTER — Other Ambulatory Visit: Payer: Self-pay

## 2020-08-01 ENCOUNTER — Encounter: Payer: Self-pay | Admitting: Cardiology

## 2020-08-01 ENCOUNTER — Ambulatory Visit: Payer: Medicare Other | Admitting: Cardiology

## 2020-08-01 VITALS — BP 106/66 | HR 54 | Temp 98.3°F | Resp 16 | Ht 67.0 in | Wt 190.2 lb

## 2020-08-01 DIAGNOSIS — I2510 Atherosclerotic heart disease of native coronary artery without angina pectoris: Secondary | ICD-10-CM

## 2020-08-01 DIAGNOSIS — I10 Essential (primary) hypertension: Secondary | ICD-10-CM | POA: Diagnosis not present

## 2020-08-01 DIAGNOSIS — E119 Type 2 diabetes mellitus without complications: Secondary | ICD-10-CM

## 2020-08-01 DIAGNOSIS — R0989 Other specified symptoms and signs involving the circulatory and respiratory systems: Secondary | ICD-10-CM | POA: Diagnosis not present

## 2020-08-01 DIAGNOSIS — I209 Angina pectoris, unspecified: Secondary | ICD-10-CM | POA: Diagnosis not present

## 2020-08-01 DIAGNOSIS — K5190 Ulcerative colitis, unspecified, without complications: Secondary | ICD-10-CM | POA: Diagnosis not present

## 2020-08-01 MED ORDER — ASPIRIN EC 81 MG PO TBEC: 81.0000 mg | DELAYED_RELEASE_TABLET | Freq: Every day | ORAL | 3 refills | Status: DC

## 2020-08-01 NOTE — Progress Notes (Signed)
Primary Physician/Referring:  [name], MD  Patient ID: Benjamin Chapman, adult    DOB: 1951-04-08, 70 y.o.   MRN: [id number]  Chief Complaint  Patient presents with  . Coronary Artery Disease  . Follow-up    1 year  . Chest Pain   HPI:    Benjamin Chapman  is a 70 y.o. psoriatic arthritis, Hypertension, hyperlipidemia, type 2 diabetes, hypertension, ulcerative colitis, and psoriasis who presents for a one yesr  OV for evaluation of angina pectoris, he has had abnormal stress test 07/08/2018 imaging day moderate size inferolateral wall ischemia considered high risk on a submaximal stress test.  Echocardiogram at that time revealed trace aortic stenosis and mild diastolic dysfunction.  Since being on aggressive medical therapy, he has not had any further episodes of chest pain and feels well.  He did have Covid 19 in Feb 2021.  Recently was treated for depression, since then he had developed significant bowel abnormality and thought that he may have had exacerbation in his ulcerative colitis.  He was having bloating and diarrhea.  Symptoms seem to have been gradually improving.    Past Medical History:  Diagnosis Date  . Bilateral carotid bruits   . Diabetes mellitus without complication ([place])   . Dyspnea   . Hypertension   . Psoriatic arthritis ([place])   . Systolic murmur of aorta   . Ulcerative colitis, acute [facility])    Past Surgical History:  Procedure Laterality Date  . COLONOSCOPY WITH PROPOFOL N/A 01/09/2019   Procedure: COLONOSCOPY WITH PROPOFOL;  Surgeon: Lollie Sails, MD;  Location: [facility] ENDOSCOPY;  Service: Endoscopy;  Laterality: N/A;  . KNEE ARTHROSCOPY Right   . MELANOMA EXCISION N/A 1992   back  . SHOULDER ARTHROSCOPY     bi-cept   Family History  Problem Relation Age of Onset  . Alzheimer's disease Mother   . Stroke Mother   . Diabetes Mother     Social History   Tobacco Use  . Smoking status: Former Smoker    Packs/day: 1.00    Years: 20.00     Pack years: 20.00    Types: Cigarettes    Quit date: 07/15/1998    Years since quitting: 22.0  . Smokeless tobacco: Never Used  Substance Use Topics  . Alcohol use: Yes    Comment: occasional   ROS  Review of Systems  Constitutional: Positive for weight loss (intentional). Negative for weight gain.  Cardiovascular: Negative for chest pain, dyspnea on exertion and leg swelling.  Musculoskeletal: Positive for arthritis.  Gastrointestinal: Positive for bloating and change in bowel habit. Negative for melena.   Objective  Blood pressure 106/66, pulse (!) 54, temperature 98.3 F (36.8 C), temperature source Temporal, resp. rate 16, height 5' 7" (1.702 m), weight 190 lb 3.2 oz (86.3 kg), SpO2 98 %.  Vitals with BMI 08/01/2020 07/28/2019 01/27/2019  Height 5' 7" 5' 7" 5' 7"  Weight 190 lbs 3 oz 214 lbs 13 oz 204 lbs  BMI 29.78 81.85 63.14  Systolic 970 263 785  Diastolic 66 79 68  Pulse 54 59 57     Physical Exam Constitutional:      General: She is not in acute distress.    Appearance: She is well-developed.  Cardiovascular:     Rate and Rhythm: Normal rate and regular rhythm.     Pulses: Intact distal pulses.          Carotid pulses are on the right side with bruit and on the  left side with bruit.    Heart sounds: S1 normal and S2 normal. Murmur heard.   Early systolic murmur is present with a grade of 2/6 at the upper right sternal border. No gallop.      Comments: No JVD No leg edema Pulmonary:     Effort: Pulmonary effort is normal.     Breath sounds: Normal breath sounds.  Abdominal:     General: Bowel sounds are normal.     Palpations: Abdomen is soft.     Hernia: A hernia (ventral and small umbilical hernia) is present.    Laboratory examination:   No results for input(s): NA, K, CL, CO2, GLUCOSE, BUN, CREATININE, CALCIUM, GFRNONAA, GFRAA in the last 8760 hours. CrCl cannot be calculated (Patient's most recent lab result is older than the maximum 21 days allowed.).   CMP Latest Ref Rng & Units 01/11/2019 11/19/2015 11/19/2015  Glucose 65 - 99 mg/dL 260(H) 202(H) -  BUN 8 - 27 mg/dL 7(L) 10.3 -  Creatinine 0.76 - 1.27 mg/dL 0.82 0.8 -  Sodium 134 - 144 mmol/L 134 137 -  Potassium 3.5 - 5.2 mmol/L 4.2 3.8 -  Chloride 96 - 106 mmol/L 99 - -  CO2 20 - 29 mmol/L 23 23 -  Calcium 8.6 - 10.2 mg/dL 9.5 9.4 -  Total Protein 6.0 - 8.5 g/dL 7.2 8.0 7.4  Total Bilirubin 0.0 - 1.2 mg/dL <0.2 0.48 -  Alkaline Phos 39 - 117 IU/L 74 67 -  AST 0 - 40 IU/L 18 29 -  ALT 0 - 44 IU/L 17 39 -   CBC Latest Ref Rng & Units 11/19/2015  WBC 4.0 - 10.3 10e3/uL 9.2  Hemoglobin 13.0 - 17.1 g/dL 15.8  Hematocrit 38.4 - 49.9 % 44.7  Platelets 140 - 400 10e3/uL 189   Lipid Panel     Component Value Date/Time   CHOL 113 01/11/2019 0929   TRIG 183 (H) 01/11/2019 0929   HDL 44 01/11/2019 0929   LDLCALC 39 01/11/2019 0929   External labs:    Labs 06/21/2020:  Hb 14.4/HCT 43.7, platelets 362, normal indicis.  Serum glucose 160 mg, BUN 17, creatinine 1.04, EGFR 72 mL, potassium 5.0, CMP otherwise normal.  CRP markedly elevated at 28 compared to year ago which was 19.1.  A1C 7.600 % 10/04/2018 TSH 3.180 10/04/2018  Medications and allergies   Allergies  Allergen Reactions  . Codeine     Nausea   . Remicade [Infliximab]     Current Outpatient Medications on File Prior to Visit  Medication Sig Dispense Refill  . amLODipine (NORVASC) 10 MG tablet TAKE 1 TABLET BY MOUTH  DAILY 90 tablet 3  . Golimumab (SIMPONI [place]) Inject into the skin.    [name] Kitchen lisinopril (ZESTRIL) 40 MG tablet Take 1 tablet (40 mg total) by mouth daily. 90 tablet 3  . metFORMIN (GLUCOPHAGE) 500 MG tablet Take 1,000 mg by mouth AC breakfast.    . methotrexate 250 MG/10ML injection Inject 1 mL into the muscle once a week.    . metoprolol tartrate (LOPRESSOR) 25 MG tablet Take 0.5 tablets (12.5 mg total) by mouth 2 (two) times daily. 180 tablet 3  . nitroGLYCERIN (NITROSTAT) 0.4 MG SL tablet DISSOLVE 1  TABLET UNDER THE TONGUE EVERY 5 MINUTES AS  NEEDED FOR CHEST PAIN. MAX  OF 3 TABLETS IN 15 MINUTES. CALL 911 IF PAIN PERSISTS. 100 tablet 3  . atorvastatin (LIPITOR) 10 MG tablet Take 1 tablet (10 mg total) by mouth daily.  90 tablet 3   No current facility-administered medications on file prior to visit.    Radiology:   No results found.  Cardiac Studies:   Exercise myoview stress 07/08/2018: 1. The patient performed treadmill exercise using Bruce protocol, completing 8:30 minutes. The patient completed an estimated workload of 8.9 METS, reaching 82% of the maximum predicted heart rate. Exercise capacity was normal. Abnormal hemodynamic response was seen- Resting hypertension 162/86 mmHg, without expected increase in blood pressure with peak exercise-166/80 mmHg. Stress symptoms included fatigue. No ischemic changes seen on stress electrocardiogram. 2. The overall quality of the study is good.  Left ventricular cavity is noted to be normal on the rest and stress studies.  Gated SPECT imaging demonstrates hypokinesis of the basal inferior, basal inferolateral, mid inferior and mid inferolateral myocardial wall(s).  The left ventricular ejection fraction was calculated or visually estimated to be 60%.  Moderate sized, medium intensity, predominantly reversible perfusion defect in mid to basal inferior/inferolateral myocardium, suggestive of small infarct with moderate peri infarct ischemia in LCx/PDA territory.  3. High risk study with submaximal stress.  Echocardiogram  07/07/2018: 1. Left ventricle cavity is normal in size. Normal global wall motion. Doppler evidence of grade I (impaired) diastolic dysfunction, elevated LVEDP/LA pressure. Calculated EF 55%. 2. Mild aortic valve leaflet calcification. Mildly restricted aortic valve leaflets. Trace aortic valve stenosis. Peak gradient 22, mean gradient 10 mmHg, AVA 1.77 cm. No aortic valve regurgitation noted. 3. Trace tricuspid regurgitation.  Unable to estimate PA pressure due to absence/minimal TR signal.   Carotid artery duplex  07/07/2018: No hemodynamically significant arterial disease in the internal carotid artery bilaterally. Minimal calcific plaque noted biulateral carotids. Antegrade right vertebral artery flow. Antegrade left vertebral artery flow.  Abdominal Aortic Duplex  07/07/2018 : No AAA observed. Normal iliac artery velocity. Large fluid filled area below naval measuring 7.3 cm.  Appears to be a simple cyst, not well studied. If clinically indicated, consider general US abdomen. See image.  EKG   EKG 08/01/2020: Marked sinus bradycardia at rate of 52 bpm, normal axis, early repolarization.  Normal QT interval.  No evidence of ischemia.  No significant change from EKG 07/28/2019   Assessment     ICD-10-CM   1. Coronary artery calcification seen on CT scan  I25.10 EKG 12-Lead    aspirin EC 81 MG tablet  2. Angina pectoris (HCC)  I20.9 aspirin EC 81 MG tablet  3. Essential hypertension  I10   4. Controlled type 2 diabetes mellitus without complication, without long-term current use of insulin (HCC)  E11.9   5. Bilateral carotid bruits  R09.89 PCV CAROTID DUPLEX (BILATERAL)    Meds ordered this encounter  Medications  . aspirin EC 81 MG tablet    Sig: Take 1 tablet (81 mg total) by mouth daily.    Dispense:  90 tablet    Refill:  3    Medications Discontinued During This Encounter  Medication Reason  . aspirin 81 MG tablet Error  . Multiple Vitamins-Minerals (CENTRUM SILVER 50+MEN) TABS Error    Recommendations:   Benjamin Chapman  is a 70 y.o. psoriatic arthritis, Hypertension, hyperlipidemia, type 2 diabetes, hypertension, ulcerative colitis, and psoriasis who presents for a one year OV for evaluation of angina pectoris, he has had abnormal stress test 07/08/2018 imaging day moderate size inferolateral wall ischemia considered high risk on a submaximal stress test.  Echocardiogram at that time revealed  trace aortic stenosis and mild diastolic dysfunction.  Since being on aggressive medical therapy,  he has not had any further episodes of chest pain and feels well.  His blood pressure is well controlled, I reviewed his external labs, renal function is normal and CBC is normal.  He was told by his PCP that his lipids are under excellent control as well.  He has lost close to 40 pounds in weight since he started seeing [place] about 2 years ago.  Overall I am very pleased with his progress.  His left carotid bruit is slightly more prominent than previously well, I will repeat carotid artery duplex.  Otherwise stable from cardiac standpoint I will see him back in a year and if he remains stable, I will see him back on a as needed basis.  With regard to abdominal bloating and diarrhea, he is also on Metformin, do not know whether this is contributing to his diarrhea.  He could certainly hold this for a week or 10 days to see whether there is improvement in symptoms and to contact his PCP regarding further management.   Adrian Prows, MD, [facility] 08/01/2020, 9:19 AM Office: [phone] Pager: [phone]

## 2020-08-05 ENCOUNTER — Ambulatory Visit: Payer: Medicare HMO | Admitting: Podiatry

## 2020-08-05 ENCOUNTER — Encounter: Payer: Self-pay | Admitting: Podiatry

## 2020-08-05 ENCOUNTER — Other Ambulatory Visit: Payer: Self-pay

## 2020-08-05 VITALS — BP 134/63 | HR 79

## 2020-08-05 DIAGNOSIS — E139 Other specified diabetes mellitus without complications: Secondary | ICD-10-CM

## 2020-08-05 DIAGNOSIS — E119 Type 2 diabetes mellitus without complications: Secondary | ICD-10-CM | POA: Diagnosis not present

## 2020-08-05 DIAGNOSIS — L409 Psoriasis, unspecified: Secondary | ICD-10-CM | POA: Diagnosis not present

## 2020-08-05 NOTE — Progress Notes (Signed)
This patient presents to the office with occasional severe burning on both feet.  He says the burning is severe he steps on a bag of ice when this occurs.  He also has history of psoriasis and diabetes .    He says this burning is occasional and he presents to the office to evaluate this condition. No symptoms today.  Vascular  Dorsalis pedis and posterior tibial pulses are palpable  B/L.  Capillary return  WNL.  Temperature gradient is  WNL.  Skin turgor  WNL  Sensorium  Senn Weinstein monofilament wire  WNL. Normal tactile sensation.  Nail Exam  Patient has normal nails with no evidence of bacterial or fungal infection.  Orthopedic  Exam  Muscle tone and muscle strength  WNL.  No limitations of motion feet  B/L.  No crepitus or joint effusion noted.  Foot type is unremarkable and  show no abnormalities.  Bony prominences are unremarkable. Hammer toes  B/L.  Skin  No open lesions.  Normal skin texture and turgor. Psoriasis in arch both feet.  Diabetes with no foot complications  Burning of unknown etiology    IE  Discussed these symptoms with this patient and Dr.  Milinda Pointer.  Told him these symptoms may be due to diabetes or hypergammaglobullinia or psoriasis.  Told patient this appears to be a benign condition.  To check with his medical doctor.  RTC prn.  Gardiner Barefoot DPM

## 2020-08-07 NOTE — Unmapped (Signed)
[facility] Shared [facility] Clinical Assessment & Refill Coordination Note    Justin Mora, DOB: 1951-02-24  Phone: [phone] (home)     All above HIPAA information was verified with patient.     Was a Nurse, learning disability used for this call? No    Specialty Medication(s):   Inflammatory Disorders: Simponi     Current Outpatient Medications   Medication Sig Dispense Refill   ??? amLODIPine (NORVASC) 5 MG tablet Take 5 mg by mouth daily.     ??? atorvastatin (LIPITOR) 10 MG tablet Take 10 mg by mouth daily.     ??? cholecalciferol, vitamin D3, 1,000 unit (25 mcg) tablet Take 1,000 Units by mouth once a week.     ??? empty container Misc USE AS DIRECTED 1 each 2   ??? folic acid (FOLVITE) 1 MG tablet Take 1 tablet (1 mg total) by mouth daily. 30 tablet 11   ??? golimumab (SIMPONI) 100 mg/mL PnIj Inject the contents of 1 pen (100 mg total) under the skin every twenty-eight (28) days. 1 mL 11   ??? lisinopriL (PRINIVIL,ZESTRIL) 40 MG tablet Take 40 mg by mouth daily.     ??? metFORMIN (GLUCOPHAGE) 500 MG tablet Take 1,000 mg by mouth 2 (two) times a day.     ??? methotrexate 25 mg/mL injection solution Inject 1 mL (25 mg total) under the skin every seven (7) days. 4 mL 3   ??? metoprolol tartrate (LOPRESSOR) 25 MG tablet Take 25 mg by mouth daily.     ??? multivitamin with minerals tablet Take 1 tablet by mouth once a week.     ??? simethicone (MYLICON) 125 MG chewable tablet Take 2 tablets (250 mg) at 5:00 pm the day before your procedure. Take remaining 2 tablets (250 mg) at least 4 hours before your procedure time. 4 tablet 0   ??? syringe, disposable, 3 mL Syrg 1 each by Miscellaneous route every seven (7) days. For subcutaneous methotrexate injections 25 each 11   ??? vit A/vit C/vit E/zinc/copper (PRESERVISION AREDS ORAL) Take by mouth.       No current facility-administered medications for this visit.        Changes to medications: Justin Mora reports no changes at this time.    Allergies   Allergen Reactions   ??? Infliximab Rash Drop in blood pressure  Sharp back pains     ??? Codeine Dizziness     Nausea  Blurred vision         Changes to allergies: No    SPECIALTY MEDICATION ADHERENCE     Simponi 100mg /ml: 0 days of medicine on hand     Medication Adherence    Patient reported X missed doses in the last month: 0  Specialty Medication: Simponi 100mg /ml  Patient is on additional specialty medications: No  Informant: patient          Specialty medication(s) dose(s) confirmed: Regimen is correct and unchanged.     Are there any concerns with adherence? No    Adherence counseling provided? Not needed    CLINICAL MANAGEMENT AND INTERVENTION      Clinical Benefit Assessment:    Do you feel the medicine is effective or helping your condition? Yes    Clinical Benefit counseling provided? Not needed    Adverse Effects Assessment:    Are you experiencing any side effects? No    Are you experiencing difficulty administering your medicine? No    Quality of Life Assessment:    How many days  over the past month did your UC  keep you from your normal activities? For example, brushing your teeth or getting up in the morning. 0    Have you discussed this with your provider? Not needed    Acute Infection Status:    Acute infections noted within Epic:  No active infections  Patient reported infection: None    Therapy Appropriateness:    Is therapy appropriate? Yes, therapy is appropriate and should be continued    DISEASE/MEDICATION-SPECIFIC INFORMATION      For patients on injectable medications: Patient currently has 0 doses left.  Next injection is scheduled for 08/22/2020.    PATIENT SPECIFIC NEEDS     - Does the patient have any physical, cognitive, or cultural barriers? No    - Is the patient high risk? No    - Does the patient require a Care Management Plan? No     - Does the patient require physician intervention or other additional services (i.e. nutrition, smoking cessation, social work)? No      SHIPPING     Specialty Medication(s) to be Shipped: Inflammatory Disorders: Simpoini 100mg /ml    Other medication(s) to be shipped: No additional medications requested for fill at this time     Changes to insurance: No    Delivery Scheduled: Yes, Expected medication delivery date: 08/20/2020.     Medication will be delivered via UPS to the confirmed prescription address in [facility].    The patient will receive a drug information handout for each medication shipped and additional FDA Medication Guides as required.  Verified that patient has previously received a Conservation officer, historic buildings and a Surveyor, mining.    All of the patient's questions and concerns have been addressed.    Justin Mora   [facility] Shared [facility] Specialty Pharmacist

## 2020-08-08 DIAGNOSIS — I129 Hypertensive chronic kidney disease with stage 1 through stage 4 chronic kidney disease, or unspecified chronic kidney disease: Secondary | ICD-10-CM | POA: Diagnosis not present

## 2020-08-08 DIAGNOSIS — E782 Mixed hyperlipidemia: Secondary | ICD-10-CM | POA: Diagnosis not present

## 2020-08-08 DIAGNOSIS — N182 Chronic kidney disease, stage 2 (mild): Secondary | ICD-10-CM | POA: Diagnosis not present

## 2020-08-08 DIAGNOSIS — E1122 Type 2 diabetes mellitus with diabetic chronic kidney disease: Secondary | ICD-10-CM | POA: Diagnosis not present

## 2020-08-09 ENCOUNTER — Ambulatory Visit: Payer: Medicare HMO

## 2020-08-09 ENCOUNTER — Other Ambulatory Visit: Payer: Self-pay

## 2020-08-16 DIAGNOSIS — Z0100 Encounter for examination of eyes and vision without abnormal findings: Secondary | ICD-10-CM | POA: Diagnosis not present

## 2020-08-19 MED FILL — SIMPONI 100 MG/ML SUBCUTANEOUS PEN INJECTOR: SUBCUTANEOUS | 28 days supply | Qty: 1 | Fill #4

## 2020-08-29 MED ORDER — ROSUVASTATIN 20 MG TABLET
Freq: Every day | 0 days
Start: 2020-08-29 — End: ?

## 2020-09-02 DIAGNOSIS — K51919 Ulcerative colitis, unspecified with unspecified complications: Principal | ICD-10-CM

## 2020-09-07 DIAGNOSIS — E782 Mixed hyperlipidemia: Secondary | ICD-10-CM | POA: Diagnosis not present

## 2020-09-07 DIAGNOSIS — E1122 Type 2 diabetes mellitus with diabetic chronic kidney disease: Secondary | ICD-10-CM | POA: Diagnosis not present

## 2020-09-07 DIAGNOSIS — I129 Hypertensive chronic kidney disease with stage 1 through stage 4 chronic kidney disease, or unspecified chronic kidney disease: Secondary | ICD-10-CM | POA: Diagnosis not present

## 2020-09-07 DIAGNOSIS — N182 Chronic kidney disease, stage 2 (mild): Secondary | ICD-10-CM | POA: Diagnosis not present

## 2020-09-09 DIAGNOSIS — K50919 Crohn's disease, unspecified, with unspecified complications: Principal | ICD-10-CM

## 2020-09-12 NOTE — Unmapped (Signed)
[facility] Refill Coordination Note    Specialty Medication(s) to be Shipped:   Inflammatory Disorders: Simponi 100mg /ml  Other medication(s) to be shipped: No additional medications requested for fill at this time     Justin Mora, DOB: 08-22-1950  Phone: [phone] (home)     All above HIPAA information was verified with patient.     Was a Nurse, learning disability used for this call? No    Completed refill call assessment today to schedule patient's medication shipment from the [facility] [phone]).  All relevant notes have been reviewed.     Specialty medication(s) and dose(s) confirmed: Regimen is correct and unchanged.   Changes to medications: Justin Mora reports no changes at this time.  Changes to insurance: No  New side effects reported not previously addressed with a pharmacist or physician: None reported  Questions for the pharmacist: No    Confirmed patient received a Conservation officer, historic buildings and a Surveyor, mining with first shipment. The patient will receive a drug information handout for each medication shipped and additional FDA Medication Guides as required.       DISEASE/MEDICATION-SPECIFIC INFORMATION        For patients on injectable medications: Patient currently has 0 doses left.  Next injection is scheduled for 09/22/2020.    SPECIALTY MEDICATION ADHERENCE     Medication Adherence    Patient reported X missed doses in the last month: 0  Specialty Medication: Simponi 100mg /ml  Patient is on additional specialty medications: No  Patient is on more than two specialty medications: No  Informant: patient  Reliability of informant: reliable  Reasons for non-adherence: no problems identified        Were doses missed due to medication being on hold? No    REFERRAL TO PHARMACIST     Referral to the pharmacist: Not needed    [facility]     Shipping address confirmed in Epic.     Delivery Scheduled: Yes, Expected medication delivery date: 09/18/2020.     Medication will be delivered via UPS to the prescription address in Epic [facility].    Justin Mora Shared [facility] Specialty Technician

## 2020-09-17 MED FILL — SIMPONI 100 MG/ML SUBCUTANEOUS PEN INJECTOR: SUBCUTANEOUS | 28 days supply | Qty: 1 | Fill #5

## 2020-10-04 NOTE — Unmapped (Signed)
[facility] Refill Coordination Note    Scheduling medication to be filled for now while we reach out to office to assess for efficacy.     Specialty Medication(s) to be Shipped:   Inflammatory Disorders: Simponi    Other medication(s) to be shipped: No additional medications requested for fill at this time     Justin Mora, DOB: 13-Oct-1950  Phone: [phone] (home)       All above HIPAA information was verified with patient.     Was a Nurse, learning disability used for this call? No    Completed refill call assessment today to schedule patient's medication shipment from the [facility] [phone]).  All relevant notes have been reviewed.     Specialty medication(s) and dose(s) confirmed: Regimen is correct and unchanged.   Changes to medications: Justin Mora reports no changes at this time.  Changes to insurance: No  New side effects reported not previously addressed with a pharmacist or physician: Yes - Patient reports still having urgency. Patient would like to speak to the pharmacist today. Their provider is not aware.  Questions for the pharmacist: Yes: see note from rPh    Confirmed patient received a Conservation officer, historic buildings and a Surveyor, mining with first shipment. The patient will receive a drug information handout for each medication shipped and additional FDA Medication Guides as required.       DISEASE/MEDICATION-SPECIFIC INFORMATION        For patients on injectable medications: Patient currently has 0 doses left.  Next injection is scheduled for 6/12.    SPECIALTY MEDICATION ADHERENCE     Medication Adherence    Patient reported X missed doses in the last month: 0  Specialty Medication: Simponi 100mg /mL  Patient is on additional specialty medications: No  Patient is on more than two specialty medications: No  Any gaps in refill history greater than 2 weeks in the last 3 months: no  Demonstrates understanding of importance of adherence: yes  Informant: patient  Reliability of informant: reliable  Confirmed plan for next specialty medication refill: delivery by pharmacy  Refills needed for supportive medications: not needed              Were doses missed due to medication being on hold? No      REFERRAL TO PHARMACIST     Referral to the pharmacist: Yes - patient request      SHIPPING     Shipping address confirmed in Epic.     Delivery Scheduled: Yes, Expected medication delivery date: 6/8.     Medication will be delivered via UPS to the prescription address in Epic [facility].    Justin Mora  [facility] Shared [facility] Specialty Pharmacist

## 2020-10-04 NOTE — Unmapped (Signed)
[facility] Shared [facility] Clinical Intervention    Type of intervention: Side effect management    Medication involved: Simponi     Problem identified: Patient is having a problem with urgency and not being able to make it to the bathroom.  He is very gassy and mucous.  This occurs daily.  Simponi has been working for him until the last 2 + months.  Patient said he was unable to reach clinic as the phone tree confused him and he wasn't able to leave a message.     Intervention performed: Sending message to Inetta Fermo to follow-up with patient     Follow-up needed: 2 weeks     Approximate time spent: 5-10 minutes    Clinical evidence used to support intervention: Professional judgement    Julianne Rice   [facility] Shared [facility] Specialty Pharmacist

## 2020-10-08 ENCOUNTER — Other Ambulatory Visit: Payer: Medicare HMO

## 2020-10-08 DIAGNOSIS — E782 Mixed hyperlipidemia: Secondary | ICD-10-CM | POA: Diagnosis not present

## 2020-10-08 DIAGNOSIS — E1122 Type 2 diabetes mellitus with diabetic chronic kidney disease: Secondary | ICD-10-CM | POA: Diagnosis not present

## 2020-10-08 DIAGNOSIS — N182 Chronic kidney disease, stage 2 (mild): Secondary | ICD-10-CM | POA: Diagnosis not present

## 2020-10-08 DIAGNOSIS — I129 Hypertensive chronic kidney disease with stage 1 through stage 4 chronic kidney disease, or unspecified chronic kidney disease: Secondary | ICD-10-CM | POA: Diagnosis not present

## 2020-10-09 NOTE — Unmapped (Signed)
Message received from [facility] Clinical Intervention  ??  Type of intervention: Side effect management  ??  Medication involved: Simponi   ??  Problem identified: Patient is having a problem with urgency and not being able to make it to the bathroom.  He is very gassy and mucous.  This occurs daily.  Simponi has been working for him until the last 2 + months.  Patient said he was unable to reach clinic as the phone tree confused him and he wasn't able to leave a message.   ??  Intervention performed: Sending message to Inetta Fermo to follow-up with patient   ??  Follow-up needed: 2 weeks   ??  Approximate time spent: 5-10 minutes  ??  Clinical evidence used to support intervention: Professional judgement  ??  Justin Mora   [facility] Shared [facility] Specialty Pharmacist    Patient contact attempted, voicemail left regarding the above note and to offer clinic visit with Dr. [name] Mcalpine ASAP. Will also send my [place] chart message for visit offering tomorrow 6/2 or next available 6/30     Patient provided with my return contact information. Call back requested for any questions or concerns

## 2020-10-10 ENCOUNTER — Ambulatory Visit: Admit: 2020-10-10 | Discharge: 2020-10-11 | Payer: MEDICARE | Attending: Gastroenterology | Primary: Gastroenterology

## 2020-10-10 DIAGNOSIS — K51919 Ulcerative colitis, unspecified with unspecified complications: Principal | ICD-10-CM

## 2020-10-10 DIAGNOSIS — K529 Noninfective gastroenteritis and colitis, unspecified: Principal | ICD-10-CM

## 2020-10-10 DIAGNOSIS — K50919 Crohn's disease, unspecified, with unspecified complications: Principal | ICD-10-CM

## 2020-10-10 LAB — CBC W/ AUTO DIFF
BASOPHILS ABSOLUTE COUNT: 0.1 10*9/L (ref 0.0–0.1)
BASOPHILS RELATIVE PERCENT: 0.8 %
EOSINOPHILS ABSOLUTE COUNT: 0.3 10*9/L (ref 0.0–0.5)
EOSINOPHILS RELATIVE PERCENT: 2.8 %
HEMATOCRIT: 37.8 % — ABNORMAL LOW (ref 39.0–48.0)
HEMOGLOBIN: 13 g/dL (ref 12.9–16.5)
LYMPHOCYTES ABSOLUTE COUNT: 2.1 10*9/L (ref 1.1–3.6)
LYMPHOCYTES RELATIVE PERCENT: 20.1 %
MEAN CORPUSCULAR HEMOGLOBIN CONC: 34.5 g/dL (ref 32.0–36.0)
MEAN CORPUSCULAR HEMOGLOBIN: 29.2 pg (ref 25.9–32.4)
MEAN CORPUSCULAR VOLUME: 84.8 fL (ref 77.6–95.7)
MEAN PLATELET VOLUME: 7.1 fL (ref 6.8–10.7)
MONOCYTES ABSOLUTE COUNT: 0.9 10*9/L — ABNORMAL HIGH (ref 0.3–0.8)
MONOCYTES RELATIVE PERCENT: 9.1 %
NEUTROPHILS ABSOLUTE COUNT: 6.9 10*9/L (ref 1.8–7.8)
NEUTROPHILS RELATIVE PERCENT: 67.2 %
PLATELET COUNT: 342 10*9/L (ref 150–450)
RED BLOOD CELL COUNT: 4.46 10*12/L (ref 4.26–5.60)
RED CELL DISTRIBUTION WIDTH: 15 % (ref 12.2–15.2)
WBC ADJUSTED: 10.3 10*9/L (ref 3.6–11.2)

## 2020-10-10 LAB — COMPREHENSIVE METABOLIC PANEL
ALBUMIN: 3.8 g/dL (ref 3.4–5.0)
ALKALINE PHOSPHATASE: 62 U/L (ref 46–116)
ALT (SGPT): 19 U/L (ref 10–49)
ANION GAP: 8 mmol/L (ref 5–14)
AST (SGOT): 19 U/L (ref <=34)
BILIRUBIN TOTAL: 0.4 mg/dL (ref 0.3–1.2)
BLOOD UREA NITROGEN: 9 mg/dL (ref 9–23)
BUN / CREAT RATIO: 12
CALCIUM: 9.7 mg/dL (ref 8.7–10.4)
CHLORIDE: 103 mmol/L (ref 98–107)
CO2: 23.7 mmol/L (ref 20.0–31.0)
CREATININE: 0.74 mg/dL
EGFR CKD-EPI (2021) MALE: >90 mL/min/{1.73_m2} (ref >=60)
GLUCOSE RANDOM: 115 mg/dL (ref 70–179)
POTASSIUM: 4.1 mmol/L (ref 3.4–4.8)
PROTEIN TOTAL: 7.6 g/dL (ref 5.7–8.2)
SODIUM: 135 mmol/L (ref 135–145)

## 2020-10-10 LAB — C-REACTIVE PROTEIN: C-REACTIVE PROTEIN: 7 mg/L (ref <=10.0)

## 2020-10-10 LAB — ALT: ALT (SGPT): 19 U/L (ref 10–49)

## 2020-10-10 LAB — AST: AST (SGOT): 19 U/L (ref <=34)

## 2020-10-10 MED ORDER — SIMPONI 100 MG/ML SUBCUTANEOUS PEN INJECTOR
SUBCUTANEOUS | 11 refills | 28 days | Status: CP
Start: 2020-10-10 — End: ?
  Filled 2020-10-16: qty 2, 28d supply, fill #0

## 2020-10-10 MED ORDER — HYDROCORTISONE ACETATE 25 MG RECTAL SUPPOSITORY
Freq: Every evening | RECTAL | 0 refills | 24.00000 days | Status: CP
Start: 2020-10-10 — End: 2020-10-31

## 2020-10-10 MED ORDER — BUDESONIDE 2 MG/ACTUATION RECTAL FOAM
Freq: Every evening | RECTAL | 0 refills | 0 days | Status: CP
Start: 2020-10-10 — End: ?

## 2020-10-10 MED ORDER — HYDROCORTISONE 100 MG/60 ML ENEMA
Freq: Every evening | RECTAL | 0 refills | 1.00000 days | Status: CP
Start: 2020-10-10 — End: ?

## 2020-10-10 NOTE — Unmapped (Signed)
Flexible sigmoidoscopy  Labs today  Simponi every 2 weeks  Anusol suppository twice a day  Hydrocortisone enema every night X 3 weeks  If the Uceris foam is approved can stop the enema and switch to the foam nightly

## 2020-10-10 NOTE — Unmapped (Signed)
Aeneas Bohlken??is a 70 y.o.??male??who presents for as new patient consult for indeterminate colitis. Initial colonoscopy concerning for UC, no with pancolitis and possibly backwash ileitis vs CD. He was in complete clinical remission on Simponi every month and methotrexate.     A month or so ago he started a new anti-depressant. He does not remember the name. He developed severe urgency, mucous and incontinence. He is miserable and this has persisted despite stopping the anti-depressant.    ??  ROS   ??  All 10 organ systems reviewed the balance of which was negative.   ??  ??  GEN: no apparent distress  HEENT: PEERL, OP clear with no erythema,   NEURO: , no obvious neurologic abnormality  ABD: nondistended  Extremities: ??normal gait  Psych: affect appropriate, A&O x3  SKIN: no visible lesions on face  ??  ??  Latravis Pettibone??is a 70 y.o.??male??with a colonoscopy concerning for UC, no with pancolitis and possibly backwash ileitis vs CD. 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       1. Flexible sigmoidoscopy  2. Labs today  3. Simponi every 2 weeks  4. Anusol suppository twice a day  5. Hydrocortisone enema every night X 3 weeks  6. If the Uceris foam is approved can stop the enema and switch to the foam nightly

## 2020-10-10 NOTE — Unmapped (Signed)
Clinical Assessment Needed For: Dose Change  Medication: Simponi 100mg /ml pnij  Last Fill Date/Day Supply: 09/17/20 / 28 day  Copay $0  Was previous dose already scheduled to fill: No    Notes to Pharmacist: Pt has MFR assistance

## 2020-10-15 NOTE — Unmapped (Signed)
[facility] Shared [facility] Clinical Assessment & Refill Coordination Note    Justin Mora, DOB: 12-02-1950  Phone: [phone] (home)     All above HIPAA information was verified with patient.     Was a Nurse, learning disability used for this call? No    Specialty Medication(s):   Inflammatory Disorders: Simponi     Current Outpatient Medications   Medication Sig Dispense Refill   ??? amLODIPine (NORVASC) 10 MG tablet daily.     ??? atorvastatin (LIPITOR) 10 MG tablet Take 10 mg by mouth daily.     ??? budesonide 2 mg/actuation Foam Insert 1 application into the rectum at bedtime. 66.8 g 0   ??? empty container Misc USE AS DIRECTED 1 each 2   ??? folic acid (FOLVITE) 1 MG tablet Take 1 tablet (1 mg total) by mouth daily. 30 tablet 11   ??? golimumab (SIMPONI) 100 mg/mL PnIj Inject the contents of 1 pen (100 mg total) under the skin every fourteen (14) days. 2 mL 11   ??? hydrocortisone (ANUSOL-HC) 25 mg suppository Insert 1 suppository (25 mg total) into the rectum Two (2) times a day for 21 days. 42 suppository 2   ??? hydrocortisone (ANUSOL-HC) 25 mg suppository Insert 1 suppository (25 mg total) into the rectum at bedtime. 24 suppository 0   ??? hydrocortisone (CORTENEMA) 100 mg/60 mL enema Insert 1 enema (100 mg total) into the rectum nightly. 60 mL 0   ??? hydrocortisone (CORTENEMA) 100 mg/60 mL enema Insert 1 enema (100 mg total) into the rectum nightly. 60 mL 0   ??? lisinopriL (PRINIVIL,ZESTRIL) 40 MG tablet Take 40 mg by mouth daily.     ??? metFORMIN (GLUCOPHAGE) 500 MG tablet Take 1,000 mg by mouth 2 (two) times a day.     ??? methotrexate 25 mg/mL injection solution Inject 1 mL (25 mg total) under the skin every seven (7) days. 4 mL 3   ??? metoprolol tartrate (LOPRESSOR) 25 MG tablet Take 25 mg by mouth daily.     ??? rosuvastatin (CRESTOR) 20 MG tablet daily.     ??? syringe, disposable, 3 mL Syrg 1 each by Miscellaneous route every seven (7) days. For subcutaneous methotrexate injections 25 each 11   ??? UNABLE TO FIND daily. Med Name: CBD gummie     ??? vit A/vit C/vit E/zinc/copper (PRESERVISION AREDS ORAL) Take by mouth.       No current facility-administered medications for this visit.        Changes to medications: Justin Mora reports starting the following medications: hydrocortisone enema and suppositories    Allergies   Allergen Reactions   ??? Infliximab Rash     Drop in blood pressure  Sharp back pains     ??? Codeine Dizziness     Nausea  Blurred vision     ??? Escitalopram      Other reaction(s): fatigue, diarrhea, ED, Crohn's flareup       Changes to allergies: No    SPECIALTY MEDICATION ADHERENCE              Specialty medication(s) dose(s) confirmed: Patient reports changes to the regimen as follows: increased dosage to Q14 days     Are there any concerns with adherence? No    Adherence counseling provided? Not needed    CLINICAL MANAGEMENT AND INTERVENTION      Clinical Benefit Assessment:    Do you feel the medicine is effective or helping your condition? He still doesn't eat much  Clinical Benefit counseling provided? Progress note from 6/2 shows evidence of clinical benefit    Adverse Effects Assessment:    Are you experiencing any side effects? No    Are you experiencing difficulty administering your medicine? No    Quality of Life Assessment:    How many days over the past month did your ulcerative colitis  keep you from your normal activities? For example, brushing your teeth or getting up in the morning. stll having daily symptoms, but HC has helped.   Urgency isn't like it was    Have you discussed this with your provider? Yes    Acute Infection Status:    Acute infections noted within Epic:  No active infections  Patient reported infection: None    Therapy Appropriateness:    Is therapy appropriate? Yes, therapy is appropriate and should be continued    DISEASE/MEDICATION-SPECIFIC INFORMATION      For patients on injectable medications: Patient currently has 0 doses left.  Next injection is scheduled for 6/9 or ASAP.    PATIENT SPECIFIC NEEDS     - Does the patient have any physical, cognitive, or cultural barriers? No    - Is the patient high risk? No    - Does the patient require a Care Management Plan? No     - Does the patient require physician intervention or other additional services (i.e. nutrition, smoking cessation, social work)? No      SHIPPING     Specialty Medication(s) to be Shipped:   Inflammatory Disorders: Simponi    Other medication(s) to be shipped: No additional medications requested for fill at this time     Changes to insurance: No    Delivery Scheduled: Yes, Expected medication delivery date: 6/9.     Medication will be delivered via UPS to the confirmed prescription address in [facility].    The patient will receive a drug information handout for each medication shipped and additional FDA Medication Guides as required.  Verified that patient has previously received a Conservation officer, historic buildings and a Surveyor, mining.    The patient or caregiver noted above participated in the development of this care plan and knows that they can request review of or adjustments to the care plan at any time.      All of the patient's questions and concerns have been addressed.    Julianne Rice   [facility] Shared [facility] Specialty Pharmacist

## 2020-10-16 DIAGNOSIS — K51919 Ulcerative colitis, unspecified with unspecified complications: Principal | ICD-10-CM

## 2020-10-16 MED ORDER — METHOTREXATE SODIUM 25 MG/ML INJECTION SOLUTION
0 refills | 0 days
Start: 2020-10-16 — End: ?

## 2020-10-21 MED ORDER — METHOTREXATE SODIUM 25 MG/ML INJECTION SOLUTION
0 refills | 0.00000 days | Status: CP
Start: 2020-10-21 — End: ?

## 2020-11-01 DIAGNOSIS — E119 Type 2 diabetes mellitus without complications: Secondary | ICD-10-CM | POA: Diagnosis not present

## 2020-11-01 DIAGNOSIS — H25813 Combined forms of age-related cataract, bilateral: Secondary | ICD-10-CM | POA: Diagnosis not present

## 2020-11-01 DIAGNOSIS — H353132 Nonexudative age-related macular degeneration, bilateral, intermediate dry stage: Secondary | ICD-10-CM | POA: Diagnosis not present

## 2020-11-05 NOTE — Unmapped (Signed)
[facility] Refill Coordination Note    Specialty Medication(s) to be Shipped:   Inflammatory Disorders: Simponi    Other medication(s) to be shipped: No additional medications requested for fill at this time     Justin Mora, DOB: 03/20/1951  Phone: [phone] (home)       All above HIPAA information was verified with patient.     Was a Nurse, learning disability used for this call? No    Completed refill call assessment today to schedule patient's medication shipment from the [facility] [phone]).  All relevant notes have been reviewed.     Specialty medication(s) and dose(s) confirmed: Regimen is correct and unchanged.   Changes to medications: Justin Mora reports no changes at this time.  Changes to insurance: No  New side effects reported not previously addressed with a pharmacist or physician: None reported  Questions for the pharmacist: No    Confirmed patient received a Conservation officer, historic buildings and a Surveyor, mining with first shipment. The patient will receive a drug information handout for each medication shipped and additional FDA Medication Guides as required.       DISEASE/MEDICATION-SPECIFIC INFORMATION        For patients on injectable medications: Patient currently has 0 doses left.  Next injection is scheduled for 11/14/20.    SPECIALTY MEDICATION ADHERENCE     Medication Adherence    Patient reported X missed doses in the last month: 0  Specialty Medication: SIMPONI 100 mg/mL  Patient is on additional specialty medications: No  Patient is on more than two specialty medications: No  Any gaps in refill history greater than 2 weeks in the last 3 months: no  Demonstrates understanding of importance of adherence: yes  Informant: patient  Reliability of informant: reliable  Confirmed plan for next specialty medication refill: delivery by pharmacy  Refills needed for supportive medications: not needed              Were doses missed due to medication being on hold? No    SIMPONI 100 mg/mL: 0 days of medicine on hand       REFERRAL TO PHARMACIST     Referral to the pharmacist: Not needed      [facility]     Shipping address confirmed in Epic.     Delivery Scheduled: Yes, Expected medication delivery date: 11/13/20.     Medication will be delivered via UPS to the prescription address in Epic WAM.    Justin Mora   [facility] Specialty Technician

## 2020-11-07 DIAGNOSIS — E782 Mixed hyperlipidemia: Secondary | ICD-10-CM | POA: Diagnosis not present

## 2020-11-07 DIAGNOSIS — I129 Hypertensive chronic kidney disease with stage 1 through stage 4 chronic kidney disease, or unspecified chronic kidney disease: Secondary | ICD-10-CM | POA: Diagnosis not present

## 2020-11-07 DIAGNOSIS — N182 Chronic kidney disease, stage 2 (mild): Secondary | ICD-10-CM | POA: Diagnosis not present

## 2020-11-07 DIAGNOSIS — E1122 Type 2 diabetes mellitus with diabetic chronic kidney disease: Secondary | ICD-10-CM | POA: Diagnosis not present

## 2020-11-12 MED FILL — SIMPONI 100 MG/ML SUBCUTANEOUS PEN INJECTOR: SUBCUTANEOUS | 28 days supply | Qty: 2 | Fill #1

## 2020-12-02 DIAGNOSIS — K50919 Crohn's disease, unspecified, with unspecified complications: Principal | ICD-10-CM

## 2020-12-03 NOTE — Unmapped (Signed)
[facility] Refill Coordination Note    Specialty Medication(s) to be Shipped:   Inflammatory Disorders: Simponi    Other medication(s) to be shipped: No additional medications requested for fill at this time     Justin Mora, DOB: 06-09-1950  Phone: [phone] (home)       All above HIPAA information was verified with patient.     Was a Nurse, learning disability used for this call? No    Completed refill call assessment today to schedule patient's medication shipment from the [facility] [phone]).  All relevant notes have been reviewed.     Specialty medication(s) and dose(s) confirmed: Regimen is correct and unchanged.   Changes to medications: Justin Mora reports no changes at this time.  Changes to insurance: No  New side effects reported not previously addressed with a pharmacist or physician: None reported  Questions for the pharmacist: No    Confirmed patient received a Conservation officer, historic buildings and a Surveyor, mining with first shipment. The patient will receive a drug information handout for each medication shipped and additional FDA Medication Guides as required.       DISEASE/MEDICATION-SPECIFIC INFORMATION        For patients on injectable medications: Patient currently has 0 doses left.  Next injection is scheduled for 12/11/20.    SPECIALTY MEDICATION ADHERENCE     Medication Adherence    Patient reported X missed doses in the last month: 0  Specialty Medication: SIMPONI 100 mg/mL  Patient is on additional specialty medications: No  Patient is on more than two specialty medications: No  Any gaps in refill history greater than 2 weeks in the last 3 months: no  Demonstrates understanding of importance of adherence: yes  Informant: patient  Reliability of informant: reliable  Confirmed plan for next specialty medication refill: delivery by pharmacy  Refills needed for supportive medications: not needed              Were doses missed due to medication being on hold? No    SIMPONI 100 mg/mL: 0 days of medicine on hand       REFERRAL TO PHARMACIST     Referral to the pharmacist: Not needed      [facility]     Shipping address confirmed in Epic.     Delivery Scheduled: Yes, Expected medication delivery date: 12/06/20.     Medication will be delivered via UPS to the prescription address in Epic WAM.    Justin Mora   [facility] Specialty Technician

## 2020-12-05 MED FILL — SIMPONI 100 MG/ML SUBCUTANEOUS PEN INJECTOR: SUBCUTANEOUS | 28 days supply | Qty: 2 | Fill #2

## 2020-12-08 DIAGNOSIS — N182 Chronic kidney disease, stage 2 (mild): Secondary | ICD-10-CM | POA: Diagnosis not present

## 2020-12-08 DIAGNOSIS — E782 Mixed hyperlipidemia: Secondary | ICD-10-CM | POA: Diagnosis not present

## 2020-12-08 DIAGNOSIS — E1122 Type 2 diabetes mellitus with diabetic chronic kidney disease: Secondary | ICD-10-CM | POA: Diagnosis not present

## 2020-12-08 DIAGNOSIS — I129 Hypertensive chronic kidney disease with stage 1 through stage 4 chronic kidney disease, or unspecified chronic kidney disease: Secondary | ICD-10-CM | POA: Diagnosis not present

## 2020-12-09 ENCOUNTER — Encounter: Admit: 2020-12-09 | Discharge: 2020-12-09 | Payer: MEDICARE | Attending: Anesthesiology | Primary: Anesthesiology

## 2020-12-09 ENCOUNTER — Ambulatory Visit: Admit: 2020-12-09 | Discharge: 2020-12-09 | Payer: MEDICARE

## 2020-12-09 DIAGNOSIS — E669 Obesity, unspecified: Secondary | ICD-10-CM | POA: Diagnosis not present

## 2020-12-09 DIAGNOSIS — K922 Gastrointestinal hemorrhage, unspecified: Secondary | ICD-10-CM | POA: Diagnosis not present

## 2020-12-09 DIAGNOSIS — K529 Noninfective gastroenteritis and colitis, unspecified: Secondary | ICD-10-CM | POA: Diagnosis not present

## 2020-12-09 DIAGNOSIS — Z8719 Personal history of other diseases of the digestive system: Secondary | ICD-10-CM | POA: Diagnosis not present

## 2020-12-09 DIAGNOSIS — K921 Melena: Secondary | ICD-10-CM | POA: Diagnosis not present

## 2020-12-09 DIAGNOSIS — R197 Diarrhea, unspecified: Secondary | ICD-10-CM | POA: Diagnosis not present

## 2020-12-09 DIAGNOSIS — K6389 Other specified diseases of intestine: Secondary | ICD-10-CM | POA: Diagnosis not present

## 2020-12-09 DIAGNOSIS — K633 Ulcer of intestine: Secondary | ICD-10-CM | POA: Diagnosis not present

## 2020-12-09 DIAGNOSIS — K644 Residual hemorrhoidal skin tags: Secondary | ICD-10-CM | POA: Diagnosis not present

## 2020-12-09 DIAGNOSIS — E119 Type 2 diabetes mellitus without complications: Secondary | ICD-10-CM | POA: Diagnosis not present

## 2020-12-09 MED ADMIN — lactated Ringers infusion: INTRAVENOUS | @ 19:00:00 | Stop: 2020-12-09

## 2020-12-09 MED ADMIN — propofoL (DIPRIVAN) injection: INTRAVENOUS | @ 20:00:00 | Stop: 2020-12-09

## 2020-12-10 DIAGNOSIS — K50819 Crohn's disease of both small and large intestine with unspecified complications: Principal | ICD-10-CM

## 2020-12-10 MED ORDER — PREDNISONE 10 MG TABLET
ORAL_TABLET | ORAL | 0 refills | 0.00000 days | Status: CP
Start: 2020-12-10 — End: ?

## 2020-12-10 NOTE — Unmapped (Signed)
Per MD request above  pred taper called in  vedo therapy plan started  Patient notified of the plan and that we are pending vedo approval

## 2020-12-20 NOTE — Unmapped (Signed)
Entyvio approval in place. Patient notified with instructions to contact [facility] for copay card sign up and call infusion center to coordinate appointments.

## 2020-12-27 NOTE — Unmapped (Signed)
Specialty Medication(s): Simponi 100 mg/mL    Mr.Tall has been dis-enrolled from the [facility] specialty pharmacy services due to medication discontinuation resulting from progression of disease. The patient will start to receive Entyvio infusions instead.       Additional information provided to the patient: N/A - Patient called [facility] on 8/19 to inform us that he has stopped taking Simponi per MD instruction. Medication therapy change confirmed per Epic note on 8/2.    Oliva Bustard  [facility] Specialty Pharmacist

## 2020-12-27 NOTE — Unmapped (Signed)
Patient called in and stated that he is no longer taking Simponi and still has 2 doses on hand. Patient states he is switching to Entyvio infusions and will be calling infusion center to schedule appointment.

## 2021-01-14 DIAGNOSIS — K50819 Crohn's disease of both small and large intestine with unspecified complications: Principal | ICD-10-CM

## 2021-01-15 DIAGNOSIS — I10 Essential (primary) hypertension: Secondary | ICD-10-CM | POA: Diagnosis not present

## 2021-01-15 DIAGNOSIS — E1165 Type 2 diabetes mellitus with hyperglycemia: Secondary | ICD-10-CM | POA: Diagnosis not present

## 2021-01-15 DIAGNOSIS — R5383 Other fatigue: Secondary | ICD-10-CM | POA: Diagnosis not present

## 2021-01-22 DIAGNOSIS — L4059 Other psoriatic arthropathy: Secondary | ICD-10-CM | POA: Diagnosis not present

## 2021-01-22 DIAGNOSIS — F321 Major depressive disorder, single episode, moderate: Secondary | ICD-10-CM | POA: Diagnosis not present

## 2021-01-22 DIAGNOSIS — I10 Essential (primary) hypertension: Secondary | ICD-10-CM | POA: Diagnosis not present

## 2021-01-22 DIAGNOSIS — E782 Mixed hyperlipidemia: Secondary | ICD-10-CM | POA: Diagnosis not present

## 2021-01-22 DIAGNOSIS — Z0000 Encounter for general adult medical examination without abnormal findings: Secondary | ICD-10-CM | POA: Diagnosis not present

## 2021-01-22 DIAGNOSIS — E0821 Diabetes mellitus due to underlying condition with diabetic nephropathy: Secondary | ICD-10-CM | POA: Diagnosis not present

## 2021-02-03 DIAGNOSIS — K50819 Crohn's disease of both small and large intestine with unspecified complications: Principal | ICD-10-CM

## 2021-02-04 ENCOUNTER — Ambulatory Visit: Admit: 2021-02-04 | Discharge: 2021-02-05 | Payer: MEDICARE

## 2021-02-04 DIAGNOSIS — K50819 Crohn's disease of both small and large intestine with unspecified complications: Secondary | ICD-10-CM | POA: Diagnosis not present

## 2021-02-07 DIAGNOSIS — E1122 Type 2 diabetes mellitus with diabetic chronic kidney disease: Secondary | ICD-10-CM | POA: Diagnosis not present

## 2021-02-07 DIAGNOSIS — E782 Mixed hyperlipidemia: Secondary | ICD-10-CM | POA: Diagnosis not present

## 2021-02-07 DIAGNOSIS — N182 Chronic kidney disease, stage 2 (mild): Secondary | ICD-10-CM | POA: Diagnosis not present

## 2021-02-07 DIAGNOSIS — I129 Hypertensive chronic kidney disease with stage 1 through stage 4 chronic kidney disease, or unspecified chronic kidney disease: Secondary | ICD-10-CM | POA: Diagnosis not present

## 2021-02-08 MED ORDER — PREDNISONE 10 MG TABLET
ORAL_TABLET | 0 refills | 0 days
Start: 2021-02-08 — End: ?

## 2021-02-11 MED ORDER — PREDNISONE 10 MG TABLET
ORAL_TABLET | 0 refills | 0.00000 days
Start: 2021-02-11 — End: ?

## 2021-02-19 DIAGNOSIS — K5190 Ulcerative colitis, unspecified, without complications: Secondary | ICD-10-CM | POA: Diagnosis not present

## 2021-02-19 DIAGNOSIS — E782 Mixed hyperlipidemia: Secondary | ICD-10-CM | POA: Diagnosis not present

## 2021-02-19 DIAGNOSIS — F321 Major depressive disorder, single episode, moderate: Secondary | ICD-10-CM | POA: Diagnosis not present

## 2021-02-19 DIAGNOSIS — I10 Essential (primary) hypertension: Secondary | ICD-10-CM | POA: Diagnosis not present

## 2021-02-20 ENCOUNTER — Ambulatory Visit: Admit: 2021-02-20 | Discharge: 2021-02-21 | Payer: MEDICARE

## 2021-02-20 DIAGNOSIS — K50819 Crohn's disease of both small and large intestine with unspecified complications: Principal | ICD-10-CM

## 2021-03-14 MED ORDER — PREDNISONE 10 MG TABLET
ORAL_TABLET | 0 refills | 0.00000 days
Start: 2021-03-14 — End: ?

## 2021-03-18 MED ORDER — PREDNISONE 10 MG TABLET
ORAL_TABLET | 0 refills | 0.00000 days
Start: 2021-03-18 — End: ?

## 2021-03-21 DIAGNOSIS — K50819 Crohn's disease of both small and large intestine with unspecified complications: Principal | ICD-10-CM

## 2021-03-31 DIAGNOSIS — K51919 Ulcerative colitis, unspecified with unspecified complications: Principal | ICD-10-CM

## 2021-03-31 MED ORDER — METHOTREXATE SODIUM 25 MG/ML INJECTION SOLUTION
0 refills | 0.00000 days
Start: 2021-03-31 — End: ?

## 2021-04-12 ENCOUNTER — Encounter: Payer: Self-pay | Admitting: Emergency Medicine

## 2021-04-12 ENCOUNTER — Other Ambulatory Visit: Payer: Self-pay

## 2021-04-12 ENCOUNTER — Ambulatory Visit
Admission: EM | Admit: 2021-04-12 | Discharge: 2021-04-12 | Disposition: A | Discharge status: Home / Self Care | Payer: Medicare HMO | Attending: Emergency Medicine | Admitting: Emergency Medicine

## 2021-04-12 DIAGNOSIS — J0100 Acute maxillary sinusitis, unspecified: Secondary | ICD-10-CM

## 2021-04-12 MED ORDER — CEFDINIR 300 MG PO CAPS: 300.0000 mg | ORAL_CAPSULE | Freq: Two times a day (BID) | ORAL | 0 refills | Status: AC

## 2021-04-12 NOTE — ED Provider Notes (Signed)
[facility]    CSN: [id number] Arrival date & time: 04/12/21  1142      History   Chief Complaint Chief Complaint  Patient presents with   URI   Generalized Body Aches    HPI Benjamin Chapman is a 70 y.o. adult.  Patient presents with runny nose, congestion, sinus pressure, cough x1 week.  Also reports a low-grade fever of 100 last night.  Treatment with Tylenol but none taken today; Also treating with OTC sinus medication.  Denies rash, shortness of breath, vomiting, diarrhea, or other symptoms.  Medical history includes diabetes, hypertension, psoriatic arthritis.  The history is provided by the patient and medical records.   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 Authorizing Provider  cefdinir (OMNICEF) 300 MG capsule Take 1 capsule (300 mg total) by mouth 2 (two) times daily for 7 days. 04/12/21 04/19/21 Yes Sharion Balloon, NP  amLODipine (NORVASC) 10 MG tablet TAKE 1 TABLET BY MOUTH  DAILY 09/28/19   Adrian Prows, MD  aspirin EC 81 MG tablet Take 1 tablet (81 mg total) by mouth  daily. 08/01/20   Adrian Prows, MD  atorvastatin (LIPITOR) 10 MG tablet Take 1 tablet (10 mg total) by mouth daily. 02/06/20   Adrian Prows, MD  Golimumab ([place]) Inject into the skin.    [provider]  lisinopril (ZESTRIL) 40 MG tablet Take 1 tablet (40 mg total) by mouth daily. 02/06/20   Adrian Prows, MD  metFORMIN (GLUCOPHAGE) 500 MG tablet Take 1,000 mg by mouth AC breakfast.    [provider]  methotrexate 250 MG/10ML injection Inject 1 mL into the muscle once a week. 07/10/19   [provider]  metoprolol tartrate (LOPRESSOR) 25 MG tablet Take 0.5 tablets (12.5 mg total) by mouth 2 (two) times daily. 07/28/19   Adrian Prows, MD  nitroGLYCERIN (NITROSTAT) 0.4 MG SL tablet DISSOLVE 1 TABLET UNDER THE TONGUE EVERY 5 MINUTES AS  NEEDED FOR CHEST PAIN. MAX  OF 3 TABLETS IN 15 MINUTES. CALL 911 IF PAIN PERSISTS. 02/01/20   Adrian Prows, MD    Family History Family History  Problem Relation Age of Onset   Alzheimer's disease Mother    Stroke Mother    Diabetes Mother     Social History Social History   Tobacco Use   Smoking status: Former    Packs/day: 1.00    Years: 20.00    Pack years: 20.00    Types: Cigarettes    Quit date: 07/15/1998  Years since quitting: 22.7   Smokeless tobacco: Never  Vaping Use   Vaping Use: Never used  Substance Use Topics   Alcohol use: Yes    Comment: occasional   Drug use: Never     Allergies   Codeine, Infliximab, and Other   Review of Systems Review of Systems  Constitutional:  Positive for fever. Negative for chills.  HENT:  Positive for congestion, postnasal drip, rhinorrhea and sinus pressure. Negative for ear pain and sore throat.   Respiratory:  Positive for cough. Negative for shortness of breath.   Cardiovascular:  Negative for chest pain and palpitations.  Gastrointestinal:  Negative for diarrhea and vomiting.  Musculoskeletal:  Negative for arthralgias and back pain.  Skin:  Negative for color change and  rash.  All other systems reviewed and are negative.   Physical Exam Triage Vital Signs ED Triage Vitals  Enc Vitals Group     BP 04/12/21 1219 115/66     Pulse Rate 04/12/21 1219 (!) 57     Resp 04/12/21 1219 18     Temp 04/12/21 1219 98.1 F (36.7 C)     Temp Source 04/12/21 1219 Oral     SpO2 04/12/21 1219 94 %     Weight --      Height --      Head Circumference --      Peak Flow --      Pain Score 04/12/21 1224 4     Pain Loc --      Pain Edu? --      Excl. in [place]? --    No data found.  Updated Vital Signs BP 115/66 (BP Location: Left Arm)   Pulse (!) 57   Temp 98.1 F (36.7 C) (Oral)   Resp 18   SpO2 94%   Visual Acuity Right Eye Distance:   Left Eye Distance:   Bilateral Distance:    Right Eye Near:   Left Eye Near:    Bilateral Near:     Physical Exam Vitals and nursing note reviewed.  Constitutional:      General: She is not in acute distress.    Appearance: She is well-developed.  HENT:     Right Ear: Tympanic membrane normal.     Left Ear: Tympanic membrane normal.     Nose: Congestion and rhinorrhea present.     Mouth/Throat:     Mouth: Mucous membranes are moist.     Pharynx: Oropharynx is clear.  Cardiovascular:     Rate and Rhythm: Normal rate and regular rhythm.     Heart sounds: Normal heart sounds.  Pulmonary:     Effort: Pulmonary effort is normal. No respiratory distress.     Breath sounds: Normal breath sounds.  Musculoskeletal:     Cervical back: Neck supple.  Skin:    General: Skin is warm and dry.  Neurological:     Mental Status: She is alert.  Psychiatric:        Mood and Affect: Mood normal.        Behavior: Behavior normal.     UC Treatments / Results  Labs (all labs ordered are listed, but only abnormal results are displayed) Labs Reviewed - No data to display  EKG   Radiology No results found.  Procedures Procedures (including critical care time)  Medications Ordered in UC Medications - No data to  display  Initial Impression / Assessment and Plan / UC Course  I have reviewed the triage vital signs  and the nursing notes.  Pertinent labs & imaging results that were available during my care of the patient were reviewed by me and considered in my medical decision making (see chart for details).   Acute sinusitis.  Not improving with OTC treatment.  Treating with cefdinir.  Instructed patient to follow-up with PCP if symptoms are not improving.  Patient agrees to plan of care.   Final Clinical Impressions(s) / UC Diagnoses   Final diagnoses:  Acute non-recurrent maxillary sinusitis     Discharge Instructions      Take the antibiotic as directed.  Follow up with your primary care provider if your symptoms are not improving.         ED Prescriptions     Medication Sig Dispense Auth. Provider   cefdinir (OMNICEF) 300 MG capsule Take 1 capsule (300 mg total) by mouth 2 (two) times daily for 7 days. 14 capsule Sharion Balloon, NP      PDMP not reviewed this encounter.   Sharion Balloon, NP 04/12/21 1300

## 2021-04-12 NOTE — Discharge Instructions (Addendum)
Take the antibiotic as directed.  Follow up with your primary care provider if your symptoms are not improving.     

## 2021-04-12 NOTE — ED Triage Notes (Signed)
Pt here with URI and body aches x 1 week.

## 2021-04-13 MED ORDER — FOLIC ACID 1 MG TABLET
ORAL_TABLET | 3 refills | 0.00000 days
Start: 2021-04-13 — End: ?

## 2021-04-14 MED ORDER — FOLIC ACID 1 MG TABLET
ORAL_TABLET | 3 refills | 0 days | Status: CP
Start: 2021-04-14 — End: ?

## 2021-05-07 ENCOUNTER — Ambulatory Visit
Admission: EM | Admit: 2021-05-07 | Discharge: 2021-05-07 | Disposition: A | Discharge status: Home / Self Care | Payer: Medicare HMO | Attending: Medical Oncology | Admitting: Medical Oncology

## 2021-05-07 ENCOUNTER — Encounter: Payer: Self-pay | Admitting: Emergency Medicine

## 2021-05-07 ENCOUNTER — Other Ambulatory Visit: Payer: Self-pay

## 2021-05-07 DIAGNOSIS — R051 Acute cough: Secondary | ICD-10-CM | POA: Diagnosis not present

## 2021-05-07 DIAGNOSIS — J0100 Acute maxillary sinusitis, unspecified: Secondary | ICD-10-CM

## 2021-05-07 MED ORDER — FLUTICASONE PROPIONATE 50 MCG/ACT NA SUSP: 2.0000 | Freq: Every day | NASAL | 0 refills | Status: DC

## 2021-05-07 MED ORDER — DOXYCYCLINE HYCLATE 100 MG PO CAPS: 100.0000 mg | ORAL_CAPSULE | Freq: Two times a day (BID) | ORAL | 0 refills | Status: DC

## 2021-05-07 MED ORDER — BENZONATATE 100 MG PO CAPS: 100.0000 mg | ORAL_CAPSULE | Freq: Three times a day (TID) | ORAL | 0 refills | Status: DC

## 2021-05-07 NOTE — ED Provider Notes (Signed)
Benjamin Chapman    CSN: [id number] Arrival date & time: 05/07/21  1572      History   Chief Complaint Chief Complaint  Patient presents with   Cough   Nasal Congestion   Generalized Body Aches    HPI Benjamin Chapman is a 70 y.o. adult.   HPI  Cold Symptoms: Patient reports that they have had symptoms of dry cough, nasal congestion, body aches for the past 4 days. Symptoms are stable. They deny SOB, chest pain, fever or vomiting. They have tried Alkaseltzer for symptoms. Family members sick with similar symptoms.    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 Authorizing Provider  amLODipine (NORVASC) 10 MG tablet TAKE 1 TABLET BY MOUTH  DAILY 09/28/19   Adrian Prows, MD  aspirin EC 81 MG tablet Take 1 tablet (81 mg total) by mouth daily. 08/01/20   Adrian Prows, MD  atorvastatin (LIPITOR) 10 MG tablet Take 1 tablet (10 mg total) by mouth daily. 02/06/20   Adrian Prows, MD  Golimumab ([place]) Inject into the skin.    [provider]  lisinopril (ZESTRIL) 40 MG tablet Take 1 tablet  (40 mg total) by mouth daily. 02/06/20   Adrian Prows, MD  metFORMIN (GLUCOPHAGE) 500 MG tablet Take 1,000 mg by mouth AC breakfast.    [provider]  methotrexate 250 MG/10ML injection Inject 1 mL into the muscle once a week. 07/10/19   [provider]  metoprolol tartrate (LOPRESSOR) 25 MG tablet Take 0.5 tablets (12.5 mg total) by mouth 2 (two) times daily. 07/28/19   Adrian Prows, MD  nitroGLYCERIN (NITROSTAT) 0.4 MG SL tablet DISSOLVE 1 TABLET UNDER THE TONGUE EVERY 5 MINUTES AS  NEEDED FOR CHEST PAIN. MAX  OF 3 TABLETS IN 15 MINUTES. CALL 911 IF PAIN PERSISTS. 02/01/20   Adrian Prows, MD    Family History Family History  Problem Relation Age of Onset   Alzheimer's disease Mother    Stroke Mother    Diabetes Mother     Social History Social History   Tobacco Use   Smoking status: Former    Packs/day: 1.00    Years: 20.00    Pack years: 20.00    Types: Cigarettes    Quit date: 07/15/1998    Years since quitting: 22.8   Smokeless tobacco: Never  Vaping Use   Vaping Use: Never used  Substance Use Topics   Alcohol use: Yes    Comment: occasional   Drug use: Never  Allergies   Codeine, Infliximab, and Other   Review of Systems Review of Systems  As stated above in HPI Physical Exam Triage Vital Signs ED Triage Vitals  Enc Vitals Group     BP 05/07/21 0919 (!) 143/78     Pulse Rate 05/07/21 0919 64     Resp 05/07/21 0919 20     Temp 05/07/21 0919 97.9 F (36.6 C)     Temp Source 05/07/21 0919 Oral     SpO2 05/07/21 0919 97 %     Weight --      Height --      Head Circumference --      Peak Flow --      Pain Score 05/07/21 0920 5     Pain Loc --      Pain Edu? --      Excl. in [place]? --    No data found.  Updated Vital Signs BP (!) 143/78    Pulse 64    Temp 97.9 F (36.6 C) (Oral)    Resp 20    SpO2 97%   Physical Exam Vitals and nursing note reviewed.  Constitutional:      General: She is not in acute distress.    Appearance: Normal  appearance. She is not ill-appearing, toxic-appearing or diaphoretic.     Comments: Patient sounds very congested  HENT:     Head: Normocephalic and atraumatic.     Ears:     Comments: Mild to moderate middle ear effusion    Nose: Congestion (Maxillary sinus bogginess and tenderness) present. No rhinorrhea.     Mouth/Throat:     Mouth: Mucous membranes are moist.     Pharynx: No oropharyngeal exudate or posterior oropharyngeal erythema.  Eyes:     General:        Right eye: No discharge.        Left eye: No discharge.     Extraocular Movements: Extraocular movements intact.     Pupils: Pupils are equal, round, and reactive to light.  Cardiovascular:     Rate and Rhythm: Normal rate and regular rhythm.     Heart sounds: Normal heart sounds.  Pulmonary:     Effort: Pulmonary effort is normal.     Breath sounds: Normal breath sounds.  Musculoskeletal:     Cervical back: Normal range of motion and neck supple.  Lymphadenopathy:     Cervical: No cervical adenopathy.  Skin:    General: Skin is warm.  Neurological:     Mental Status: She is alert and oriented to person, place, and time.     UC Treatments / Results  Labs (all labs ordered are listed, but only abnormal results are displayed) Labs Reviewed - No data to display  EKG   Radiology No results found.  Procedures Procedures (including critical care time)  Medications Ordered in UC Medications - No data to display  Initial Impression / Assessment and Plan / UC Course  I have reviewed the triage vital signs and the nursing notes.  Pertinent labs & imaging results that were available during my care of the patient were reviewed by me and considered in my medical decision making (see chart for details).     New. Appears viral in nature which has now progressed to sinusitis. Treating with rest, hydration with water, tessalon, Doxy(As Augmentin interacts with his medications) and flonase. Discussed red flag signs and  symptoms. Follow up PRN.  Final Clinical Impressions(s) / UC Diagnoses  Final diagnoses:  None   Discharge Instructions   None    ED Prescriptions   None    PDMP not reviewed this encounter.   Hughie Closs, [place] 05/07/21 [phone]

## 2021-05-07 NOTE — ED Triage Notes (Signed)
Pt here with new URI and body aches without fever x 4 days.

## 2021-05-13 DIAGNOSIS — K50819 Crohn's disease of both small and large intestine with unspecified complications: Principal | ICD-10-CM

## 2021-05-22 ENCOUNTER — Ambulatory Visit: Admit: 2021-05-22 | Discharge: 2021-05-23 | Payer: MEDICARE | Attending: Gastroenterology | Primary: Gastroenterology

## 2021-05-22 DIAGNOSIS — K50819 Crohn's disease of both small and large intestine with unspecified complications: Principal | ICD-10-CM

## 2021-05-22 DIAGNOSIS — K529 Noninfective gastroenteritis and colitis, unspecified: Principal | ICD-10-CM

## 2021-05-22 DIAGNOSIS — K50919 Crohn's disease, unspecified, with unspecified complications: Principal | ICD-10-CM

## 2021-05-22 MED ORDER — RINVOQ 45 MG TABLET,EXTENDED RELEASE
ORAL_TABLET | Freq: Every day | ORAL | 0 refills | 56 days | Status: CP
Start: 2021-05-22 — End: 2021-07-17

## 2021-05-22 MED ORDER — UPADACITINIB ER 30 MG TABLET,EXTENDED RELEASE 24 HR
ORAL_TABLET | Freq: Every day | ORAL | 3 refills | 30 days | Status: CP
Start: 2021-05-22 — End: 2022-05-22

## 2021-05-27 DIAGNOSIS — M25512 Pain in left shoulder: Secondary | ICD-10-CM | POA: Diagnosis not present

## 2021-05-27 DIAGNOSIS — M1990 Unspecified osteoarthritis, unspecified site: Secondary | ICD-10-CM | POA: Diagnosis not present

## 2021-05-27 DIAGNOSIS — L409 Psoriasis, unspecified: Secondary | ICD-10-CM | POA: Diagnosis not present

## 2021-05-27 DIAGNOSIS — M25559 Pain in unspecified hip: Secondary | ICD-10-CM | POA: Diagnosis not present

## 2021-05-27 DIAGNOSIS — L4059 Other psoriatic arthropathy: Secondary | ICD-10-CM | POA: Diagnosis not present

## 2021-05-27 DIAGNOSIS — E119 Type 2 diabetes mellitus without complications: Secondary | ICD-10-CM | POA: Diagnosis not present

## 2021-05-27 DIAGNOSIS — D751 Secondary polycythemia: Secondary | ICD-10-CM | POA: Diagnosis not present

## 2021-05-27 DIAGNOSIS — K5190 Ulcerative colitis, unspecified, without complications: Secondary | ICD-10-CM | POA: Diagnosis not present

## 2021-05-27 DIAGNOSIS — M25551 Pain in right hip: Secondary | ICD-10-CM | POA: Diagnosis not present

## 2021-05-27 DIAGNOSIS — Z79899 Other long term (current) drug therapy: Secondary | ICD-10-CM | POA: Diagnosis not present

## 2021-05-27 DIAGNOSIS — M25511 Pain in right shoulder: Secondary | ICD-10-CM | POA: Diagnosis not present

## 2021-06-25 MED ORDER — PREDNISONE 10 MG TABLET
ORAL_TABLET | ORAL | 0 refills | 42 days | Status: CP
Start: 2021-06-25 — End: 2021-08-06

## 2021-06-27 MED ORDER — RINVOQ 45 MG TABLET,EXTENDED RELEASE
ORAL_TABLET | Freq: Every day | ORAL | 0 refills | 56 days | Status: CP
Start: 2021-06-27 — End: 2021-08-22

## 2021-06-27 MED ORDER — UPADACITINIB ER 30 MG TABLET,EXTENDED RELEASE 24 HR
ORAL_TABLET | Freq: Every day | ORAL | 3 refills | 30.00000 days | Status: CP
Start: 2021-06-27 — End: 2022-06-27

## 2021-07-03 DIAGNOSIS — K50819 Crohn's disease of both small and large intestine with unspecified complications: Principal | ICD-10-CM

## 2021-07-03 DIAGNOSIS — Z79899 Other long term (current) drug therapy: Principal | ICD-10-CM

## 2021-07-13 DIAGNOSIS — K50819 Crohn's disease of both small and large intestine with unspecified complications: Principal | ICD-10-CM

## 2021-07-15 ENCOUNTER — Other Ambulatory Visit: Payer: Medicare HMO

## 2021-07-16 ENCOUNTER — Ambulatory Visit: Payer: Medicare HMO

## 2021-07-16 ENCOUNTER — Other Ambulatory Visit: Payer: Self-pay

## 2021-07-16 DIAGNOSIS — R0989 Other specified symptoms and signs involving the circulatory and respiratory systems: Secondary | ICD-10-CM | POA: Diagnosis not present

## 2021-07-22 MED ORDER — PREDNISONE 10 MG TABLET
ORAL_TABLET | ORAL | 0 refills | 0 days
Start: 2021-07-22 — End: ?

## 2021-07-23 DIAGNOSIS — K50819 Crohn's disease of both small and large intestine with unspecified complications: Secondary | ICD-10-CM | POA: Diagnosis not present

## 2021-07-23 DIAGNOSIS — Z79899 Other long term (current) drug therapy: Secondary | ICD-10-CM | POA: Diagnosis not present

## 2021-07-23 MED ORDER — PREDNISONE 10 MG TABLET
ORAL_TABLET | ORAL | 0 refills | 42 days
Start: 2021-07-23 — End: 2021-09-03

## 2021-07-28 ENCOUNTER — Encounter: Payer: Self-pay | Admitting: Cardiology

## 2021-07-28 ENCOUNTER — Ambulatory Visit: Payer: Medicare HMO | Admitting: Cardiology

## 2021-07-28 ENCOUNTER — Other Ambulatory Visit: Payer: Self-pay

## 2021-07-28 VITALS — BP 129/67 | HR 67 | Temp 98.0°F | Resp 16 | Ht 67.0 in | Wt 181.0 lb

## 2021-07-28 DIAGNOSIS — I10 Essential (primary) hypertension: Secondary | ICD-10-CM | POA: Diagnosis not present

## 2021-07-28 DIAGNOSIS — R0989 Other specified symptoms and signs involving the circulatory and respiratory systems: Secondary | ICD-10-CM | POA: Diagnosis not present

## 2021-07-28 DIAGNOSIS — I2510 Atherosclerotic heart disease of native coronary artery without angina pectoris: Secondary | ICD-10-CM

## 2021-07-28 DIAGNOSIS — R9439 Abnormal result of other cardiovascular function study: Secondary | ICD-10-CM | POA: Diagnosis not present

## 2021-07-28 NOTE — Progress Notes (Signed)
? ?Primary Physician/Referring:  [name], MD ? ?Patient ID: Benjamin Chapman, male    DOB: 15-May-1950, 71 y.o.   MRN: [id number] ? ?Chief Complaint  ?Patient presents with  ? Chest Pain  ? Follow-up  ?  1 year  ? ?HPI:   ? ?Benjamin Chapman  is a 71 y.o. psoriatic arthritis, Hypertension, hyperlipidemia, type 2 diabetes, hypertension, ulcerative colitis, and psoriasis who presents for a one yesr  OV for evaluation of angina pectoris, he has had abnormal stress test 07/08/2018 imaging day moderate size inferolateral wall ischemia considered high risk on a submaximal stress test.  Echocardiogram at that time revealed trace aortic stenosis and mild diastolic dysfunction. ? ?Since being on aggressive medical therapy, he has not had any further episodes of chest pain and feels well.  He did have Covid 19 in Feb 2021. Diarrhea is his main complaint.  As I was delayed, patient could not wait hence I made a telephone encounter call. ? ?Past Medical History:  ?Diagnosis Date  ? Bilateral carotid bruits   ? Diabetes mellitus without complication ([place])   ? Dyspnea   ? Hypertension   ? Psoriatic arthritis ([place])   ? Systolic murmur of aorta   ? Ulcerative colitis, acute ([place])   ? ?Social History  ? ?Tobacco Use  ? Smoking status: Former  ?  Packs/day: 1.00  ?  Years: 20.00  ?  Pack years: 20.00  ?  Types: Cigarettes  ?  Quit date: 07/15/1998  ?  Years since quitting: 23.0  ? Smokeless tobacco: Never  ?Substance Use Topics  ? Alcohol use: Yes  ?  Comment: occasional  ? ?ROS  ?Review of Systems  ?Cardiovascular:  Negative for chest pain, dyspnea on exertion and leg swelling.  ?Gastrointestinal:  Positive for diarrhea.  ?Objective  ?Blood pressure 129/67, pulse 67, temperature 98 ?F (36.7 ?C), resp. rate 16, height _0  (1.702 m), weight 181 lb (82.1 kg), SpO2 97 %.  ?Vitals with BMI 07/28/2021 05/07/2021 04/12/2021  ?Height _1  - -  ?Weight 181 lbs - -  ?BMI 28.34 - -  ?Systolic 237 628 315  ?Diastolic 67 78 66  ?Pulse 67  64 57  ?  ?Physical exam not performed or limited due to virtual visit.   ?Laboratory examination:  ? ?No results for input(s): NA, K, CL, CO2, GLUCOSE, BUN, CREATININE, CALCIUM, GFRNONAA, GFRAA in the last 8760 hours. ?CrCl cannot be calculated (Patient's most recent lab result is older than the maximum 21 days allowed.).  ?CMP Latest Ref Rng & Units 01/11/2019 11/19/2015 11/19/2015  ?Glucose 65 - 99 mg/dL 260(H) 202(H) -  ?BUN 8 - 27 mg/dL 7(L) 10.3 -  ?Creatinine 0.76 - 1.27 mg/dL 0.82 0.8 -  ?Sodium 134 - 144 mmol/L 134 137 -  ?Potassium 3.5 - 5.2 mmol/L 4.2 3.8 -  ?Chloride 96 - 106 mmol/L 99 - -  ?CO2 20 - 29 mmol/L 23 23 -  ?Calcium 8.6 - 10.2 mg/dL 9.5 9.4 -  ?Total Protein 6.0 - 8.5 g/dL 7.2 8.0 7.4  ?Total Bilirubin 0.0 - 1.2 mg/dL <0.2 0.48 -  ?Alkaline Phos 39 - 117 IU/L 74 67 -  ?AST 0 - 40 IU/L 18 29 -  ?ALT 0 - 44 IU/L 17 39 -  ? ?CBC Latest Ref Rng & Units 11/19/2015  ?WBC 4.0 - 10.3 10e3/uL 9.2  ?Hemoglobin 13.0 - 17.1 g/dL 15.8  ?Hematocrit 38.4 - 49.9 % 44.7  ?Platelets 140 - 400 10e3/uL 189  ? ?  Lipid Panel  ?   ?Component Value Date/Time  ? CHOL 113 01/11/2019 0929  ? TRIG 183 (H) 01/11/2019 0929  ? HDL 44 01/11/2019 0929  ? [place] 39 01/11/2019 0929  ? ?External labs:  ?Labs 07/23/2021: ? ?Serum glucose 100 mg, BUN 15, creatinine 0.87, EGFR 92, LFTs normal. ? ?Hb 13.2/HCT 41.7, platelets 355. ? ?Total cholesterol 143, triglycerides 169, HDL 70, LDL 46. ? ?CRP 27. ? ?Labs 01/15/2021: ? ?Total cholesterol 157, triglycerides 161, HDL 86, LDL 39. ? ?TSH normal at 1.37. ? ?Hb 14.2/HCT 44.2, platelets 211. ? ?BUN 12, creatinine 0.91, EGFR 84,, potassium 3.9.  LFTs normal.  ? ?Medications and allergies  ? ?Allergies  ?Allergen Reactions  ? Codeine Other (See Comments)  ?  Nausea ?  ? Infliximab Other (See Comments)  ? Other Other (See Comments)  ? ? ?Current Outpatient Medications:  ?  amLODipine (NORVASC) 10 MG tablet, TAKE 1 TABLET BY MOUTH  DAILY, Disp: 90 tablet, Rfl: 3 ?  atorvastatin (LIPITOR) 10 MG  tablet, Take 1 tablet (10 mg total) by mouth daily., Disp: 90 tablet, Rfl: 3 ?  clonazePAM (KLONOPIN) 0.5 MG tablet, Take by mouth., Disp: , Rfl:  ?  escitalopram (LEXAPRO) 5 MG tablet, Take by mouth., Disp: , Rfl:  ?  lisinopril (ZESTRIL) 40 MG tablet, Take 1 tablet (40 mg total) by mouth daily., Disp: 90 tablet, Rfl: 3 ?  metFORMIN (GLUCOPHAGE) 500 MG tablet, Take 1,000 mg by mouth AC breakfast., Disp: , Rfl:  ?  metoprolol tartrate (LOPRESSOR) 25 MG tablet, Take 0.5 tablets (12.5 mg total) by mouth 2 (two) times daily., Disp: 180 tablet, Rfl: 3 ?  nitroGLYCERIN (NITROSTAT) 0.4 MG SL tablet, DISSOLVE 1 TABLET UNDER THE TONGUE EVERY 5 MINUTES AS  NEEDED FOR CHEST PAIN. MAX  OF 3 TABLETS IN 15 MINUTES. CALL 911 IF PAIN PERSISTS., Disp: 100 tablet, Rfl: 3 ?  traZODone (DESYREL) 50 MG tablet, Take 1-2 tablets by mouth at bedtime as needed., Disp: , Rfl:   ?  ?Radiology:  ? ?No results found. ? ?Cardiac Studies:  ? ?Exercise myoview stress 07/08/2018: ?1. The patient performed treadmill exercise using Bruce protocol, completing 8:30 minutes. The patient completed an estimated workload of 8.9 METS, reaching 82% of the maximum predicted heart rate. Exercise capacity was normal. Abnormal hemodynamic response was seen- Resting hypertension 162/86 mmHg, without expected increase in blood pressure with peak exercise-166/80 mmHg. Stress symptoms included fatigue. No ischemic changes seen on stress electrocardiogram. ?2. The overall quality of the study is good.  Left ventricular cavity is noted to be normal on the rest and stress studies.  Gated SPECT imaging demonstrates hypokinesis of the basal inferior, basal inferolateral, mid inferior and mid inferolateral myocardial wall(s).  The left ventricular ejection fraction was calculated or visually estimated to be 60%.  Moderate sized, medium intensity, predominantly reversible perfusion defect in mid to basal inferior/inferolateral myocardium, suggestive of small infarct with  moderate peri infarct ischemia in LCx/PDA territory.  ?3. High risk study with submaximal stress. ? ?Echocardiogram  07/07/2018: ?1. Left ventricle cavity is normal in size. Normal global wall motion. Doppler evidence of grade I (impaired) diastolic dysfunction, elevated LVEDP/LA pressure. Calculated EF 55%. ?2. Mild aortic valve leaflet calcification. Mildly restricted aortic valve leaflets. Trace aortic valve stenosis. Peak gradient 22, mean gradient 10 mmHg, AVA 1.77 cm. No aortic valve regurgitation noted. ?3. Trace tricuspid regurgitation. Unable to estimate PA pressure due to absence/minimal TR signal. ?  ?Carotid artery duplex 07/16/2021: ?Duplex suggests  stenosis in the right internal carotid artery (1-15%). Duplex suggests stenosis in the right external carotid artery (<50%).  ?Duplex suggests stenosis in the left internal carotid artery (1-15%). Duplex suggests stenosis in the left external carotid artery (<50%). ?There is homogeneous plaque throughout bilateral CCA and heterogeneous plaque in bilateral carotid bulb.  ?Antegrade right vertebral artery flow. Antegrade left vertebral artery flow. ?No significant change from 07/07/2018. Follow up when appropriate if clinically indicated. ? ?Abdominal Aortic Duplex  07/07/2018 : ?No AAA observed. Normal iliac artery velocity. Large fluid filled area below naval measuring 7.3 cm.  Appears to be a simple cyst, not well studied. If clinically indicated, consider general US abdomen. See image. ? ?EKG ? ?EKG 07/28/2021: Normal sinus rhythm at the rate of 65 bpm, left atrialenlargement, otherwise normal EKG.  ? ?Assessment  ? ?  ICD-10-CM   ?1. Abnormal nuclear stress test  R94.39   ?  ?2. Essential hypertension  I10 EKG 12-Lead  ?  ?3. Coronary artery calcification seen on CT scan  I25.10   ?  ?4. Bilateral carotid bruits  R09.89   ?  ?  ?No orders of the defined types were placed in this encounter. ?  ?Medications Discontinued During This Encounter  ?Medication  Reason  ? aspirin EC 81 MG tablet   ? benzonatate (TESSALON) 100 MG capsule   ? doxycycline (VIBRAMYCIN) 100 MG capsule   ? Golimumab (SIMPONI [place])   ? fluticasone (FLONASE) 50 MCG/ACT nasal spray   ? metho

## 2021-08-07 MED ORDER — PREDNISONE 10 MG TABLET
ORAL_TABLET | 0 refills | 0 days | Status: CP
Start: 2021-08-07 — End: ?

## 2021-08-14 ENCOUNTER — Telehealth: Admit: 2021-08-14 | Discharge: 2021-08-15 | Attending: Gastroenterology | Primary: Gastroenterology

## 2021-08-14 DIAGNOSIS — K51919 Ulcerative colitis, unspecified with unspecified complications: Secondary | ICD-10-CM | POA: Diagnosis not present

## 2021-08-19 DIAGNOSIS — K50819 Crohn's disease of both small and large intestine with unspecified complications: Principal | ICD-10-CM

## 2021-08-19 MED ORDER — CERTOLIZUMAB PEGOL 400 MG/2 ML (200 MG/ML X2) SUBCUTANEOUS SYRINGE KIT
SUBCUTANEOUS | 11 refills | 28.00000 days | Status: CP
Start: 2021-08-19 — End: ?

## 2021-08-25 DIAGNOSIS — K51919 Ulcerative colitis, unspecified with unspecified complications: Principal | ICD-10-CM

## 2021-08-25 DIAGNOSIS — K50819 Crohn's disease of both small and large intestine with unspecified complications: Principal | ICD-10-CM

## 2021-08-25 MED ORDER — SIMPONI 100 MG/ML SUBCUTANEOUS PEN INJECTOR
SUBCUTANEOUS | 11 refills | 28.00000 days
Start: 2021-08-25 — End: ?

## 2021-08-26 MED ORDER — SIMPONI 100 MG/ML SUBCUTANEOUS PEN INJECTOR
SUBCUTANEOUS | 11 refills | 28 days | Status: CP
Start: 2021-08-26 — End: ?

## 2021-08-27 DIAGNOSIS — K51919 Ulcerative colitis, unspecified with unspecified complications: Principal | ICD-10-CM

## 2021-09-11 DIAGNOSIS — E0821 Diabetes mellitus due to underlying condition with diabetic nephropathy: Secondary | ICD-10-CM | POA: Diagnosis not present

## 2021-09-11 DIAGNOSIS — E782 Mixed hyperlipidemia: Secondary | ICD-10-CM | POA: Diagnosis not present

## 2021-09-11 DIAGNOSIS — I10 Essential (primary) hypertension: Secondary | ICD-10-CM | POA: Diagnosis not present

## 2021-09-17 DIAGNOSIS — F321 Major depressive disorder, single episode, moderate: Secondary | ICD-10-CM | POA: Diagnosis not present

## 2021-09-17 DIAGNOSIS — Z0000 Encounter for general adult medical examination without abnormal findings: Secondary | ICD-10-CM | POA: Diagnosis not present

## 2021-09-17 DIAGNOSIS — Z85820 Personal history of malignant melanoma of skin: Secondary | ICD-10-CM | POA: Diagnosis not present

## 2021-09-17 DIAGNOSIS — R42 Dizziness and giddiness: Secondary | ICD-10-CM | POA: Diagnosis not present

## 2021-09-17 DIAGNOSIS — Z862 Personal history of diseases of the blood and blood-forming organs and certain disorders involving the immune mechanism: Secondary | ICD-10-CM | POA: Diagnosis not present

## 2021-09-17 DIAGNOSIS — L4050 Arthropathic psoriasis, unspecified: Secondary | ICD-10-CM | POA: Diagnosis not present

## 2021-09-17 DIAGNOSIS — R29818 Other symptoms and signs involving the nervous system: Secondary | ICD-10-CM | POA: Diagnosis not present

## 2021-09-17 DIAGNOSIS — K5080 Crohn's disease of both small and large intestine without complications: Secondary | ICD-10-CM | POA: Diagnosis not present

## 2021-09-17 DIAGNOSIS — I2510 Atherosclerotic heart disease of native coronary artery without angina pectoris: Secondary | ICD-10-CM | POA: Diagnosis not present

## 2021-09-17 DIAGNOSIS — E1169 Type 2 diabetes mellitus with other specified complication: Secondary | ICD-10-CM | POA: Diagnosis not present

## 2021-09-17 DIAGNOSIS — I10 Essential (primary) hypertension: Secondary | ICD-10-CM | POA: Diagnosis not present

## 2021-09-17 DIAGNOSIS — D84821 Immunodeficiency due to drugs: Secondary | ICD-10-CM | POA: Diagnosis not present

## 2021-09-18 DIAGNOSIS — N182 Chronic kidney disease, stage 2 (mild): Secondary | ICD-10-CM | POA: Diagnosis not present

## 2021-09-18 DIAGNOSIS — E782 Mixed hyperlipidemia: Secondary | ICD-10-CM | POA: Diagnosis not present

## 2021-09-18 DIAGNOSIS — K5190 Ulcerative colitis, unspecified, without complications: Secondary | ICD-10-CM | POA: Diagnosis not present

## 2021-09-18 DIAGNOSIS — E1165 Type 2 diabetes mellitus with hyperglycemia: Secondary | ICD-10-CM | POA: Diagnosis not present

## 2021-09-18 DIAGNOSIS — I10 Essential (primary) hypertension: Secondary | ICD-10-CM | POA: Diagnosis not present

## 2021-09-25 ENCOUNTER — Ambulatory Visit: Admit: 2021-09-25 | Discharge: 2021-09-26 | Payer: MEDICARE | Attending: Gastroenterology | Primary: Gastroenterology

## 2021-09-25 DIAGNOSIS — K51919 Ulcerative colitis, unspecified with unspecified complications: Principal | ICD-10-CM

## 2021-10-08 DIAGNOSIS — R001 Bradycardia, unspecified: Secondary | ICD-10-CM | POA: Diagnosis not present

## 2021-10-16 MED ORDER — PREDNISONE 10 MG TABLET
ORAL_TABLET | 0 refills | 0 days
Start: 2021-10-16 — End: ?

## 2021-10-20 ENCOUNTER — Ambulatory Visit: Admit: 2021-10-20 | Discharge: 2021-10-21 | Payer: MEDICARE

## 2021-10-20 DIAGNOSIS — K51919 Ulcerative colitis, unspecified with unspecified complications: Secondary | ICD-10-CM | POA: Diagnosis not present

## 2021-10-20 MED ORDER — ERYTHROMYCIN 250 MG TABLET
ORAL_TABLET | 0 refills | 0 days | Status: CP
Start: 2021-10-20 — End: ?

## 2021-10-20 MED ORDER — BISACODYL 5 MG TABLET,DELAYED RELEASE
ORAL_TABLET | Freq: Once | ORAL | 0 refills | 1 days | Status: CP
Start: 2021-10-20 — End: 2021-10-20

## 2021-10-20 MED ORDER — PREDNISONE 10 MG TABLET
ORAL_TABLET | 0 refills | 0 days
Start: 2021-10-20 — End: ?

## 2021-10-20 MED ORDER — CIPROFLOXACIN 500 MG TABLET
ORAL_TABLET | 0 refills | 0 days | Status: CP
Start: 2021-10-20 — End: ?

## 2021-10-20 MED ORDER — POLYETHYLENE GLYCOL 3350 17 GRAM ORAL POWDER PACKET
PACK | Freq: Once | ORAL | 0 refills | 1 days | Status: CP
Start: 2021-10-20 — End: 2021-10-20

## 2021-10-21 DIAGNOSIS — K50819 Crohn's disease of both small and large intestine with unspecified complications: Principal | ICD-10-CM

## 2021-10-24 ENCOUNTER — Ambulatory Visit: Admit: 2021-10-24 | Discharge: 2021-10-25 | Payer: MEDICARE

## 2021-10-24 DIAGNOSIS — K51919 Ulcerative colitis, unspecified with unspecified complications: Principal | ICD-10-CM

## 2021-10-24 DIAGNOSIS — Z01818 Encounter for other preprocedural examination: Secondary | ICD-10-CM | POA: Diagnosis not present

## 2021-10-24 DIAGNOSIS — K5190 Ulcerative colitis, unspecified, without complications: Secondary | ICD-10-CM | POA: Diagnosis not present

## 2021-10-24 DIAGNOSIS — R609 Edema, unspecified: Secondary | ICD-10-CM | POA: Diagnosis not present

## 2021-10-24 DIAGNOSIS — R935 Abnormal findings on diagnostic imaging of other abdominal regions, including retroperitoneum: Secondary | ICD-10-CM | POA: Diagnosis not present

## 2021-11-03 ENCOUNTER — Encounter: Admit: 2021-11-03 | Payer: MEDICARE | Attending: Student in an Organized Health Care Education/Training Program

## 2021-11-03 ENCOUNTER — Encounter: Admit: 2021-11-03 | Payer: MEDICARE

## 2021-11-03 ENCOUNTER — Ambulatory Visit: Admit: 2021-11-03 | Discharge: 2021-11-16 | Payer: MEDICARE

## 2021-11-03 ENCOUNTER — Ambulatory Visit: Admit: 2021-11-03 | Discharge: 2021-11-16 | Disposition: A | Discharge status: Home / Self Care | Payer: MEDICARE

## 2021-11-03 DIAGNOSIS — D62 Acute posthemorrhagic anemia: Secondary | ICD-10-CM | POA: Diagnosis not present

## 2021-11-03 DIAGNOSIS — K604 Rectal fistula: Secondary | ICD-10-CM | POA: Diagnosis not present

## 2021-11-03 DIAGNOSIS — K9189 Other postprocedural complications and disorders of digestive system: Secondary | ICD-10-CM | POA: Diagnosis not present

## 2021-11-03 DIAGNOSIS — R933 Abnormal findings on diagnostic imaging of other parts of digestive tract: Secondary | ICD-10-CM | POA: Diagnosis not present

## 2021-11-03 DIAGNOSIS — K589 Irritable bowel syndrome without diarrhea: Secondary | ICD-10-CM | POA: Diagnosis not present

## 2021-11-03 DIAGNOSIS — K632 Fistula of intestine: Secondary | ICD-10-CM | POA: Diagnosis not present

## 2021-11-03 DIAGNOSIS — K51919 Ulcerative colitis, unspecified with unspecified complications: Secondary | ICD-10-CM | POA: Diagnosis not present

## 2021-11-03 DIAGNOSIS — E785 Hyperlipidemia, unspecified: Secondary | ICD-10-CM | POA: Diagnosis not present

## 2021-11-03 DIAGNOSIS — K51013 Ulcerative (chronic) pancolitis with fistula: Secondary | ICD-10-CM | POA: Diagnosis not present

## 2021-11-03 DIAGNOSIS — I10 Essential (primary) hypertension: Secondary | ICD-10-CM | POA: Diagnosis not present

## 2021-11-03 DIAGNOSIS — K567 Ileus, unspecified: Secondary | ICD-10-CM | POA: Diagnosis not present

## 2021-11-03 DIAGNOSIS — K648 Other hemorrhoids: Secondary | ICD-10-CM | POA: Diagnosis not present

## 2021-11-03 DIAGNOSIS — G8918 Other acute postprocedural pain: Secondary | ICD-10-CM | POA: Diagnosis not present

## 2021-11-03 DIAGNOSIS — K523 Indeterminate colitis: Secondary | ICD-10-CM | POA: Diagnosis not present

## 2021-11-03 DIAGNOSIS — K644 Residual hemorrhoidal skin tags: Secondary | ICD-10-CM | POA: Diagnosis not present

## 2021-11-03 DIAGNOSIS — E871 Hypo-osmolality and hyponatremia: Secondary | ICD-10-CM | POA: Diagnosis not present

## 2021-11-03 DIAGNOSIS — Z9222 Personal history of monoclonal drug therapy: Secondary | ICD-10-CM | POA: Diagnosis not present

## 2021-11-03 DIAGNOSIS — Z7984 Long term (current) use of oral hypoglycemic drugs: Secondary | ICD-10-CM | POA: Diagnosis not present

## 2021-11-03 DIAGNOSIS — K51019 Ulcerative (chronic) pancolitis with unspecified complications: Secondary | ICD-10-CM | POA: Diagnosis not present

## 2021-11-03 DIAGNOSIS — R609 Edema, unspecified: Secondary | ICD-10-CM | POA: Diagnosis not present

## 2021-11-03 DIAGNOSIS — L409 Psoriasis, unspecified: Secondary | ICD-10-CM | POA: Diagnosis not present

## 2021-11-03 DIAGNOSIS — K5100 Ulcerative (chronic) pancolitis without complications: Secondary | ICD-10-CM | POA: Diagnosis not present

## 2021-11-03 DIAGNOSIS — Z538 Procedure and treatment not carried out for other reasons: Secondary | ICD-10-CM | POA: Diagnosis not present

## 2021-11-03 DIAGNOSIS — D3A8 Other benign neuroendocrine tumors: Secondary | ICD-10-CM | POA: Diagnosis not present

## 2021-11-03 DIAGNOSIS — E119 Type 2 diabetes mellitus without complications: Secondary | ICD-10-CM | POA: Diagnosis not present

## 2021-11-03 DIAGNOSIS — D72829 Elevated white blood cell count, unspecified: Secondary | ICD-10-CM | POA: Diagnosis not present

## 2021-11-12 MED ORDER — ACETAMINOPHEN 325 MG TABLET
ORAL_TABLET | Freq: Four times a day (QID) | ORAL | 0 refills | 5 days | PRN
Start: 2021-11-12 — End: 2021-11-17

## 2021-11-12 MED ORDER — OXYCODONE 5 MG TABLET
ORAL_TABLET | ORAL | 0 refills | 2 days | PRN
Start: 2021-11-12 — End: 2021-11-17

## 2021-11-12 MED ORDER — ONDANSETRON 4 MG DISINTEGRATING TABLET
ORAL_TABLET | Freq: Four times a day (QID) | ORAL | 0 refills | 3 days | PRN
Start: 2021-11-12 — End: 2021-11-17

## 2021-11-13 MED ORDER — ENOXAPARIN 40 MG/0.4 ML SUBCUTANEOUS SYRINGE
Freq: Every day | SUBCUTANEOUS | 0 refills | 30 days
Start: 2021-11-13 — End: 2021-12-13

## 2021-11-14 MED ORDER — OXYCODONE 5 MG TABLET
ORAL_TABLET | ORAL | 0 refills | 2 days | PRN
Start: 2021-11-14 — End: 2021-11-19

## 2021-11-14 MED ORDER — CYCLOBENZAPRINE 5 MG TABLET
ORAL_TABLET | Freq: Three times a day (TID) | ORAL | 0 refills | 5 days | PRN
Start: 2021-11-14 — End: ?

## 2021-11-14 MED ORDER — ONDANSETRON 4 MG DISINTEGRATING TABLET
ORAL_TABLET | Freq: Four times a day (QID) | ORAL | 0 refills | 3 days | PRN
Start: 2021-11-14 — End: 2021-11-19

## 2021-11-14 MED ORDER — ENOXAPARIN 40 MG/0.4 ML SUBCUTANEOUS SYRINGE
Freq: Every day | SUBCUTANEOUS | 0 refills | 20 days
Start: 2021-11-14 — End: 2021-12-04

## 2021-11-16 MED ORDER — ENOXAPARIN 40 MG/0.4 ML SUBCUTANEOUS SYRINGE
Freq: Every day | SUBCUTANEOUS | 0 refills | 20 days | Status: CP
Start: 2021-11-16 — End: 2021-12-06
  Filled 2021-11-16: qty 8, 20d supply, fill #0

## 2021-11-16 MED ORDER — CYCLOBENZAPRINE 5 MG TABLET
ORAL_TABLET | Freq: Three times a day (TID) | ORAL | 0 refills | 5 days | Status: CP | PRN
Start: 2021-11-16 — End: ?
  Filled 2021-11-16: qty 15, 5d supply, fill #0

## 2021-11-16 MED ORDER — ONDANSETRON 4 MG DISINTEGRATING TABLET
ORAL_TABLET | Freq: Four times a day (QID) | ORAL | 0 refills | 3 days | Status: CP | PRN
Start: 2021-11-16 — End: 2021-11-21
  Filled 2021-11-16: qty 10, 3d supply, fill #0

## 2021-11-16 MED ORDER — OXYCODONE 5 MG TABLET
ORAL_TABLET | ORAL | 0 refills | 2 days | Status: CP | PRN
Start: 2021-11-16 — End: 2021-11-21
  Filled 2021-11-16: qty 10, 2d supply, fill #0

## 2021-11-20 ENCOUNTER — Institutional Professional Consult (permissible substitution): Admit: 2021-11-20 | Discharge: 2021-11-20 | Payer: MEDICARE

## 2021-11-20 ENCOUNTER — Ambulatory Visit: Admit: 2021-11-20 | Discharge: 2021-11-20 | Payer: MEDICARE

## 2021-11-20 DIAGNOSIS — K51919 Ulcerative colitis, unspecified with unspecified complications: Secondary | ICD-10-CM | POA: Diagnosis not present

## 2021-11-20 DIAGNOSIS — Z885 Allergy status to narcotic agent status: Secondary | ICD-10-CM | POA: Diagnosis not present

## 2021-11-20 DIAGNOSIS — Z7902 Long term (current) use of antithrombotics/antiplatelets: Secondary | ICD-10-CM | POA: Diagnosis not present

## 2021-11-20 DIAGNOSIS — Z888 Allergy status to other drugs, medicaments and biological substances status: Secondary | ICD-10-CM | POA: Diagnosis not present

## 2021-11-20 DIAGNOSIS — Z79899 Other long term (current) drug therapy: Secondary | ICD-10-CM | POA: Diagnosis not present

## 2021-11-20 DIAGNOSIS — I10 Essential (primary) hypertension: Secondary | ICD-10-CM | POA: Diagnosis not present

## 2021-11-20 DIAGNOSIS — Z7984 Long term (current) use of oral hypoglycemic drugs: Secondary | ICD-10-CM | POA: Diagnosis not present

## 2021-11-20 DIAGNOSIS — Z932 Ileostomy status: Secondary | ICD-10-CM | POA: Diagnosis not present

## 2021-11-20 DIAGNOSIS — E119 Type 2 diabetes mellitus without complications: Secondary | ICD-10-CM | POA: Diagnosis not present

## 2021-11-20 MED ORDER — OXYCODONE 5 MG TABLET
ORAL_TABLET | Freq: Every day | ORAL | 0 refills | 5 days | Status: CP | PRN
Start: 2021-11-20 — End: 2021-11-25

## 2021-12-16 DIAGNOSIS — H353131 Nonexudative age-related macular degeneration, bilateral, early dry stage: Secondary | ICD-10-CM | POA: Diagnosis not present

## 2021-12-16 DIAGNOSIS — H25813 Combined forms of age-related cataract, bilateral: Secondary | ICD-10-CM | POA: Diagnosis not present

## 2021-12-16 DIAGNOSIS — E119 Type 2 diabetes mellitus without complications: Secondary | ICD-10-CM | POA: Diagnosis not present

## 2022-04-17 MED ORDER — HYDROCORTISONE ACETATE 25 MG RECTAL SUPPOSITORY
Freq: Every evening | RECTAL | 6 refills | 24 days | Status: CP
Start: 2022-04-17 — End: 2023-04-17

## 2022-04-23 ENCOUNTER — Ambulatory Visit: Admit: 2022-04-23 | Discharge: 2022-04-23 | Payer: MEDICARE

## 2022-04-23 ENCOUNTER — Institutional Professional Consult (permissible substitution): Admit: 2022-04-23 | Discharge: 2022-04-23 | Payer: MEDICARE

## 2022-04-27 ENCOUNTER — Emergency Department: Payer: Medicare Other

## 2022-04-27 ENCOUNTER — Inpatient Hospital Stay
Admission: EM | Admit: 2022-04-27 | Discharge: 2022-04-30 | DRG: 389 | Disposition: A | Discharge status: Home / Self Care | Payer: Medicare Other | Attending: Internal Medicine | Admitting: Internal Medicine

## 2022-04-27 DIAGNOSIS — K51019 Ulcerative (chronic) pancolitis with unspecified complications: Principal | ICD-10-CM

## 2022-04-27 DIAGNOSIS — K5190 Ulcerative colitis, unspecified, without complications: Secondary | ICD-10-CM | POA: Diagnosis present

## 2022-04-27 DIAGNOSIS — K529 Noninfective gastroenteritis and colitis, unspecified: Secondary | ICD-10-CM | POA: Diagnosis present

## 2022-04-27 DIAGNOSIS — Z932 Ileostomy status: Secondary | ICD-10-CM

## 2022-04-27 DIAGNOSIS — R011 Cardiac murmur, unspecified: Secondary | ICD-10-CM | POA: Diagnosis present

## 2022-04-27 DIAGNOSIS — E871 Hypo-osmolality and hyponatremia: Secondary | ICD-10-CM | POA: Diagnosis present

## 2022-04-27 DIAGNOSIS — Z888 Allergy status to other drugs, medicaments and biological substances status: Secondary | ICD-10-CM | POA: Diagnosis not present

## 2022-04-27 DIAGNOSIS — Z85820 Personal history of malignant melanoma of skin: Secondary | ICD-10-CM

## 2022-04-27 DIAGNOSIS — Z8719 Personal history of other diseases of the digestive system: Secondary | ICD-10-CM | POA: Diagnosis not present

## 2022-04-27 DIAGNOSIS — I10 Essential (primary) hypertension: Secondary | ICD-10-CM | POA: Diagnosis present

## 2022-04-27 DIAGNOSIS — E119 Type 2 diabetes mellitus without complications: Secondary | ICD-10-CM

## 2022-04-27 DIAGNOSIS — E785 Hyperlipidemia, unspecified: Secondary | ICD-10-CM | POA: Insufficient documentation

## 2022-04-27 DIAGNOSIS — Z87891 Personal history of nicotine dependence: Secondary | ICD-10-CM | POA: Diagnosis not present

## 2022-04-27 DIAGNOSIS — K9189 Other postprocedural complications and disorders of digestive system: Secondary | ICD-10-CM

## 2022-04-27 DIAGNOSIS — Z79899 Other long term (current) drug therapy: Secondary | ICD-10-CM

## 2022-04-27 DIAGNOSIS — R7989 Other specified abnormal findings of blood chemistry: Secondary | ICD-10-CM | POA: Diagnosis present

## 2022-04-27 DIAGNOSIS — Z9049 Acquired absence of other specified parts of digestive tract: Secondary | ICD-10-CM

## 2022-04-27 DIAGNOSIS — Z833 Family history of diabetes mellitus: Secondary | ICD-10-CM

## 2022-04-27 DIAGNOSIS — Z820 Family history of epilepsy and other diseases of the nervous system: Secondary | ICD-10-CM

## 2022-04-27 DIAGNOSIS — F32A Depression, unspecified: Secondary | ICD-10-CM | POA: Diagnosis not present

## 2022-04-27 DIAGNOSIS — K56609 Unspecified intestinal obstruction, unspecified as to partial versus complete obstruction: Secondary | ICD-10-CM | POA: Diagnosis not present

## 2022-04-27 DIAGNOSIS — K567 Ileus, unspecified: Secondary | ICD-10-CM | POA: Diagnosis present

## 2022-04-27 DIAGNOSIS — N179 Acute kidney failure, unspecified: Secondary | ICD-10-CM | POA: Diagnosis present

## 2022-04-27 DIAGNOSIS — Z823 Family history of stroke: Secondary | ICD-10-CM

## 2022-04-27 DIAGNOSIS — Z7984 Long term (current) use of oral hypoglycemic drugs: Secondary | ICD-10-CM | POA: Diagnosis not present

## 2022-04-27 DIAGNOSIS — L4050 Arthropathic psoriasis, unspecified: Secondary | ICD-10-CM | POA: Diagnosis present

## 2022-04-27 DIAGNOSIS — Z885 Allergy status to narcotic agent status: Secondary | ICD-10-CM | POA: Diagnosis not present

## 2022-04-27 LAB — COMPREHENSIVE METABOLIC PANEL
ALT: 25 U/L (ref 0–44)
AST: 25 U/L (ref 15–41)
Albumin: 4.4 g/dL (ref 3.5–5.0)
Alkaline Phosphatase: 85 U/L (ref 38–126)
Anion gap: 12 (ref 5–15)
BUN: 17 mg/dL (ref 8–23)
CO2: 19 mmol/L — ABNORMAL LOW (ref 22–32)
Calcium: 9.7 mg/dL (ref 8.9–10.3)
Chloride: 103 mmol/L (ref 98–111)
Creatinine, Ser: 0.81 mg/dL (ref 0.61–1.24)
GFR, Estimated: >60 mL/min (ref >60)
Glucose, Bld: 189 mg/dL — ABNORMAL HIGH (ref 70–99)
Potassium: 4.6 mmol/L (ref 3.5–5.1)
Sodium: 134 mmol/L — ABNORMAL LOW (ref 135–145)
Total Bilirubin: 0.8 mg/dL (ref 0.3–1.2)
Total Protein: 9.5 g/dL — ABNORMAL HIGH (ref 6.5–8.1)

## 2022-04-27 LAB — CBC
HCT: 50.3 % (ref 39.0–52.0)
Hemoglobin: 15.3 g/dL (ref 13.0–17.0)
MCH: 24.4 pg — ABNORMAL LOW (ref 26.0–34.0)
MCHC: 30.4 g/dL (ref 30.0–36.0)
MCV: 80.1 fL (ref 80.0–100.0)
Platelets: 400 10*3/uL (ref 150–400)
RBC: 6.28 MIL/uL — ABNORMAL HIGH (ref 4.22–5.81)
RDW: 18 % — ABNORMAL HIGH (ref 11.5–15.5)
WBC: 18.6 10*3/uL — ABNORMAL HIGH (ref 4.0–10.5)
nRBC: 0 % (ref 0.0–0.2)

## 2022-04-27 LAB — LIPASE, BLOOD: Lipase: 48 U/L (ref 11–51)

## 2022-04-27 MED ORDER — SODIUM CHLORIDE 0.9 % IV SOLN: INTRAVENOUS | Status: DC

## 2022-04-27 MED ORDER — ONDANSETRON HCL 4 MG PO TABS: 4.0000 mg | ORAL_TABLET | Freq: Four times a day (QID) | ORAL | Status: DC | PRN

## 2022-04-27 MED ORDER — CLONAZEPAM 0.5 MG PO TABS
0.5000 mg | ORAL_TABLET | Freq: Three times a day (TID) | ORAL | Status: DC | PRN
Start: 2022-04-27 — End: 2022-04-30

## 2022-04-27 MED ORDER — ONDANSETRON 4 MG PO TBDP: 4.0000 mg | ORAL_TABLET | Freq: Once | ORAL | Status: DC | PRN

## 2022-04-27 MED ORDER — TRAZODONE HCL 50 MG PO TABS
50.0000 mg | ORAL_TABLET | Freq: Every evening | ORAL | Status: DC | PRN
  Administered 2022-04-28 – 2022-04-29 (×2): 50 mg via ORAL
  Filled 2022-04-27 (×2): qty 1

## 2022-04-27 MED ORDER — ATORVASTATIN CALCIUM 20 MG PO TABS
10.0000 mg | ORAL_TABLET | Freq: Every day | ORAL | Status: DC
  Administered 2022-04-28 – 2022-04-29 (×2): 10 mg via ORAL
  Filled 2022-04-27 (×2): qty 1

## 2022-04-27 MED ORDER — ACETAMINOPHEN 325 MG PO TABS
650.0000 mg | ORAL_TABLET | Freq: Four times a day (QID) | ORAL | Status: DC | PRN
  Administered 2022-04-28: 650 mg via ORAL
  Filled 2022-04-27: qty 2

## 2022-04-27 MED ORDER — METOPROLOL TARTRATE 25 MG PO TABS
12.5000 mg | ORAL_TABLET | Freq: Two times a day (BID) | ORAL | Status: DC
  Administered 2022-04-28 (×2): 12.5 mg via ORAL
  Filled 2022-04-27 (×2): qty 1

## 2022-04-27 MED ORDER — NITROGLYCERIN 0.4 MG SL SUBL: 0.4000 mg | SUBLINGUAL_TABLET | SUBLINGUAL | Status: DC | PRN

## 2022-04-27 MED ORDER — LISINOPRIL 20 MG PO TABS
40.0000 mg | ORAL_TABLET | Freq: Every day | ORAL | Status: DC
  Administered 2022-04-28: 40 mg via ORAL
  Filled 2022-04-27: qty 4

## 2022-04-27 MED ORDER — ESCITALOPRAM OXALATE 10 MG PO TABS
5.0000 mg | ORAL_TABLET | Freq: Every day | ORAL | Status: DC
  Administered 2022-04-28 – 2022-04-30 (×4): 5 mg via ORAL
  Filled 2022-04-27 (×4): qty 1

## 2022-04-27 MED ORDER — ENOXAPARIN SODIUM 40 MG/0.4ML IJ SOSY
40.0000 mg | PREFILLED_SYRINGE | INTRAMUSCULAR | Status: DC
  Administered 2022-04-28: 40 mg via SUBCUTANEOUS
  Filled 2022-04-27: qty 0.4

## 2022-04-27 MED ORDER — ACETAMINOPHEN 650 MG RE SUPP: 650.0000 mg | Freq: Four times a day (QID) | RECTAL | Status: DC | PRN

## 2022-04-27 MED ORDER — IOHEXOL 300 MG/ML  SOLN
100.0000 mL | Freq: Once | INTRAMUSCULAR | Status: AC | PRN
  Administered 2022-04-27: 100 mL via INTRAVENOUS

## 2022-04-27 MED ORDER — MAGNESIUM HYDROXIDE 400 MG/5ML PO SUSP: 30.0000 mL | Freq: Every day | ORAL | Status: DC | PRN

## 2022-04-27 MED ORDER — ONDANSETRON HCL 4 MG/2ML IJ SOLN: 4.0000 mg | Freq: Four times a day (QID) | INTRAMUSCULAR | Status: DC | PRN

## 2022-04-27 NOTE — Consult Note (Signed)
I have reviewed the CT reporting the SBO at the ileostomy.   A bit uncertain of this conclusion based on my review of the images.  Never the less, there are fluid filled and dilated loops, and a fluid filled stomach.  There seems to be patency of the ileostomy on the CT.   Perhaps more of an ileus than SBO.  May benefit from NGT to [facility], full consult to follow.

## 2022-04-27 NOTE — ED Provider Notes (Signed)
[facility] Provider Note    Event Date/Time   First MD Initiated Contact with Patient 04/27/22 2012     (approximate)   History   Abdominal Pain   HPI  Benjamin Chapman is a 71 y.o. male   This is a patient with Crohn's disease who had a abdominal surgery end ileostomy  done in the summertime 2023 from outside hospital [facility] presents with abdominal distention nausea vomiting 1 time this morning and no output from colostomy bag in the last 24 hours.  He denies any other acute medical complaints.   History was obtained via the patient.  I reviewed external medical notes including general surgery office visit dated 04/23/2022 from [facility] detailing his surgeries performed in the summertime.      Physical Exam   Triage Vital Signs: ED Triage Vitals  Enc Vitals Group     BP 04/27/22 1314 (!) 160/75     Pulse Rate 04/27/22 1314 73     Resp 04/27/22 1314 18     Temp 04/27/22 1314 97.8 F (36.6 C)     Temp Source 04/27/22 1314 Oral     SpO2 04/27/22 1314 96 %     Weight 04/27/22 1315 190 lb (86.2 kg)     Height 04/27/22 1315 '5\' 7"'$  (1.702 m)     Head Circumference --      Peak Flow --      Pain Score 04/27/22 1315 0     Pain Loc --      Pain Edu? --      Excl. in [place]? --     Most recent vital signs: Vitals:   04/27/22 2033 04/27/22 2256  BP: (!) 151/77   Pulse: (!) 105   Resp: 20   Temp: 99 F (37.2 C)   SpO2: 95% 95%    General: Awake, no distress.  CV:  Good peripheral perfusion.  Resp:  Normal effort.  Abd:  Mild distention, mild tenderness to palpation without rigitidy or guarding Other:  Awake alert oriented and pleasant.  Afebrile.  Nontoxic-appearing.  No active vomiting.  Scant output from ostomy bag.   ED Results / Procedures / Treatments   Labs (all labs ordered are listed, but only abnormal results are displayed) Labs Reviewed  COMPREHENSIVE METABOLIC PANEL - Abnormal; Notable for the following components:      Result Value    Sodium 134 (*)    CO2 19 (*)    Glucose, Bld 189 (*)    Total Protein 9.5 (*)    All other components within normal limits  CBC - Abnormal; Notable for the following components:   WBC 18.6 (*)    RBC 6.28 (*)    MCH 24.4 (*)    RDW 18.0 (*)    All other components within normal limits  LIPASE, BLOOD  URINALYSIS, ROUTINE W REFLEX MICROSCOPIC  BASIC METABOLIC PANEL  CBC     I reviewed labs and they are notable for white blood cell count is 18.6    RADIOLOGY I independently reviewed and interpreted CT of the abdomen pelvis see distended loops of bowel concerning for obstructive pattern   PROCEDURES:  Critical Care performed: No  Procedures   MEDICATIONS ORDERED IN ED: Medications  ondansetron (ZOFRAN-ODT) disintegrating tablet 4 mg (has no administration in time range)  atorvastatin (LIPITOR) tablet 10 mg (has no administration in time range)  lisinopril (ZESTRIL) tablet 40 mg (has no administration in time range)  metoprolol tartrate (LOPRESSOR) tablet  12.5 mg (has no administration in time range)  nitroGLYCERIN (NITROSTAT) SL tablet 0.4 mg (has no administration in time range)  escitalopram (LEXAPRO) tablet 5 mg (has no administration in time range)  traZODone (DESYREL) tablet 50-100 mg (has no administration in time range)  clonazePAM (KLONOPIN) tablet 0.5 mg (has no administration in time range)  enoxaparin (LOVENOX) injection 40 mg (has no administration in time range)  0.9 %  sodium chloride infusion (has no administration in time range)  acetaminophen (TYLENOL) tablet 650 mg (has no administration in time range)    Or  acetaminophen (TYLENOL) suppository 650 mg (has no administration in time range)  magnesium hydroxide (MILK OF MAGNESIA) suspension 30 mL (has no administration in time range)  ondansetron (ZOFRAN) tablet 4 mg (has no administration in time range)    Or  ondansetron (ZOFRAN) injection 4 mg (has no administration in time range)  iohexol  (OMNIPAQUE) 300 MG/ML solution 100 mL (100 mLs Intravenous Contrast Given 04/27/22 1829)    Consultants:  I spoke with Dr. Christian Mate of general surgery regarding care plan for this patient.   IMPRESSION / MDM / ASSESSMENT AND PLAN / ED COURSE  I reviewed the triage vital signs and the nursing notes.                              Differential diagnosis includes, but is not limited to, obstruction, infection, metabolic derangements, dehydration or AKI    MDM: With small bowel obstruction identified on CT scan and symptoms consistent with this diagnosis.  No active vomiting.  Otherwise appears well.  Consulted with Dr. Christian Mate of general surgery who advised no NG tube, medical management, admission to hospitalist service.  No evidence of infection defer antibiotics at this time.  Patient's presentation is most consistent with acute presentation with potential threat to life or bodily function.       FINAL CLINICAL IMPRESSION(S) / ED DIAGNOSES   Final diagnoses:  Small bowel obstruction ([place])     Rx / DC Orders   ED Discharge Orders     None        Note:  This document was prepared using Dragon voice recognition software and may include unintentional dictation errors.    Lucillie Garfinkel, MD 04/27/22 [phone]

## 2022-04-27 NOTE — ED Notes (Signed)
Bladder scan 174m

## 2022-04-27 NOTE — H&P (Signed)
[place]   PATIENT NAME: Benjamin Chapman    MR#:  [id number]  DATE OF BIRTH:  December 11, 1950  DATE OF ADMISSION:  04/27/2022  PRIMARY CARE PHYSICIAN: System, Provider Not In   Patient is coming from: Home  REQUESTING/REFERRING PHYSICIAN: Lucillie Garfinkel, MD  CHIEF COMPLAINT:   Chief Complaint  Patient presents with   Abdominal Pain    HISTORY OF PRESENT ILLNESS:  Benjamin Chapman is a 71 y.o. Caucasian male with medical history significant for essential hypertension, type 2 diabetes mellitus, psoriatic arthritis, who presented to the emergency room with acute onset of abdominal distention and lower abdominal pain over the last couple of days with associated nausea and vomiting.  He has not had any output from his right ileostomy.  No dysuria, oliguria or hematuria or flank pain.  No chest pain or dyspnea or cough or wheezing.  No fever or chills.  ED Course: When he Came to the ER, BP was 160/75 with otherwise normal vital signs.  Labs revealed mild open uremia and a blood glucose of 189 with CO2 of 19 and otherwise unremarkable CMP.  CBC shows significant Oxydose of 18.6 and UA showed more than 1046 specific gravity and 30 protein with 0-5 WBCs.  Imaging: Abdominal pelvic CT scan revealed small bowel obstruction with transition point at the level of the right lower quadrant ileostomy with mesenteric edema, small amount of free fluid and interloop fluid, cholelithiasis and aortic atherosclerosis.  Dr. Christian Mate was notified about the patient and at this time did not recommend NG tube placement.  The patient had significant output in the ER from his ileostomy.  He was still having abdominal distention.  He will be admitted to a medical bed for further evaluation and management. PAST MEDICAL HISTORY:   Past Medical History:  Diagnosis Date   Bilateral carotid bruits    Diabetes mellitus without complication (HCC)    Dyspnea    Hypertension    Psoriatic arthritis (HCC)     Systolic murmur of aorta    Ulcerative colitis, acute ([place])     PAST SURGICAL HISTORY:   Past Surgical History:  Procedure Laterality Date   COLONOSCOPY WITH PROPOFOL N/A 01/09/2019   Procedure: COLONOSCOPY WITH PROPOFOL;  Surgeon: Lollie Sails, MD;  Location: [facility] ENDOSCOPY;  Service: Endoscopy;  Laterality: N/A;   KNEE ARTHROSCOPY Right    MELANOMA EXCISION N/A 1992   back   SHOULDER ARTHROSCOPY     bi-cept    SOCIAL HISTORY:   Social History   Tobacco Use   Smoking status: Former    Packs/day: 1.00    Years: 20.00    Total pack years: 20.00    Types: Cigarettes    Quit date: 07/15/1998    Years since quitting: 23.8   Smokeless tobacco: Never  Substance Use Topics   Alcohol use: Yes    Comment: occasional    FAMILY HISTORY:   Family History  Problem Relation Age of Onset   Alzheimer's disease Mother    Stroke Mother    Diabetes Mother     DRUG ALLERGIES:   Allergies  Allergen Reactions   Codeine Other (See Comments)    Nausea    Infliximab Other (See Comments)   Other Other (See Comments)    REVIEW OF SYSTEMS:   ROS As per history of present illness. All pertinent systems were reviewed above. Constitutional, HEENT, cardiovascular, respiratory, GI, GU, musculoskeletal, neuro, psychiatric, endocrine, integumentary and hematologic systems were reviewed and  are otherwise negative/unremarkable except for positive findings mentioned above in the HPI.   MEDICATIONS AT HOME:   Prior to Admission medications   Medication Sig Start Date End Date Taking? Authorizing Provider  escitalopram (LEXAPRO) 5 MG tablet Take 5 mg by mouth daily.   Yes [provider]  metFORMIN (GLUCOPHAGE) 500 MG tablet Take 1,000 mg by mouth daily with breakfast.   Yes [provider]  traZODone (DESYREL) 50 MG tablet Take 50-100 mg by mouth at bedtime as needed for sleep.   Yes [provider]  nitroGLYCERIN (NITROSTAT) 0.4 MG SL tablet DISSOLVE 1  TABLET UNDER THE TONGUE EVERY 5 MINUTES AS  NEEDED FOR CHEST PAIN. MAX  OF 3 TABLETS IN 15 MINUTES. CALL 911 IF PAIN PERSISTS. 02/01/20   Adrian Prows, MD      VITAL SIGNS:  Blood pressure (!) 141/81, pulse 95, temperature 98.6 F (37 C), temperature source Oral, resp. rate 15, height '5\' 7"'$  (1.702 m), weight 86.2 kg, SpO2 95 %.  PHYSICAL EXAMINATION:  Physical Exam  GENERAL:  71 y.o.-year-old Caucasian male patient lying in the bed with no acute distress.  EYES: Pupils equal, round, reactive to light and accommodation. No scleral icterus. Extraocular muscles intact.  HEENT: Head atraumatic, normocephalic. Oropharynx and nasopharynx clear.  NECK:  Supple, no jugular venous distention. No thyroid enlargement, no tenderness.  LUNGS: Normal breath sounds bilaterally, no wheezing, rales,rhonchi or crepitation. No use of accessory muscles of respiration.  CARDIOVASCULAR: Regular rate and rhythm, S1, S2 normal. No murmurs, rubs, or gallops.  ABDOMEN: Soft, nondistended, with mild lower abdominal tenderness especially in the right lower quadrant.  Ileostomy was intact with brown stool..  Bowel sounds significantly diminished.  No organomegaly or mass.  EXTREMITIES: No pedal edema, cyanosis, or clubbing.  NEUROLOGIC: Cranial nerves II through XII are intact. Muscle strength 5/5 in all extremities. Sensation intact. Gait not checked.  PSYCHIATRIC: The patient is alert and oriented x 3.  Normal affect and good eye contact. SKIN: No obvious rash, lesion, or ulcer.   LABORATORY PANEL:   CBC Recent Labs  Lab 04/27/22 1330  WBC 18.6*  HGB 15.3  HCT 50.3  PLT 400   ------------------------------------------------------------------------------------------------------------------  Chemistries  Recent Labs  Lab 04/27/22 1330  NA 134*  K 4.6  CL 103  CO2 19*  GLUCOSE 189*  BUN 17  CREATININE 0.81  CALCIUM 9.7  AST 25  ALT 25  ALKPHOS 85  BILITOT 0.8    ------------------------------------------------------------------------------------------------------------------  Cardiac Enzymes No results for input(s): "TROPONINI" in the last 168 hours. ------------------------------------------------------------------------------------------------------------------  RADIOLOGY:  CT Abdomen Pelvis W Contrast  Result Date: 04/27/2022 CLINICAL DATA:  Cramping, decreased output of ileostomy. EXAM: CT ABDOMEN AND PELVIS WITH CONTRAST TECHNIQUE: Multidetector CT imaging of the abdomen and pelvis was performed using the standard protocol following bolus administration of intravenous contrast. RADIATION DOSE REDUCTION: This exam was performed according to the departmental dose-optimization program which includes automated exposure control, adjustment of the mA and/or kV according to patient size and/or use of iterative reconstruction technique. CONTRAST:  13m OMNIPAQUE IOHEXOL 300 MG/ML  SOLN COMPARISON:  CT abdomen and pelvis 06/22/2016 FINDINGS: Lower chest: There is atelectasis in the lung bases. Hepatobiliary: Gallstones are present. There is no biliary ductal dilatation. The liver is within normal limits. Pancreas: Unremarkable. No pancreatic ductal dilatation or surrounding inflammatory changes. Spleen: Normal in size without focal abnormality. Adrenals/Urinary Tract: Adrenal glands are unremarkable. Kidneys are normal, without renal calculi, focal lesion, or hydronephrosis. Bladder is unremarkable.  Stomach/Bowel: Patient is status post colectomy with right lower quadrant ileostomy. There are dilated fluid-filled small bowel loops to the level of the ostomy. The stomach is also dilated with air-fluid levels. Small bowel loops measure up to 4.1 cm. There is mild mesenteric edema. There is no pneumatosis or wall thickening. There is no free air. Vascular/Lymphatic: Aortic atherosclerosis. No enlarged abdominal or pelvic lymph nodes. Reproductive: Prostate is  unremarkable. Other: There is a small amount of interloop fluid. There is a small amount of free fluid in the right abdomen. There is no free air or focal abdominal wall hernia. Musculoskeletal: Degenerative changes affect the spine. IMPRESSION: 1. Findings compatible with small-bowel obstruction with transition point at the level of the right lower quadrant ileostomy. Mesenteric edema is present. 2. Small amount of free fluid and interloop fluid. 3. Cholelithiasis. Aortic Atherosclerosis (ICD10-I70.0). Electronically Signed   By: Ronney Asters M.D.   On: 04/27/2022 19:05      IMPRESSION AND PLAN:  Assessment and Plan: * SBO (small bowel obstruction) ([place]) - The patient admitted to a medical bed pink to send-we will keep him n.p.o. and hydrate with IV normal saline. - We will follow-up two-view abdomen x-ray in AM. - Pain management will be provided. - General surgery consult will be obtained. - Dr. Christian Mate was notified about the patient.  History of ulcerative colitis - The patient is status post colectomy and end ileostomy.  Depression - We will continue his Lexapro.  Dyslipidemia - Will continue statin therapy.  Controlled type 2 diabetes mellitus without complication, without long-term current use of insulin ([place]) - The patient with placed on supplemental coverage with NovoLog. - We will hold off metformin.   DVT prophylaxis: Lovenox.  Advanced Care Planning:  Code Status: full code.  Family Communication:  The plan of care was discussed in details with the patient (and family). I answered all questions. The patient agreed to proceed with the above mentioned plan. Further management will depend upon hospital course. Disposition Plan: Back to previous home environment Consults called: General surgery All the records are reviewed and case discussed with ED provider.  Status is: Inpatient   At the time of the admission, it appears that the appropriate admission status for  this patient is inpatient.  This is judged to be reasonable and necessary in order to provide the required intensity of service to ensure the patient's safety given the presenting symptoms, physical exam findings and initial radiographic and laboratory data in the context of comorbid conditions.  The patient requires inpatient status due to high intensity of service, high risk of further deterioration and high frequency of surveillance required.  I certify that at the time of admission, it is my clinical judgment that the patient will require inpatient hospital care extending more than 2 midnights.                            Dispo: The patient is from: Home              Anticipated d/c is to: Home              Patient currently is not medically stable to d/c.              Difficult to place patient: No  Christel Mormon M.D on 04/28/2022 at 3:47 AM  [facility]   From 7 PM-7 AM, contact night-coverage [email or web address]  CC: Primary care physician; System, Provider  Not In

## 2022-04-27 NOTE — ED Triage Notes (Signed)
Pt presents to the ED via POV due to abdominal cramping and decrease output in ileostomy. Pt c/o decrease appetite and urine out. Pt noticed little to no drainage starting at 0100AM this morning. Pt also c/o vomiting. Pt states the ileostomy was place July 2023. Pt A&Ox4

## 2022-04-28 ENCOUNTER — Encounter: Payer: Self-pay | Admitting: Family Medicine

## 2022-04-28 ENCOUNTER — Inpatient Hospital Stay: Payer: Medicare Other

## 2022-04-28 ENCOUNTER — Other Ambulatory Visit: Payer: Self-pay

## 2022-04-28 DIAGNOSIS — K567 Ileus, unspecified: Secondary | ICD-10-CM

## 2022-04-28 DIAGNOSIS — K9189 Other postprocedural complications and disorders of digestive system: Secondary | ICD-10-CM | POA: Diagnosis not present

## 2022-04-28 DIAGNOSIS — E785 Hyperlipidemia, unspecified: Secondary | ICD-10-CM | POA: Insufficient documentation

## 2022-04-28 DIAGNOSIS — Z8719 Personal history of other diseases of the digestive system: Secondary | ICD-10-CM

## 2022-04-28 DIAGNOSIS — F32A Depression, unspecified: Secondary | ICD-10-CM | POA: Insufficient documentation

## 2022-04-28 DIAGNOSIS — K56609 Unspecified intestinal obstruction, unspecified as to partial versus complete obstruction: Secondary | ICD-10-CM | POA: Diagnosis not present

## 2022-04-28 DIAGNOSIS — E119 Type 2 diabetes mellitus without complications: Secondary | ICD-10-CM

## 2022-04-28 LAB — URINALYSIS, ROUTINE W REFLEX MICROSCOPIC
Bacteria, UA: NONE SEEN
Bilirubin Urine: NEGATIVE
Glucose, UA: NEGATIVE mg/dL
Ketones, ur: NEGATIVE mg/dL
Leukocytes,Ua: NEGATIVE
Nitrite: NEGATIVE
Protein, ur: 30 mg/dL — AB
Specific Gravity, Urine: >1.046 — ABNORMAL HIGH (ref 1.005–1.030)
Squamous Epithelial / HPF: NONE SEEN (ref 0–5)
pH: 5 (ref 5.0–8.0)

## 2022-04-28 LAB — CBC
HCT: 45.7 % (ref 39.0–52.0)
Hemoglobin: 14.2 g/dL (ref 13.0–17.0)
MCH: 24.6 pg — ABNORMAL LOW (ref 26.0–34.0)
MCHC: 31.1 g/dL (ref 30.0–36.0)
MCV: 79.2 fL — ABNORMAL LOW (ref 80.0–100.0)
Platelets: 344 10*3/uL (ref 150–400)
RBC: 5.77 MIL/uL (ref 4.22–5.81)
RDW: 17.8 % — ABNORMAL HIGH (ref 11.5–15.5)
WBC: 5.3 10*3/uL (ref 4.0–10.5)
nRBC: 0 % (ref 0.0–0.2)

## 2022-04-28 LAB — BASIC METABOLIC PANEL
Anion gap: 6 (ref 5–15)
BUN: 23 mg/dL (ref 8–23)
CO2: 23 mmol/L (ref 22–32)
Calcium: 8.5 mg/dL — ABNORMAL LOW (ref 8.9–10.3)
Chloride: 105 mmol/L (ref 98–111)
Creatinine, Ser: 0.87 mg/dL (ref 0.61–1.24)
GFR, Estimated: >60 mL/min (ref >60)
Glucose, Bld: 165 mg/dL — ABNORMAL HIGH (ref 70–99)
Potassium: 4.2 mmol/L (ref 3.5–5.1)
Sodium: 134 mmol/L — ABNORMAL LOW (ref 135–145)

## 2022-04-28 MED ORDER — KETOROLAC TROMETHAMINE 15 MG/ML IJ SOLN
15.0000 mg | Freq: Four times a day (QID) | INTRAMUSCULAR | Status: DC | PRN
  Administered 2022-04-28: 15 mg via INTRAVENOUS
  Filled 2022-04-28: qty 1

## 2022-04-28 NOTE — Progress Notes (Signed)
PROGRESS NOTE    Benjamin Chapman  BCW:[id number] DOB: 1950/07/15 DOA: 04/27/2022 PCP: System, Provider Not In    Brief Narrative:  Benjamin Chapman is a 71 y.o. Caucasian male with medical history of essential hypertension, type 2 diabetes mellitus, psoriatic arthritis, presented to hospital with acute abdominal distention and pain with nausea and vomiting without any output from his ileostomy.  In the ED, he was mildly hypertensive. Abdominal CT scan showed small bowel obstruction with transition point at the right lower quadrant ileostomy area with mesenteric edema.  General surgery Dr. Christian Mate was notified and patient was admitted hospital for further evaluation and treatment.   Assessment and plan.  SBO (small bowel obstruction) (HCC) Continue IV fluids . analgesics antiemetics and supportive care.  Follow general surgery recommendation.  Continue normal saline at 100 ml/h.  Patient has been started on clears.  History of ulcerative colitis Status post colectomy and end ileostomy.  Continue hydrocortisone suppository.   Depression Continue Lexapro   Dyslipidemia Continue Crestor  Essential hypertension.  Continue lisinopril and metoprolol.  Controlled type 2 diabetes mellitus without complication, without long-term current use of insulin (HCC) Hold metformin.  Continue sliding scale insulin Accu-Cheks.      DVT prophylaxis: enoxaparin (LOVENOX) injection 40 mg Start: 04/27/22 2215   Code Status:     Code Status: Full Code  Disposition: Home likely tomorrow if continues to improve Status is: Inpatient Remains inpatient appropriate because: SBO, IV fluids.    Family Communication: Spoke with the patient at bedside  Consultants:  General surgery  Procedures:  None so far  Antimicrobials:  None  Anti-infectives (From admission, onward)    None      Subjective: Today, patient was seen and examined at bedside.  Patient denies any nausea, vomiting, abdominal  pain, fever or chills.  Has had some ileostomy output.  Has been started on clears.  He did feel some abdominal discomfort after liquid diet.  Objective: Vitals:   04/28/22 0226 04/28/22 0536 04/28/22 0955 04/28/22 1248  BP: (!) 141/81 (!) 151/93 133/68 131/67  Pulse: 95 74 60 (!) 57  Resp: '15 16 14 18  '$ Temp: 98.6 F (37 C) 97.6 F (36.4 C) 98.5 F (36.9 C) 97.6 F (36.4 C)  TempSrc: Oral Oral Oral   SpO2: 95% 95% 94% 98%  Weight:      Height:        Intake/Output Summary (Last 24 hours) at 04/28/2022 1319 Last data filed at 04/28/2022 0155 Gross per 24 hour  Intake 54 ml  Output --  Net 54 ml   Filed Weights   04/27/22 1315  Weight: 86.2 kg    Physical Examination: Body mass index is 29.76 kg/m.  General:  Average built, not in obvious distress HENT:   No scleral pallor or icterus noted. Oral mucosa is moist.  Chest:  Diminished breath sounds bilaterally. No crackles or wheezes.  CVS: S1 &S2 heard. No murmur.  Regular rate and rhythm. Abdomen: Soft, nontender, nondistended.  Bowel sounds are heard.  Ileostomy in place. Extremities: No cyanosis, clubbing or edema.  Peripheral pulses are palpable. Psych: Alert, awake and oriented, normal mood CNS:  No cranial nerve deficits.  Power equal in all extremities.   Skin: Warm and dry.  No rashes noted.  Data Reviewed:   CBC: Recent Labs  Lab 04/27/22 1330 04/28/22 0524  WBC 18.6* 5.3  HGB 15.3 14.2  HCT 50.3 45.7  MCV 80.1 79.2*  PLT 400 038    Basic Metabolic  Panel: Recent Labs  Lab 04/27/22 1330 04/28/22 0524  NA 134* 134*  K 4.6 4.2  CL 103 105  CO2 19* 23  GLUCOSE 189* 165*  BUN 17 23  CREATININE 0.81 0.87  CALCIUM 9.7 8.5*    Liver Function Tests: Recent Labs  Lab 04/27/22 1330  AST 25  ALT 25  ALKPHOS 85  BILITOT 0.8  PROT 9.5*  ALBUMIN 4.4     Radiology Studies: DG Abd 2 Views  Result Date: 04/28/2022 CLINICAL DATA:  SBO EXAM: ABDOMEN - 2 VIEW COMPARISON:  None Available.  FINDINGS: Dilated small bowel loops consistent with obstruction or ileus. Right lower quadrant colostomy. No organomegaly. No radiopaque calculi identified. IMPRESSION: Small-bowel dilatation consistent with obstruction or ileus. Electronically Signed   By: Sammie Bench M.D.   On: 04/28/2022 09:27   CT Abdomen Pelvis W Contrast  Result Date: 04/27/2022 CLINICAL DATA:  Cramping, decreased output of ileostomy. EXAM: CT ABDOMEN AND PELVIS WITH CONTRAST TECHNIQUE: Multidetector CT imaging of the abdomen and pelvis was performed using the standard protocol following bolus administration of intravenous contrast. RADIATION DOSE REDUCTION: This exam was performed according to the departmental dose-optimization program which includes automated exposure control, adjustment of the mA and/or kV according to patient size and/or use of iterative reconstruction technique. CONTRAST:  171m OMNIPAQUE IOHEXOL 300 MG/ML  SOLN COMPARISON:  CT abdomen and pelvis 06/22/2016 FINDINGS: Lower chest: There is atelectasis in the lung bases. Hepatobiliary: Gallstones are present. There is no biliary ductal dilatation. The liver is within normal limits. Pancreas: Unremarkable. No pancreatic ductal dilatation or surrounding inflammatory changes. Spleen: Normal in size without focal abnormality. Adrenals/Urinary Tract: Adrenal glands are unremarkable. Kidneys are normal, without renal calculi, focal lesion, or hydronephrosis. Bladder is unremarkable. Stomach/Bowel: Patient is status post colectomy with right lower quadrant ileostomy. There are dilated fluid-filled small bowel loops to the level of the ostomy. The stomach is also dilated with air-fluid levels. Small bowel loops measure up to 4.1 cm. There is mild mesenteric edema. There is no pneumatosis or wall thickening. There is no free air. Vascular/Lymphatic: Aortic atherosclerosis. No enlarged abdominal or pelvic lymph nodes. Reproductive: Prostate is unremarkable. Other: There is  a small amount of interloop fluid. There is a small amount of free fluid in the right abdomen. There is no free air or focal abdominal wall hernia. Musculoskeletal: Degenerative changes affect the spine. IMPRESSION: 1. Findings compatible with small-bowel obstruction with transition point at the level of the right lower quadrant ileostomy. Mesenteric edema is present. 2. Small amount of free fluid and interloop fluid. 3. Cholelithiasis. Aortic Atherosclerosis (ICD10-I70.0). Electronically Signed   By: ARonney AstersM.D.   On: 04/27/2022 19:05      LOS: 1 day    LFlora Lipps MD [facility] Available via Epic secure chat 7am-7pm After these hours, please refer to coverage provider listed on [email or web address] 04/28/2022, 1:19 PM

## 2022-04-28 NOTE — Assessment & Plan Note (Signed)
-   The patient is status post colectomy and end ileostomy.

## 2022-04-28 NOTE — Hospital Course (Addendum)
Benjamin Chapman is a 71 y.o. Caucasian male with medical history of essential hypertension, type 2 diabetes mellitus, psoriatic arthritis, presented to hospital with acute abdominal distention and pain with nausea and vomiting without any output from his ileostomy.  In the ED, he was mildly hypertensive. Abdominal CT scan showed small bowel obstruction with transition point at the right lower quadrant ileostomy area with mesenteric edema.  General surgery Dr. Christian Mate was notified and patient was admitted hospital for further evaluation and treatment.

## 2022-04-28 NOTE — Assessment & Plan Note (Addendum)
-   The patient admitted to a medical bed pink to send-we will keep him n.p.o. and hydrate with IV normal saline. - We will follow-up two-view abdomen x-ray in AM. - Pain management will be provided. - General surgery consult will be obtained. - Dr. Christian Mate was notified about the patient.

## 2022-04-28 NOTE — Consult Note (Signed)
[facility] SURGICAL CONSULTATION NOTE (initial) - cpt: 96222   HISTORY OF PRESENT ILLNESS (HPI):  71 y.o. male presented to [facility] ED overnight for evaluation of nausea/emesis. At time of presentation, patient reported a 24 hour history of abdominal distension, nausea, emesis, and decreased output from his ileostomy. No fever, chills, cough, CP, SOB, or abdominal pain. Patient has a history of TAC partial proctectomy with end ileostomy for IBD with Dr Gwynneth Macleod at [facility] on 11/05/2021. Seems like he has done reasonably well since then. He was seen in follow up on 04/23/2022 and thought to be doing well. He was referred to Dr Theone Murdoch for evaluation and sigmoidoscopy in February 2025. Seems they are considering takedown in 6 months. Work up in the ED here revealed leukocytosis to 18.6K (now 5.3K) and BMP was reassuring. He did undergo CT Abdomen/Pelvis which was concerning for possible SBO vs ileus. No active emesis and NGT placement was deferred.   Surgery is consulted by emergency medicine physician Dr. Lucillie Garfinkel, MD in this context for evaluation and management of ileus in setting of end ileostomy.  This morning, patient reports he is feeling markedly better. No abdominal pain. No nausea nor emesis. He also reports that he has started to have return of significant output from ileostomy this morning.   PAST MEDICAL HISTORY (PMH):  Past Medical History:  Diagnosis Date   Bilateral carotid bruits    Diabetes mellitus without complication (HCC)    Dyspnea    Hypertension    Psoriatic arthritis (HCC)    Systolic murmur of aorta    Ulcerative colitis, acute ([place])      PAST SURGICAL HISTORY ([place]):  Past Surgical History:  Procedure Laterality Date   COLONOSCOPY WITH PROPOFOL N/A 01/09/2019   Procedure: COLONOSCOPY WITH PROPOFOL;  Surgeon: Lollie Sails, MD;  Location: [facility] ENDOSCOPY;  Service: Endoscopy;  Laterality: N/A;   KNEE ARTHROSCOPY Right    MELANOMA EXCISION N/A 1992    back   SHOULDER ARTHROSCOPY     bi-cept     MEDICATIONS:  Prior to Admission medications   Medication Sig Start Date End Date Taking? Authorizing Provider  acetaminophen (TYLENOL) 500 MG tablet Take 500-1,000 mg by mouth every 6 (six) hours as needed for mild pain or moderate pain.   Yes [provider]  escitalopram (LEXAPRO) 5 MG tablet Take 5 mg by mouth daily.   Yes [provider]  hydrocortisone (ANUSOL-HC) 25 MG suppository Place 25 mg rectally at bedtime.   Yes [provider]  metFORMIN (GLUCOPHAGE) 500 MG tablet Take 1,000 mg by mouth daily with breakfast.   Yes [provider]  nitroGLYCERIN (NITROSTAT) 0.4 MG SL tablet DISSOLVE 1 TABLET UNDER THE TONGUE EVERY 5 MINUTES AS  NEEDED FOR CHEST PAIN. MAX  OF 3 TABLETS IN 15 MINUTES. CALL 911 IF PAIN PERSISTS. 02/01/20  Yes Adrian Prows, MD  rosuvastatin (CRESTOR) 5 MG tablet Take 5 mg by mouth daily.   Yes [provider]  traZODone (DESYREL) 50 MG tablet Take 50-100 mg by mouth at bedtime as needed for sleep.   Yes [provider]     ALLERGIES:  Allergies  Allergen Reactions   Codeine Other (See Comments)    Nausea    Infliximab Other (See Comments)   Other Other (See Comments)     SOCIAL HISTORY:  Social History   Socioeconomic History   Marital status: Married    Spouse name: Not on file   Number of children: 2  Years of education: Not on file   Highest education level: Not on file  Occupational History   Not on file  Tobacco Use   Smoking status: Former    Packs/day: 1.00    Years: 20.00    Total pack years: 20.00    Types: Cigarettes    Quit date: 07/15/1998    Years since quitting: 23.8   Smokeless tobacco: Never  Vaping Use   Vaping Use: Never used  Substance and Sexual Activity   Alcohol use: Yes    Comment: occasional   Drug use: Never   Sexual activity: Never  Other Topics Concern   Not on file  Social History Narrative   Not on file    Social Determinants of Health   Financial Resource Strain: Not on file  Food Insecurity: Not on file  Transportation Needs: Not on file  Physical Activity: Not on file  Stress: Not on file  Social Connections: Not on file  Intimate Partner Violence: Not on file     FAMILY HISTORY:  Family History  Problem Relation Age of Onset   Alzheimer's disease Mother    Stroke Mother    Diabetes Mother       REVIEW OF SYSTEMS:  Review of Systems  Constitutional:  Negative for chills and fever.  HENT:  Negative for congestion and sore throat.   Respiratory:  Negative for cough and shortness of breath.   Cardiovascular:  Negative for chest pain and palpitations.  Gastrointestinal:  Positive for nausea (Now resolved) and vomiting (Now resolved). Negative for abdominal pain, blood in stool, constipation and diarrhea.  Genitourinary:  Negative for dysuria and urgency.  All other systems reviewed and are negative.   VITAL SIGNS:  Temp:  [97.6 F (36.4 C)-99 F (37.2 C)] 97.6 F (36.4 C) (12/19 0536) Pulse Rate:  [73-107] 74 (12/19 0536) Resp:  [15-20] 16 (12/19 0536) BP: (135-160)/(75-93) 151/93 (12/19 0536) SpO2:  [95 %-97 %] 95 % (12/19 0536) Weight:  [86.2 kg] 86.2 kg (12/18 1315)     Height: '5\' 7"'$  (170.2 cm) Weight: 86.2 kg BMI (Calculated): 29.75   INTAKE/OUTPUT:  12/18 0701 - 12/19 0700 In: 54 [I.V.:54] Out: -   PHYSICAL EXAM:  Physical Exam Vitals and nursing note reviewed. Exam conducted with a chaperone present.  Constitutional:      General: He is not in acute distress.    Appearance: He is well-developed and normal weight. He is not ill-appearing.     Comments: Patient resting in bed; NAD  Cardiovascular:     Rate and Rhythm: Normal rate.     Heart sounds: Normal heart sounds. No murmur heard. Pulmonary:     Effort: Pulmonary effort is normal. No respiratory distress.     Breath sounds: Normal breath sounds.  Abdominal:     General: A surgical scar is  present. The ostomy site is clean. There is distension.     Palpations: Abdomen is soft.     Tenderness: There is no abdominal tenderness. There is no guarding or rebound.       Comments: Abdomen is soft, he is mildly distended and tympanic, non-tender, no rebound/guarding. Ileostomy in RLQ is pink, patent, now with significant gas and small amount of stool in bag.   Genitourinary:    Comments: Deferred Skin:    General: Skin is warm and dry.     Coloration: Skin is not jaundiced.  Neurological:     General: No focal deficit present.  Mental Status: He is alert and oriented to person, place, and time.  Psychiatric:        Mood and Affect: Mood normal.        Behavior: Behavior normal.      Labs:     Latest Ref Rng & Units 04/28/2022    5:24 AM 04/27/2022    1:30 PM 11/19/2015   12:22 PM  CBC  WBC 4.0 - 10.5 K/uL 5.3  18.6  9.2   Hemoglobin 13.0 - 17.0 g/dL 14.2  15.3  15.8   Hematocrit 39.0 - 52.0 % 45.7  50.3  44.7   Platelets 150 - 400 K/uL 344  400  189       Latest Ref Rng & Units 04/28/2022    5:24 AM 04/27/2022    1:30 PM 01/11/2019    9:29 AM  CMP  Glucose 70 - 99 mg/dL 165  189  260   BUN 8 - 23 mg/dL '23  17  7   '$ Creatinine 0.61 - 1.24 mg/dL 0.87  0.81  0.82   Sodium 135 - 145 mmol/L 134  134  134   Potassium 3.5 - 5.1 mmol/L 4.2  4.6  4.2   Chloride 98 - 111 mmol/L 105  103  99   CO2 22 - 32 mmol/L '23  19  23   '$ Calcium 8.9 - 10.3 mg/dL 8.5  9.7  9.5   Total Protein 6.5 - 8.1 g/dL  9.5  7.2   Total Bilirubin 0.3 - 1.2 mg/dL  0.8  <0.2   Alkaline Phos 38 - 126 U/L  85  74   AST 15 - 41 U/L  25  18   ALT 0 - 44 U/L  25  17     Imaging studies:   CT Abdomen/Pelvis (04/27/2022) personally reviewed with diffusely distended small bowel to level of ileostomy, no free air, no evidence of bowel compromise, and radiologist report reviewed below:  IMPRESSION: 1. Findings compatible with small-bowel obstruction with transition point at the level of the right  lower quadrant ileostomy. Mesenteric edema is present. 2. Small amount of free fluid and interloop fluid. 3. Cholelithiasis.  KUB (04/28/2022) personally reviewed still with dilated loops of bowel, and radiologist report reviewed below:  IMPRESSION: Small-bowel dilatation consistent with obstruction or ileus.   Assessment/Plan: (ICD-10's: K3.7) 71 y.o. male now with return of ileostomy function and lack of nausea/emesis admitted with likely ileus s/p partial proctectomy with end ileostomy for IBD at [facility] (11/05/2021)   - Given resolution in symptoms and return of ileostomy function, I think it is reasonable to start CLD. Can advance as tolerated today as long as he continues to do well clinically.    - No role for NGT - No indication for surgical intervention    - Monitor abdominal examination; on-going ostomy function - Pain control prn; antiemetics prn - Mobilize    - Further management per primary service; we will follow   All of the above findings and recommendations were discussed with the patient, and all of patient's questions were answered to his expressed satisfaction.  Thank you for the opportunity to participate in this patient's care.   -- Edison Simon, PA-C [facility] 04/28/2022, 8:06 AM M-F: 7am - 4pm

## 2022-04-28 NOTE — Assessment & Plan Note (Signed)
-   The patient with placed on supplemental coverage with NovoLog. - We will hold off metformin.

## 2022-04-28 NOTE — Assessment & Plan Note (Signed)
-   We will continue his Lexapro.

## 2022-04-28 NOTE — Assessment & Plan Note (Signed)
Will continue statin therapy. ?

## 2022-04-29 ENCOUNTER — Inpatient Hospital Stay: Payer: Medicare Other

## 2022-04-29 DIAGNOSIS — N179 Acute kidney failure, unspecified: Secondary | ICD-10-CM | POA: Diagnosis present

## 2022-04-29 DIAGNOSIS — K56609 Unspecified intestinal obstruction, unspecified as to partial versus complete obstruction: Secondary | ICD-10-CM | POA: Diagnosis not present

## 2022-04-29 DIAGNOSIS — K567 Ileus, unspecified: Secondary | ICD-10-CM

## 2022-04-29 DIAGNOSIS — E119 Type 2 diabetes mellitus without complications: Secondary | ICD-10-CM | POA: Diagnosis not present

## 2022-04-29 LAB — URINALYSIS, COMPLETE (UACMP) WITH MICROSCOPIC
Bilirubin Urine: NEGATIVE
Glucose, UA: NEGATIVE mg/dL
Hgb urine dipstick: NEGATIVE
Ketones, ur: NEGATIVE mg/dL
Nitrite: NEGATIVE
Protein, ur: 30 mg/dL — AB
Specific Gravity, Urine: 1.018 (ref 1.005–1.030)
pH: 5 (ref 5.0–8.0)

## 2022-04-29 LAB — CBC
HCT: 42.2 % (ref 39.0–52.0)
Hemoglobin: 12.9 g/dL — ABNORMAL LOW (ref 13.0–17.0)
MCH: 24.7 pg — ABNORMAL LOW (ref 26.0–34.0)
MCHC: 30.6 g/dL (ref 30.0–36.0)
MCV: 80.7 fL (ref 80.0–100.0)
Platelets: 327 10*3/uL (ref 150–400)
RBC: 5.23 MIL/uL (ref 4.22–5.81)
RDW: 17.6 % — ABNORMAL HIGH (ref 11.5–15.5)
WBC: 8.3 10*3/uL (ref 4.0–10.5)
nRBC: 0 % (ref 0.0–0.2)

## 2022-04-29 LAB — BASIC METABOLIC PANEL
Anion gap: 8 (ref 5–15)
BUN: 42 mg/dL — ABNORMAL HIGH (ref 8–23)
CO2: 19 mmol/L — ABNORMAL LOW (ref 22–32)
Calcium: 8.5 mg/dL — ABNORMAL LOW (ref 8.9–10.3)
Chloride: 106 mmol/L (ref 98–111)
Creatinine, Ser: 2.71 mg/dL — ABNORMAL HIGH (ref 0.61–1.24)
GFR, Estimated: 24 mL/min — ABNORMAL LOW (ref >60)
Glucose, Bld: 133 mg/dL — ABNORMAL HIGH (ref 70–99)
Potassium: 4.9 mmol/L (ref 3.5–5.1)
Sodium: 133 mmol/L — ABNORMAL LOW (ref 135–145)

## 2022-04-29 LAB — CREATININE, URINE, RANDOM: Creatinine, Urine: 362 mg/dL

## 2022-04-29 LAB — SODIUM, URINE, RANDOM: Sodium, Ur: <10 mmol/L

## 2022-04-29 LAB — GLUCOSE, CAPILLARY: Glucose-Capillary: 170 mg/dL — ABNORMAL HIGH (ref 70–99)

## 2022-04-29 LAB — MAGNESIUM: Magnesium: 2.4 mg/dL (ref 1.7–2.4)

## 2022-04-29 MED ORDER — ENOXAPARIN SODIUM 30 MG/0.3ML IJ SOSY
30.0000 mg | PREFILLED_SYRINGE | INTRAMUSCULAR | Status: DC
  Filled 2022-04-29: qty 0.3

## 2022-04-29 MED ORDER — METOPROLOL TARTRATE 25 MG PO TABS: 12.5000 mg | ORAL_TABLET | Freq: Two times a day (BID) | ORAL | Status: DC

## 2022-04-29 MED ORDER — HYDROCORTISONE ACETATE 25 MG RE SUPP
25.0000 mg | Freq: Every day | RECTAL | Status: DC
  Filled 2022-04-29: qty 1

## 2022-04-29 MED ORDER — INSULIN ASPART 100 UNIT/ML IJ SOLN: 0.0000 [IU] | Freq: Three times a day (TID) | INTRAMUSCULAR | Status: DC

## 2022-04-29 MED ORDER — SODIUM CHLORIDE 0.9 % IV BOLUS
500.0000 mL | Freq: Once | INTRAVENOUS | Status: AC
  Administered 2022-04-29: 500 mL via INTRAVENOUS

## 2022-04-29 NOTE — Progress Notes (Signed)
[facility] SURGICAL PROGRESS NOTE (cpt [phone])  Hospital Day(s): 2.   Interval History: Patient seen and examined, no acute events or new complaints overnight. Patient reports he feels much better. He denies fever, chills, nausea, emesis, or abdominal pain. He remains without leukocytosis; 8.3K. Now with quite significant AKI; sCr - 2.71; UO - unmeasured x3. Hyponatremia to 133 otherwise no electrolyte derangements. He is on soft diet. Continues to have ileostomy function despite nothing recorded.   Please note, patient and wife note that everyone in their house this week has had a "flu bug."   Review of Systems:  Constitutional: denies fever, chills  HEENT: denies cough or congestion  Respiratory: denies any shortness of breath  Cardiovascular: denies chest pain or palpitations  Gastrointestinal: denies abdominal pain, N/V Genitourinary: denies burning with urination or urinary frequency Musculoskeletal: denies pain, decreased motor or sensation  Vital signs in last 24 hours: [min-max] current  Temp:  [97.6 F (36.4 C)-98.5 F (36.9 C)] 97.6 F (36.4 C) (12/20 0513) Pulse Rate:  [55-60] 55 (12/20 0513) Resp:  [14-18] 17 (12/20 0513) BP: (95-133)/(51-68) 95/51 (12/20 0513) SpO2:  [94 %-98 %] 96 % (12/20 0513)     Height: '5\' 7"'$  (170.2 cm) Weight: 86.2 kg BMI (Calculated): 29.75   Intake/Output last 2 shifts:  No intake/output data recorded.   Physical Exam:  Constitutional: alert, cooperative and no distress  HENT: normocephalic without obvious abnormality  Eyes: PERRL, EOM's grossly intact and symmetric  Respiratory: breathing non-labored at rest  Cardiovascular: regular rate and sinus rhythm  Gastrointestinal: Abdomen is soft, non-tender, no rebound/guarding. Ileostomy in RLQ is pink, patent, now with significant gas and small amount of stool in bag.    Musculoskeletal: no edema or wounds, motor and sensation grossly intact, NT    Labs:     Latest Ref Rng  & Units 04/29/2022    4:24 AM 04/28/2022    5:24 AM 04/27/2022    1:30 PM  CBC  WBC 4.0 - 10.5 K/uL 8.3  5.3  18.6   Hemoglobin 13.0 - 17.0 g/dL 12.9  14.2  15.3   Hematocrit 39.0 - 52.0 % 42.2  45.7  50.3   Platelets 150 - 400 K/uL 327  344  400       Latest Ref Rng & Units 04/29/2022    4:24 AM 04/28/2022    5:24 AM 04/27/2022    1:30 PM  CMP  Glucose 70 - 99 mg/dL 133  165  189   BUN 8 - 23 mg/dL 42  23  17   Creatinine 0.61 - 1.24 mg/dL 2.71  0.87  0.81   Sodium 135 - 145 mmol/L 133  134  134   Potassium 3.5 - 5.1 mmol/L 4.9  4.2  4.6   Chloride 98 - 111 mmol/L 106  105  103   CO2 22 - 32 mmol/L '19  23  19   '$ Calcium 8.9 - 10.3 mg/dL 8.5  8.5  9.7   Total Protein 6.5 - 8.1 g/dL   9.5   Total Bilirubin 0.3 - 1.2 mg/dL   0.8   Alkaline Phos 38 - 126 U/L   85   AST 15 - 41 U/L   25   ALT 0 - 44 U/L   25      Imaging studies:   KUB (04/29/2022) personally reviewed showing slight improvement in small bowel dilation, and radiologist report reviewed below:  IMPRESSION: Small-bowel dilatation consistent with obstruction or ileus.  Assessment/Plan: (ICD-10's:  K24.7) 71 y.o. male now with return of ileostomy function and lack of nausea/emesis admitted with likely ileus vs enteritis s/p partial proctectomy with end ileostomy for IBD at [facility] (11/05/2021)   - Suspect bowel presentation secondary to ileus from enteritis given updated history given by his wife today. Nothing to do from surgical perspective.    Faythe Ghee for diet  - IVF Resuscitation for AKI             - Monitor abdominal examination; on-going ostomy function - Pain control prn; antiemetics prn - Mobilize as tolerated             - Further management per primary service; we will follow     - Discharge Planning: Nothing further from surgical perspective. We will sign off but remain available as needed. Likely needs another 24 hours for IVF resuscitation given AKI.   All of the above findings and recommendations  were discussed with the patient, patient's family (wife at bedside), and the medical team, and all of patient's and family's questions were answered to their expressed satisfaction.   -- Edison Simon, PA-C [facility] 04/29/2022, 7:39 AM M-F: 7am - 4pm

## 2022-04-29 NOTE — Progress Notes (Signed)
Mobility Specialist - Progress Note   04/29/22 0857  Mobility  Activity Ambulated independently in hallway;Stood at bedside;Dangled on edge of bed  Level of Assistance Independent  Assistive Device None  Distance Ambulated (ft) 170 ft  Activity Response Tolerated well  Mobility Referral Yes  $Mobility charge 1 Mobility   Pt supine in bed on RA upon arrival. Pt STS and ambulates 1 lap around NS indep. Pt returns to bed with needs in reach.   Gretchen Short  Mobility Specialist  04/29/22 8:58 AM

## 2022-04-29 NOTE — Progress Notes (Signed)
PHARMACIST - PHYSICIAN COMMUNICATION  CONCERNING:  Enoxaparin (Lovenox) for DVT Prophylaxis    RECOMMENDATION: Patient was prescribed enoxaprin '40mg'$  q24 hours for VTE prophylaxis.   Filed Weights   04/27/22 1315  Weight: 86.2 kg (190 lb)    Body mass index is 29.76 kg/m.  Estimated Creatinine Clearance: 25.4 mL/min (A) (by C-G formula based on SCr of 2.71 mg/dL (H)).  Patient is candidate for enoxaparin '30mg'$  every 24 hours based on CrCl <32m/min   DESCRIPTION: Pharm[facility].  Patient is now receiving enoxaparin 30 mg every 24 hours    CGlean Salvo PharmD, BCPS Clinical Pharmacist  04/29/2022 8:20 AM

## 2022-04-29 NOTE — Progress Notes (Signed)
PROGRESS NOTE    Benjamin Chapman  SEG:[id number] DOB: 08/10/1950 DOA: 04/27/2022 PCP: System, Provider Not In    Chief Complaint  Patient presents with   Abdominal Pain    Brief Narrative:  Benjamin Chapman is a 71 y.o. Caucasian male with medical history of essential hypertension, type 2 diabetes mellitus, psoriatic arthritis, presented to hospital with acute abdominal distention and pain with nausea and vomiting without any output from his ileostomy.  In the ED, he was mildly hypertensive. Abdominal CT scan showed small bowel obstruction with transition point at the right lower quadrant ileostomy area with mesenteric edema.  General surgery Dr. Christian Mate was notified and patient was admitted hospital for further evaluation and treatment.   Assessment & Plan:   Principal Problem:   SBO (small bowel obstruction) (HCC) Active Problems:   Controlled type 2 diabetes mellitus without complication, without long-term current use of insulin (HCC)   Dyslipidemia   Depression   History of ulcerative colitis   Small bowel obstruction (HCC)   Ileus, postoperative (HCC)   ARF (acute renal failure) ([place])   Ileus (HCC)  #1 small bowel obstruction versus ileus -Patient had presented with acute abdominal distention and pain, nausea and vomiting with decreased/no output from ileostomy. -CT abdomen and pelvis done concerning for small bowel obstruction with transition point in the right lower quadrant ileostomy area with mesenteric edema. -Patient seen in consultation by general surgery, who feel bowel presentation likely secondary to ileus from enteritis given updated history by wife with family with enteritis recently. -Patient with ileostomy output noted. -Patient with no further nausea or vomiting, diet advanced which he is tolerating. -Per general surgery nothing to do from a surgical perspective at this time. -IV fluids, supportive care.  2.  Acute renal failure -Patient noted with a bump  in creatinine to 2.71 today from 0.87 over the past 24 hours. -Likely secondary to a prerenal azotemia as patient noted to have a soft blood pressure as low as 95/51 over the past 24 hours, in the setting of ACE inhibitor. -Urinalysis repeated this morning with trace leukocytes, nitrite negative, 30 of protein. -Urine sodium < 10. -Renal ultrasound negative for hydronephrosis. -Discontinue lisinopril or metoprolol as blood pressure soft. -Normal saline 500 cc bolus x 1 and increase IV fluids to normal saline at 125 cc an hour. -Monitor urine output. -Repeat labs in the AM.  3.  History of ulcerative colitis -Status post colectomy and end ileostomy for IBD at [facility] 11/05/2021. -Resume home regimen hydrocortisone suppositories.  4.  Depression -Continue Lexapro.  5. ?? Hypertension -Blood pressure soft. -Discontinue lisinopril and metoprolol.  6.  Well-controlled type 2 diabetes mellitus without complication, without long-term insulin use. -Check a hemoglobin A1c. -Place on SSI. -Check CBGs before meals and at bedtime. -Hold oral hypoglycemic agents.  7.  Hyperlipidemia -Hold statin.    DVT prophylaxis: Lovenox. Code Status: Full Family Communication: Updated patient and wife at bedside. Disposition: Likely home once clinically improved, resolution of acute renal failure hopefully in the next 1 to 2 days.  Status is: Inpatient Remains inpatient appropriate because: Severity of illness   Consultants:  General surgery: Dr. Christian Mate 04/28/2022  Procedures:  CT abdomen and pelvis 04/27/2022 Abdominal films 04/28/2022, 04/29/2022 Renal ultrasound 04/29/2022  Antimicrobials:  Anti-infectives (From admission, onward)    None         Subjective: Laying in bed.  Denies any chest pain.  No shortness of breath.  No abdominal pain.  States ileostomy bag emptying well.  Tolerating a soft diet today without any nausea or emesis.  Wife at bedside.  Per wife the whole family has  had a stomach bug.  Objective: Vitals:   04/29/22 0513 04/29/22 0830 04/29/22 0831 04/29/22 1603  BP: (!) 95/51 (!) 116/51 (!) 116/51 120/61  Pulse: (!) 55 (!) 55 (!) 55 (!) 55  Resp: '17 16 16 20  '$ Temp: 97.6 F (36.4 C) 98.7 F (37.1 C) 98.7 F (37.1 C) 98.1 F (36.7 C)  TempSrc: Oral     SpO2: 96% 98% 99% 98%  Weight:      Height:        Intake/Output Summary (Last 24 hours) at 04/29/2022 1751 Last data filed at 04/29/2022 0859 Gross per 24 hour  Intake --  Output 100 ml  Net -100 ml   Filed Weights   04/27/22 1315  Weight: 86.2 kg    Examination:  General exam: Appears calm and comfortable  Respiratory system: Clear to auscultation.  No wheezes, no crackles, no rhonchi.  Respiratory effort normal. Cardiovascular system: S1 & S2 heard, RRR. No JVD, murmurs, rubs, gallops or clicks. No pedal edema. Gastrointestinal system: Abdomen is nondistended, soft and nontender. No organomegaly or masses felt. Normal bowel sounds heard.  Ileostomy bag with loose dark stool noted. Central nervous system: Alert and oriented. No focal neurological deficits. Extremities: Symmetric 5 x 5 power. Skin: No rashes, lesions or ulcers Psychiatry: Judgement and insight appear normal. Mood & affect appropriate.     Data Reviewed: I have personally reviewed following labs and imaging studies  CBC: Recent Labs  Lab 04/27/22 1330 04/28/22 0524 04/29/22 0424  WBC 18.6* 5.3 8.3  HGB 15.3 14.2 12.9*  HCT 50.3 45.7 42.2  MCV 80.1 79.2* 80.7  PLT 400 344 161    Basic Metabolic Panel: Recent Labs  Lab 04/27/22 1330 04/28/22 0524 04/29/22 0424  NA 134* 134* 133*  K 4.6 4.2 4.9  CL 103 105 106  CO2 19* 23 19*  GLUCOSE 189* 165* 133*  BUN 17 23 42*  CREATININE 0.81 0.87 2.71*  CALCIUM 9.7 8.5* 8.5*  MG  --   --  2.4    GFR: Estimated Creatinine Clearance: 25.4 mL/min (A) (by C-G formula based on SCr of 2.71 mg/dL (H)).  Liver Function Tests: Recent Labs  Lab 04/27/22 1330   AST 25  ALT 25  ALKPHOS 85  BILITOT 0.8  PROT 9.5*  ALBUMIN 4.4    CBG: No results for input(s): "GLUCAP" in the last 168 hours.   No results found for this or any previous visit (from the past 240 hour(s)).       Radiology Studies: US RENAL  Result Date: 04/29/2022 CLINICAL DATA:  Acute renal failure EXAM: RENAL / URINARY TRACT ULTRASOUND COMPLETE COMPARISON:  CT 04/27/2022. FINDINGS: Right Kidney: Renal measurements: 11.1 x 5.9 x 5.9 cm = volume: 203 mL. Mildly increased cortical echogenicity. No mass or hydronephrosis visualized. Left Kidney: Renal measurements: 11.3 x 4.9 x 4.8 cm = volume: 138 mL. Mildly increased cortical echogenicity. No mass or hydronephrosis visualized. Bladder: Appears normal for degree of bladder distention. Bilateral ureteral jets are seen. Other: None. IMPRESSION: No hydronephrosis. Mildly increased renal cortical echogenicity, as can be seen in medical renal disease. Electronically Signed   By: Maurine Simmering M.D.   On: 04/29/2022 10:13   DG Abd 2 Views  Result Date: 04/29/2022 : CLINICAL DATA: SBO EXAM: ABDOMEN - 2 VIEW COMPARISON: 04/28/2022 FINDINGS: Dilated small bowel loops consistent with obstruction  or ileus. Right lower quadrant colostomy. No organomegaly. No radiopaque calculi identified. IMPRESSION: Small-bowel dilatation consistent with obstruction or ileus. Electronically Signed   By: Sammie Bench M.D.   On: 04/29/2022 09:05   DG Abd 2 Views  Result Date: 04/28/2022 CLINICAL DATA:  SBO EXAM: ABDOMEN - 2 VIEW COMPARISON:  None Available. FINDINGS: Dilated small bowel loops consistent with obstruction or ileus. Right lower quadrant colostomy. No organomegaly. No radiopaque calculi identified. IMPRESSION: Small-bowel dilatation consistent with obstruction or ileus. Electronically Signed   By: Sammie Bench M.D.   On: 04/28/2022 09:27   CT Abdomen Pelvis W Contrast  Result Date: 04/27/2022 CLINICAL DATA:  Cramping, decreased output of  ileostomy. EXAM: CT ABDOMEN AND PELVIS WITH CONTRAST TECHNIQUE: Multidetector CT imaging of the abdomen and pelvis was performed using the standard protocol following bolus administration of intravenous contrast. RADIATION DOSE REDUCTION: This exam was performed according to the departmental dose-optimization program which includes automated exposure control, adjustment of the mA and/or kV according to patient size and/or use of iterative reconstruction technique. CONTRAST:  138m OMNIPAQUE IOHEXOL 300 MG/ML  SOLN COMPARISON:  CT abdomen and pelvis 06/22/2016 FINDINGS: Lower chest: There is atelectasis in the lung bases. Hepatobiliary: Gallstones are present. There is no biliary ductal dilatation. The liver is within normal limits. Pancreas: Unremarkable. No pancreatic ductal dilatation or surrounding inflammatory changes. Spleen: Normal in size without focal abnormality. Adrenals/Urinary Tract: Adrenal glands are unremarkable. Kidneys are normal, without renal calculi, focal lesion, or hydronephrosis. Bladder is unremarkable. Stomach/Bowel: Patient is status post colectomy with right lower quadrant ileostomy. There are dilated fluid-filled small bowel loops to the level of the ostomy. The stomach is also dilated with air-fluid levels. Small bowel loops measure up to 4.1 cm. There is mild mesenteric edema. There is no pneumatosis or wall thickening. There is no free air. Vascular/Lymphatic: Aortic atherosclerosis. No enlarged abdominal or pelvic lymph nodes. Reproductive: Prostate is unremarkable. Other: There is a small amount of interloop fluid. There is a small amount of free fluid in the right abdomen. There is no free air or focal abdominal wall hernia. Musculoskeletal: Degenerative changes affect the spine. IMPRESSION: 1. Findings compatible with small-bowel obstruction with transition point at the level of the right lower quadrant ileostomy. Mesenteric edema is present. 2. Small amount of free fluid and  interloop fluid. 3. Cholelithiasis. Aortic Atherosclerosis (ICD10-I70.0). Electronically Signed   By: ARonney AstersM.D.   On: 04/27/2022 19:05        Scheduled Meds:  enoxaparin (LOVENOX) injection  30 mg Subcutaneous Q24H   escitalopram  5 mg Oral Daily   hydrocortisone  25 mg Rectal QHS   [START ON 04/30/2022] insulin aspart  0-9 Units Subcutaneous TID WC   Continuous Infusions:  sodium chloride 125 mL/hr at 04/29/22 0852     LOS: 2 days    Time spent: 40 minutes    DIrine Seal MD [facility]   To contact the attending provider between 7A-7P or the covering provider during after hours 7P-7A, please log into the web site [email or web address] and access using universal [place] password for that web site. If you do not have the password, please call the hospital operator.  04/29/2022, 5:51 PM

## 2022-04-29 NOTE — Progress Notes (Signed)
  Transition of Care (TOC) Screening Note   Patient Details  Name: Kerwin Augustus Date of Birth: 1950-09-22   Transition of Care [facility]) CM/SW Contact:    Quin Hoop, LCSW Phone Number: 04/29/2022, 9:37 AM    Transition of Care Department [facility]) has reviewed patient and no TOC needs have been identified at this time. We will continue to monitor patient advancement through interdisciplinary progression rounds. If new patient transition needs arise, please place a TOC consult.

## 2022-04-30 DIAGNOSIS — K567 Ileus, unspecified: Secondary | ICD-10-CM | POA: Diagnosis not present

## 2022-04-30 DIAGNOSIS — R011 Cardiac murmur, unspecified: Secondary | ICD-10-CM

## 2022-04-30 DIAGNOSIS — N179 Acute kidney failure, unspecified: Secondary | ICD-10-CM | POA: Diagnosis not present

## 2022-04-30 DIAGNOSIS — E119 Type 2 diabetes mellitus without complications: Secondary | ICD-10-CM | POA: Diagnosis not present

## 2022-04-30 DIAGNOSIS — K56609 Unspecified intestinal obstruction, unspecified as to partial versus complete obstruction: Secondary | ICD-10-CM | POA: Diagnosis not present

## 2022-04-30 LAB — CBC WITH DIFFERENTIAL/PLATELET
Abs Immature Granulocytes: 0.05 10*3/uL (ref 0.00–0.07)
Basophils Absolute: 0 10*3/uL (ref 0.0–0.1)
Basophils Relative: 0 %
Eosinophils Absolute: 0.3 10*3/uL (ref 0.0–0.5)
Eosinophils Relative: 4 %
HCT: 38.1 % — ABNORMAL LOW (ref 39.0–52.0)
Hemoglobin: 11.8 g/dL — ABNORMAL LOW (ref 13.0–17.0)
Immature Granulocytes: 1 %
Lymphocytes Relative: 17 %
Lymphs Abs: 1.4 10*3/uL (ref 0.7–4.0)
MCH: 24.7 pg — ABNORMAL LOW (ref 26.0–34.0)
MCHC: 31 g/dL (ref 30.0–36.0)
MCV: 79.7 fL — ABNORMAL LOW (ref 80.0–100.0)
Monocytes Absolute: 1 10*3/uL (ref 0.1–1.0)
Monocytes Relative: 12 %
Neutro Abs: 5.6 10*3/uL (ref 1.7–7.7)
Neutrophils Relative %: 66 %
Platelets: 275 10*3/uL (ref 150–400)
RBC: 4.78 MIL/uL (ref 4.22–5.81)
RDW: 17 % — ABNORMAL HIGH (ref 11.5–15.5)
WBC: 8.4 10*3/uL (ref 4.0–10.5)
nRBC: 0 % (ref 0.0–0.2)

## 2022-04-30 LAB — BASIC METABOLIC PANEL
Anion gap: 5 (ref 5–15)
BUN: 26 mg/dL — ABNORMAL HIGH (ref 8–23)
CO2: 19 mmol/L — ABNORMAL LOW (ref 22–32)
Calcium: 8.3 mg/dL — ABNORMAL LOW (ref 8.9–10.3)
Chloride: 113 mmol/L — ABNORMAL HIGH (ref 98–111)
Creatinine, Ser: 0.95 mg/dL (ref 0.61–1.24)
GFR, Estimated: >60 mL/min (ref >60)
Glucose, Bld: 112 mg/dL — ABNORMAL HIGH (ref 70–99)
Potassium: 4.5 mmol/L (ref 3.5–5.1)
Sodium: 137 mmol/L (ref 135–145)

## 2022-04-30 LAB — HEMOGLOBIN A1C
Hgb A1c MFr Bld: 7.2 % — ABNORMAL HIGH (ref 4.8–5.6)
Mean Plasma Glucose: 160 mg/dL

## 2022-04-30 LAB — GLUCOSE, CAPILLARY: Glucose-Capillary: 110 mg/dL — ABNORMAL HIGH (ref 70–99)

## 2022-04-30 LAB — MAGNESIUM: Magnesium: 2.4 mg/dL (ref 1.7–2.4)

## 2022-04-30 MED ORDER — SODIUM BICARBONATE 650 MG PO TABS: 650.0000 mg | ORAL_TABLET | Freq: Two times a day (BID) | ORAL | 0 refills | Status: AC

## 2022-04-30 MED ORDER — SODIUM BICARBONATE 650 MG PO TABS: 650.0000 mg | ORAL_TABLET | Freq: Two times a day (BID) | ORAL | Status: DC

## 2022-04-30 MED ORDER — ENOXAPARIN SODIUM 40 MG/0.4ML IJ SOSY: 40.0000 mg | PREFILLED_SYRINGE | INTRAMUSCULAR | Status: DC

## 2022-04-30 NOTE — Progress Notes (Signed)
Pt d/c home via wife. IV removed intact. VSS. Education completed. No questions at this time. All belongings sent with pt.

## 2022-04-30 NOTE — Progress Notes (Signed)
PT Cancellation Note  Patient Details Name: Benjamin Chapman MRN: [id number] DOB: 1951/01/01   Cancelled Treatment:    Reason Eval/Treat Not Completed: PT screened, no needs identified, will sign off Per chart review and RN, patient ambulatory independently and is at baseline for mobility. Will defer mobility to mobility specialist and nursing staff. No skilled PT needs identified. PT will sign off.   Iran Kievit A. Gilford Rile PT, DPT [facility] - Acute Rehabilitation Services   Bing Duffey A Larance Ratledge 04/30/2022, 8:45 AM

## 2022-04-30 NOTE — Progress Notes (Signed)
OT Cancellation Note  Patient Details Name: Benjamin Chapman MRN: [id number] DOB: October 29, 1950   Cancelled Treatment:    Reason Eval/Treat Not Completed: OT screened, no needs identified, will sign off. Per chart review and RN, pt is ambulating independently without physical assistance and is at baseline level for self care and mobility. Pt continues to work with mobility team at this time. No skilled need for OT intervention. OT to complete order.   Darleen Crocker, MS, OTR/L , CBIS ascom [phone]  04/30/22, 8:30 AM

## 2022-04-30 NOTE — Discharge Summary (Signed)
Physician Discharge Summary  Benjamin Chapman:[id number] DOB: 15-May-1950 DOA: 04/27/2022  PCP: System, Provider Not In  Admit date: 04/27/2022 Discharge date: 04/30/2022  Time spent: 55 minutes  Recommendations for Outpatient Follow-up:  Follow-up with PCP in 2 weeks.  On follow-up patient will need a basic metabolic profile done to follow-up on electrolytes and renal function.  Patient's ileus versus SBO need to be followed up upon. Follow-up with Benjamin Chapman, cardiology on 05/18/2022 at 9:15 AM for follow-up on murmur.  On follow-up murmur will need to be reassessed and need to be determined whether patient needs a repeat 2D echo.   Discharge Diagnoses:  Principal Problem:   SBO (small bowel obstruction) (HCC) Active Problems:   Ileus ([place])   Controlled type 2 diabetes mellitus without complication, without long-term current use of insulin (HCC)   Dyslipidemia   Depression   History of ulcerative colitis   Small bowel obstruction (HCC)   Ileus, postoperative (HCC)   ARF (acute renal failure) (HCC)   Murmur, cardiac   Discharge Condition: Stable and improved.  Diet recommendation: Carb modified diet  Filed Weights   04/27/22 1315  Weight: 86.2 kg    History of present illness:  HPI per Benjamin Chapman is a 71 y.o. Caucasian male with medical history significant for essential hypertension, type 2 diabetes mellitus, psoriatic arthritis, who presented to the emergency room with acute onset of abdominal distention and lower abdominal pain over the last couple of days with associated nausea and vomiting.  He has not had any output from his right ileostomy.  No dysuria, oliguria or hematuria or flank pain.  No chest pain or dyspnea or cough or wheezing.  No fever or chills.   ED Course: When he Came to the ER, BP was 160/75 with otherwise normal vital signs.  Labs revealed mild open uremia and a blood glucose of 189 with CO2 of 19 and otherwise unremarkable CMP.  CBC  shows significant Oxydose of 18.6 and UA showed more than 1046 specific gravity and 30 protein with 0-5 WBCs.   Imaging: Abdominal pelvic CT scan revealed small bowel obstruction with transition point at the level of the right lower quadrant ileostomy with mesenteric edema, small amount of free fluid and interloop fluid, cholelithiasis and aortic atherosclerosis.   Benjamin Chapman was notified about the patient and at this time did not recommend NG tube placement.  The patient had significant output in the ER from his ileostomy.  He was still having abdominal distention.  He will be admitted to a medical bed for further evaluation and management.  Hospital Course:  #1 small bowel obstruction versus ileus -Patient had presented with acute abdominal distention and pain, nausea and vomiting with decreased/no output from ileostomy. -CT abdomen and pelvis done concerning for small bowel obstruction with transition point in the right lower quadrant ileostomy area with mesenteric edema. -Patient seen in consultation by general surgery, who feel bowel presentation likely secondary to ileus from enteritis given updated history by wife with family with enteritis recently. -Patient with ileostomy output noted. -Patient with no further nausea or vomiting, diet advanced which he tolerated. -Per general surgery nothing to do from a surgical perspective at this time. -Patient improved clinically, will be discharged home in stable and improved condition.  Outpatient follow-up with PCP.   2.  Acute renal failure -Patient noted with a bump in creatinine to 2.71 (04/29/2022) from 0.87 over the past 24 hours. -Likely secondary to a prerenal azotemia as patient  noted to have a soft blood pressure as low as 95/51 over the past 24 hours, in the setting of ACE inhibitor. -Urinalysis repeated this morning with trace leukocytes, nitrite negative, 30 of protein. -Urine sodium < 10. -Renal ultrasound negative for  hydronephrosis. -Discontinued lisinopril or metoprolol as blood pressure soft. -Patient given a 500 cc bolus of normal saline x 1 placed on IV fluids with improvement with renal function such that by day of discharge creatinine was down to 0.95.    3.  History of ulcerative colitis -Status post colectomy and end ileostomy for IBD at [facility] 11/05/2021. -Resumed home regimen hydrocortisone suppositories.   4.  Depression -Patient maintained on Lexapro.   5. ?? Hypertension -Blood pressure soft. -Patient initially placed on lisinopril and metoprolol which were discontinued.   -BP was controlled during the hospitalization.   -Outpatient follow-up.  6.  Cardiac murmur -Patient noted to have a murmur left upper sternal border, however remained asymptomatic.  Case discussed with patient's primary cardiologist, Benjamin Chapman who noted that patient had a mild history of aortic stenosis and was in agreement with outpatient follow-up with cardiology for further evaluation and determination for need for repeat 2D echo.     7.  Well-controlled type 2 diabetes mellitus without complication, without long-term insulin use. -Hemoglobin A1c pending at time of discharge and needs to be followed up upon per PCP.   -His oral hypoglycemic agents were held during the hospitalization and will be resumed on discharge.   -Patient maintained on sliding scale insulin during the hospitalization.    8.  Hyperlipidemia -Statin held during the hospitalization and will be resumed on discharge.     Procedures: CT abdomen and pelvis 04/27/2022 Abdominal films 04/28/2022, 04/29/2022 Renal ultrasound 04/29/2022  Consultations: General surgery: Benjamin Chapman 04/28/2022  Discharge Exam: Vitals:   04/30/22 0503 04/30/22 0801  BP: 128/64 136/60  Pulse: (!) 53 64  Resp: 18 20  Temp: 98.5 F (36.9 C) 98.2 F (36.8 C)  SpO2: 97% 97%    General: NAD Cardiovascular: Regular rate and rhythm with 3/6 SEM. Respiratory:  Lungs clear to auscultation bilaterally.  No wheezes, no crackles, no rhonchi.  Fair air movement.  Speaking in full sentences.  Discharge Instructions   Discharge Instructions     Diet Carb Modified   Complete by: As directed    Increase activity slowly   Complete by: As directed       Allergies as of 04/30/2022       Reactions   Codeine Other (See Comments)   Nausea   Infliximab Other (See Comments)   Other Other (See Comments)        Medication List     TAKE these medications    acetaminophen 500 MG tablet Commonly known as: TYLENOL Take 500-1,000 mg by mouth every 6 (six) hours as needed for mild pain or moderate pain.   escitalopram 5 MG tablet Commonly known as: LEXAPRO Take 5 mg by mouth daily.   hydrocortisone 25 MG suppository Commonly known as: ANUSOL-HC Place 25 mg rectally at bedtime.   metFORMIN 500 MG tablet Commonly known as: GLUCOPHAGE Take 1,000 mg by mouth daily with breakfast.   nitroGLYCERIN 0.4 MG SL tablet Commonly known as: NITROSTAT DISSOLVE 1 TABLET UNDER THE TONGUE EVERY 5 MINUTES AS  NEEDED FOR CHEST PAIN. MAX  OF 3 TABLETS IN 15 MINUTES. CALL 911 IF PAIN PERSISTS.   rosuvastatin 5 MG tablet Commonly known as: CRESTOR Take 5 mg by mouth daily.  sodium bicarbonate 650 MG tablet Take 1 tablet (650 mg total) by mouth 2 (two) times daily for 2 days.   traZODone 50 MG tablet Commonly known as: DESYREL Take 50-100 mg by mouth at bedtime as needed for sleep.       Allergies  Allergen Reactions   Codeine Other (See Comments)    Nausea    Infliximab Other (See Comments)   Other Other (See Comments)    Follow-up Information     PCP. Schedule an appointment as soon as possible for a visit in 2 week(s).          Adrian Prows, MD. Schedule an appointment as soon as possible for a visit on 05/18/2022.   Specialty: Cardiology Why: Follow-up on 05/18/2022 at 9:15 AM.  Will need to determine if needs-2D echo on follow-up and  further evaluation of murmur. Contact information: [address] [place] [address] [phone]                  The results of significant diagnostics from this hospitalization (including imaging, microbiology, ancillary and laboratory) are listed below for reference.    Significant Diagnostic Studies: US RENAL  Result Date: 04/29/2022 CLINICAL DATA:  Acute renal failure EXAM: RENAL / URINARY TRACT ULTRASOUND COMPLETE COMPARISON:  CT 04/27/2022. FINDINGS: Right Kidney: Renal measurements: 11.1 x 5.9 x 5.9 cm = volume: 203 mL. Mildly increased cortical echogenicity. No mass or hydronephrosis visualized. Left Kidney: Renal measurements: 11.3 x 4.9 x 4.8 cm = volume: 138 mL. Mildly increased cortical echogenicity. No mass or hydronephrosis visualized. Bladder: Appears normal for degree of bladder distention. Bilateral ureteral jets are seen. Other: None. IMPRESSION: No hydronephrosis. Mildly increased renal cortical echogenicity, as can be seen in medical renal disease. Electronically Signed   By: Maurine Simmering M.D.   On: 04/29/2022 10:13   DG Abd 2 Views  Result Date: 04/29/2022 : CLINICAL DATA: SBO EXAM: ABDOMEN - 2 VIEW COMPARISON: 04/28/2022 FINDINGS: Dilated small bowel loops consistent with obstruction or ileus. Right lower quadrant colostomy. No organomegaly. No radiopaque calculi identified. IMPRESSION: Small-bowel dilatation consistent with obstruction or ileus. Electronically Signed   By: Sammie Bench M.D.   On: 04/29/2022 09:05   DG Abd 2 Views  Result Date: 04/28/2022 CLINICAL DATA:  SBO EXAM: ABDOMEN - 2 VIEW COMPARISON:  None Available. FINDINGS: Dilated small bowel loops consistent with obstruction or ileus. Right lower quadrant colostomy. No organomegaly. No radiopaque calculi identified. IMPRESSION: Small-bowel dilatation consistent with obstruction or ileus. Electronically Signed   By: Sammie Bench M.D.   On: 04/28/2022 09:27   CT Abdomen  Pelvis W Contrast  Result Date: 04/27/2022 CLINICAL DATA:  Cramping, decreased output of ileostomy. EXAM: CT ABDOMEN AND PELVIS WITH CONTRAST TECHNIQUE: Multidetector CT imaging of the abdomen and pelvis was performed using the standard protocol following bolus administration of intravenous contrast. RADIATION DOSE REDUCTION: This exam was performed according to the departmental dose-optimization program which includes automated exposure control, adjustment of the mA and/or kV according to patient size and/or use of iterative reconstruction technique. CONTRAST:  17m OMNIPAQUE IOHEXOL 300 MG/ML  SOLN COMPARISON:  CT abdomen and pelvis 06/22/2016 FINDINGS: Lower chest: There is atelectasis in the lung bases. Hepatobiliary: Gallstones are present. There is no biliary ductal dilatation. The liver is within normal limits. Pancreas: Unremarkable. No pancreatic ductal dilatation or surrounding inflammatory changes. Spleen: Normal in size without focal abnormality. Adrenals/Urinary Tract: Adrenal glands are unremarkable. Kidneys are normal, without renal calculi, focal lesion,  or hydronephrosis. Bladder is unremarkable. Stomach/Bowel: Patient is status post colectomy with right lower quadrant ileostomy. There are dilated fluid-filled small bowel loops to the level of the ostomy. The stomach is also dilated with air-fluid levels. Small bowel loops measure up to 4.1 cm. There is mild mesenteric edema. There is no pneumatosis or wall thickening. There is no free air. Vascular/Lymphatic: Aortic atherosclerosis. No enlarged abdominal or pelvic lymph nodes. Reproductive: Prostate is unremarkable. Other: There is a small amount of interloop fluid. There is a small amount of free fluid in the right abdomen. There is no free air or focal abdominal wall hernia. Musculoskeletal: Degenerative changes affect the spine. IMPRESSION: 1. Findings compatible with small-bowel obstruction with transition point at the level of the right  lower quadrant ileostomy. Mesenteric edema is present. 2. Small amount of free fluid and interloop fluid. 3. Cholelithiasis. Aortic Atherosclerosis (ICD10-I70.0). Electronically Signed   By: Ronney Asters M.D.   On: 04/27/2022 19:05    Microbiology: No results found for this or any previous visit (from the past 240 hour(s)).   Labs: Basic Metabolic Panel: Recent Labs  Lab 04/27/22 1330 04/28/22 0524 04/29/22 0424 04/30/22 0539  NA 134* 134* 133* 137  K 4.6 4.2 4.9 4.5  CL 103 105 106 113*  CO2 19* 23 19* 19*  GLUCOSE 189* 165* 133* 112*  BUN 17 23 42* 26*  CREATININE 0.81 0.87 2.71* 0.95  CALCIUM 9.7 8.5* 8.5* 8.3*  MG  --   --  2.4 2.4   Liver Function Tests: Recent Labs  Lab 04/27/22 1330  AST 25  ALT 25  ALKPHOS 85  BILITOT 0.8  PROT 9.5*  ALBUMIN 4.4   Recent Labs  Lab 04/27/22 1330  LIPASE 48   No results for input(s): "AMMONIA" in the last 168 hours. CBC: Recent Labs  Lab 04/27/22 1330 04/28/22 0524 04/29/22 0424 04/30/22 0539  WBC 18.6* 5.3 8.3 8.4  NEUTROABS  --   --   --  5.6  HGB 15.3 14.2 12.9* 11.8*  HCT 50.3 45.7 42.2 38.1*  MCV 80.1 79.2* 80.7 79.7*  PLT 400 344 327 275   Cardiac Enzymes: No results for input(s): "CKTOTAL", "CKMB", "CKMBINDEX", "TROPONINI" in the last 168 hours. BNP: BNP (last 3 results) No results for input(s): "BNP" in the last 8760 hours.  ProBNP (last 3 results) No results for input(s): "PROBNP" in the last 8760 hours.  CBG: Recent Labs  Lab 04/29/22 2126 04/30/22 0804  GLUCAP 170* 110*       Signed:  Irine Seal MD.  [facility] 04/30/2022, 12:57 PM

## 2022-04-30 NOTE — Progress Notes (Signed)
Mobility Specialist - Progress Note   04/30/22 0814  Mobility  Activity Ambulated independently in hallway;Stood at bedside;Dangled on edge of bed  Level of Assistance Independent  Assistive Device None  Distance Ambulated (ft) 160 ft  Activity Response Tolerated well  Mobility Referral Yes  $Mobility charge 1 Mobility   Pt supine in bed on RA upon arrival. Pt STS and ambulates 1 lap around NS indep. Pt returns to bed with needs in reach.   Gretchen Short  Mobility Specialist  04/30/22 8:15 AM

## 2022-04-30 NOTE — Care Management Important Message (Signed)
Important Message  Patient Details  Name: Benjamin Chapman MRN: [id number] Date of Birth: [birth year]   Medicare Important Message Given:  N/A - LOS <3 / Initial given by admissions     Juliann Pulse A Mana Morison 04/30/2022, 9:03 AM

## 2022-05-17 NOTE — Progress Notes (Unsigned)
Primary Physician/Referring:  System, Provider Not In  Patient ID: Benjamin Chapman, male    DOB: 1950-10-01, 72 y.o.   MRN: [id number]  No chief complaint on file.  HPI:    Benjamin Chapman  is a 72 y.o. psoriatic arthritis, Hypertension, hyperlipidemia, type 2 diabetes, hypertension, ulcerative colitis, and psoriasis who presents for a one yesr  OV for evaluation of angina pectoris, he has had abnormal stress test 07/08/2018 imaging day moderate size inferolateral wall ischemia considered high risk on a submaximal stress test.  Echocardiogram at that time revealed trace aortic stenosis and mild diastolic dysfunction.  Since being on aggressive medical therapy, he has not had any further episodes of chest pain and feels well.  He did have Covid 19 in Feb 2021. Diarrhea is his main complaint.  As I was delayed, patient could not wait hence I made a telephone encounter call.  Past Medical History:  Diagnosis Date   Bilateral carotid bruits    Diabetes mellitus without complication (HCC)    Dyspnea    Hypertension    Psoriatic arthritis (HCC)    Systolic murmur of aorta    Ulcerative colitis, acute ([place])    Social History   Tobacco Use   Smoking status: Former    Packs/day: 1.00    Years: 20.00    Total pack years: 20.00    Types: Cigarettes    Quit date: 07/15/1998    Years since quitting: 23.8   Smokeless tobacco: Never  Substance Use Topics   Alcohol use: Yes    Comment: occasional   ROS  Review of Systems  Cardiovascular:  Negative for chest pain, dyspnea on exertion and leg swelling.  Gastrointestinal:  Positive for diarrhea.   Objective  There were no vitals taken for this visit.     04/30/2022    8:01 AM 04/30/2022    5:03 AM 04/29/2022    8:17 PM  Vitals with BMI  Systolic 542 706 237  Diastolic 60 64 58  Pulse 64 53 61    Physical exam not performed or limited due to virtual visit.   Laboratory examination:   Recent Labs    04/28/22 0524 04/29/22 0424  04/30/22 0539  NA 134* 133* 137  K 4.2 4.9 4.5  CL 105 106 113*  CO2 23 19* 19*  GLUCOSE 165* 133* 112*  BUN 23 42* 26*  CREATININE 0.87 2.71* 0.95  CALCIUM 8.5* 8.5* 8.3*  GFRNONAA >60 24* >60       Latest Ref Rng & Units 04/30/2022    5:39 AM 04/29/2022    4:24 AM 04/28/2022    5:24 AM  CMP  Glucose 70 - 99 mg/dL 112  133  165   BUN 8 - 23 mg/dL 26  42  23   Creatinine 0.61 - 1.24 mg/dL 0.95  2.71  0.87   Sodium 135 - 145 mmol/L 137  133  134   Potassium 3.5 - 5.1 mmol/L 4.5  4.9  4.2   Chloride 98 - 111 mmol/L 113  106  105   CO2 22 - 32 mmol/L '19  19  23   '$ Calcium 8.9 - 10.3 mg/dL 8.3  8.5  8.5       Latest Ref Rng & Units 04/30/2022    5:39 AM 04/29/2022    4:24 AM 04/28/2022    5:24 AM  CBC  WBC 4.0 - 10.5 K/uL 8.4  8.3  5.3   Hemoglobin 13.0 - 17.0 g/dL 11.8  12.9  14.2   Hematocrit 39.0 - 52.0 % 38.1  42.2  45.7   Platelets 150 - 400 K/uL 275  327  344    Lipid Panel     Component Value Date/Time   CHOL 113 01/11/2019 0929   TRIG 183 (H) 01/11/2019 0929   HDL 44 01/11/2019 0929   LDLCALC 39 01/11/2019 0929   External labs:    Labs 07/23/2021:  Serum glucose 100 mg, BUN 15, creatinine 0.87, EGFR 92, LFTs normal.  Hb 13.2/HCT 41.7, platelets 355.  Total cholesterol 143, triglycerides 169, HDL 70, LDL 46.  CRP 27.  Labs 01/15/2021:  Total cholesterol 157, triglycerides 161, HDL 86, LDL 39.  TSH normal at 1.37.  Hb 14.2/HCT 44.2, platelets 211.  BUN 12, creatinine 0.91, EGFR 84,, potassium 3.9.  LFTs normal.   Medications and allergies   Allergies  Allergen Reactions   Codeine Other (See Comments)    Nausea    Infliximab Other (See Comments)   Other Other (See Comments)    Current Outpatient Medications:    acetaminophen (TYLENOL) 500 MG tablet, Take 500-1,000 mg by mouth every 6 (six) hours as needed for mild pain or moderate pain., Disp: , Rfl:    escitalopram (LEXAPRO) 5 MG tablet, Take 5 mg by mouth daily., Disp: , Rfl:     hydrocortisone (ANUSOL-HC) 25 MG suppository, Place 25 mg rectally at bedtime., Disp: , Rfl:    metFORMIN (GLUCOPHAGE) 500 MG tablet, Take 1,000 mg by mouth daily with breakfast., Disp: , Rfl:    nitroGLYCERIN (NITROSTAT) 0.4 MG SL tablet, DISSOLVE 1 TABLET UNDER THE TONGUE EVERY 5 MINUTES AS  NEEDED FOR CHEST PAIN. MAX  OF 3 TABLETS IN 15 MINUTES. CALL 911 IF PAIN PERSISTS., Disp: 100 tablet, Rfl: 3   rosuvastatin (CRESTOR) 5 MG tablet, Take 5 mg by mouth daily., Disp: , Rfl:    traZODone (DESYREL) 50 MG tablet, Take 50-100 mg by mouth at bedtime as needed for sleep., Disp: , Rfl:     Radiology:   No results found.  Cardiac Studies:   Exercise myoview stress 07/08/2018: 1. The patient performed treadmill exercise using Bruce protocol, completing 8:30 minutes. The patient completed an estimated workload of 8.9 METS, reaching 82% of the maximum predicted heart rate. Exercise capacity was normal. Abnormal hemodynamic response was seen- Resting hypertension 162/86 mmHg, without expected increase in blood pressure with peak exercise-166/80 mmHg. Stress symptoms included fatigue. No ischemic changes seen on stress electrocardiogram. 2. The overall quality of the study is good.  Left ventricular cavity is noted to be normal on the rest and stress studies.  Gated SPECT imaging demonstrates hypokinesis of the basal inferior, basal inferolateral, mid inferior and mid inferolateral myocardial wall(s).  The left ventricular ejection fraction was calculated or visually estimated to be 60%.  Moderate sized, medium intensity, predominantly reversible perfusion defect in mid to basal inferior/inferolateral myocardium, suggestive of small infarct with moderate peri infarct ischemia in LCx/PDA territory.  3. High risk study with submaximal stress.  Echocardiogram  07/07/2018: 1. Left ventricle cavity is normal in size. Normal global wall motion. Doppler evidence of grade I (impaired) diastolic dysfunction, elevated  LVEDP/LA pressure. Calculated EF 55%. 2. Mild aortic valve leaflet calcification. Mildly restricted aortic valve leaflets. Trace aortic valve stenosis. Peak gradient 22, mean gradient 10 mmHg, AVA 1.77 cm. No aortic valve regurgitation noted. 3. Trace tricuspid regurgitation. Unable to estimate PA pressure due to absence/minimal TR signal.   Carotid artery duplex 07/16/2021: Duplex suggests  stenosis in the right internal carotid artery (1-15%). Duplex suggests stenosis in the right external carotid artery (<50%).  Duplex suggests stenosis in the left internal carotid artery (1-15%). Duplex suggests stenosis in the left external carotid artery (<50%). There is homogeneous plaque throughout bilateral CCA and heterogeneous plaque in bilateral carotid bulb.  Antegrade right vertebral artery flow. Antegrade left vertebral artery flow. No significant change from 07/07/2018. Follow up when appropriate if clinically indicated.  Abdominal Aortic Duplex  07/07/2018 : No AAA observed. Normal iliac artery velocity. Large fluid filled area below naval measuring 7.3 cm.  Appears to be a simple cyst, not well studied. If clinically indicated, consider general US abdomen. See image.  EKG  EKG 07/28/2021: Normal sinus rhythm at the rate of 65 bpm, left atrialenlargement, otherwise normal EKG.   Assessment     ICD-10-CM   1. Angina pectoris (HCC)  I20.9     2. Coronary artery calcification seen on CT scan  I25.10     3. Essential hypertension  I10     4. Controlled type 2 diabetes mellitus without complication, without long-term current use of insulin (HCC)  E11.9       No orders of the defined types were placed in this encounter.   There are no discontinued medications.   Recommendations:   Benjamin Chapman  is a 72 y.o. psoriatic arthritis, Hypertension, hyperlipidemia, type 2 diabetes, hypertension, ulcerative colitis, and psoriasis who presents for a one year OV for evaluation of angina pectoris,  he has had abnormal stress test 07/08/2018 imaging day moderate size inferolateral wall ischemia considered high risk on a submaximal stress test.  Echocardiogram at that time revealed trace aortic stenosis and mild diastolic dysfunction.  1. Angina pectoris (HCC) ***  2. Coronary artery calcification seen on CT scan ***  3. Essential hypertension ***  4. Controlled type 2 diabetes mellitus without complication, without long-term current use of insulin (HCC) ***   Since being on aggressive medical therapy, he has not had any further episodes of chest pain and feels well.  His blood pressure is well controlled, I reviewed his external labs, renal function is normal and CBC is normal. Lipids under good control with LDL <40 and minimally elevated TG.   Patient has psoriatic arthritis and also has ulcerative colitis, he has had frequent diarrhea.  He was tried on [facility]) with no relief, GI disturbance continue to persist with diarrhea.  I am beginning to wonder whether metformin is causing him to have the frequent bowel movements.  Advised him to hold metformin and to discuss with Dr. Ashby Dawes about this.  She could also try Metamucil or WelChol.  He is EKG is normal, he continues to remain active without any recurrence of chest pain.  As he remains stable and risk factors are well controlled, I will see him back on a as needed basis.  This was a telephone encounter, patient had to wait to see me for 45 minutes hence could not wait.  He accepted my apologies, he is aware that he will contact me if he has recurrence of chest pain.  I reviewed the results of the carotid artery duplex, carotid bruit related to external carotid artery stenosis.  He has mild homogenous plaque in the bilateral ICA, continue statin therapy.  I spent 25 minutes with him over the phone and discussing his symptoms, evaluation of external records, and reassuring his cardiac status.    Adrian Prows, MD,  [facility] 05/17/2022, 3:36 PM Office: [phone] Pager:  [phone]  

## 2022-05-18 ENCOUNTER — Ambulatory Visit: Payer: Medicare HMO | Admitting: Cardiology

## 2022-05-18 ENCOUNTER — Encounter: Payer: Self-pay | Admitting: Cardiology

## 2022-05-18 VITALS — BP 173/76 | HR 58 | Resp 16 | Ht 67.0 in | Wt 195.0 lb

## 2022-05-18 DIAGNOSIS — I358 Other nonrheumatic aortic valve disorders: Secondary | ICD-10-CM

## 2022-05-18 DIAGNOSIS — I209 Angina pectoris, unspecified: Secondary | ICD-10-CM

## 2022-05-18 DIAGNOSIS — I2510 Atherosclerotic heart disease of native coronary artery without angina pectoris: Secondary | ICD-10-CM

## 2022-05-18 DIAGNOSIS — E119 Type 2 diabetes mellitus without complications: Secondary | ICD-10-CM

## 2022-05-18 DIAGNOSIS — I10 Essential (primary) hypertension: Secondary | ICD-10-CM

## 2022-05-18 MED ORDER — AMLODIPINE BESYLATE 10 MG PO TABS: 10.0000 mg | ORAL_TABLET | Freq: Every day | ORAL | 3 refills | Status: DC

## 2022-05-18 MED ORDER — LOSARTAN POTASSIUM 25 MG PO TABS: 25.0000 mg | ORAL_TABLET | Freq: Every evening | ORAL | 2 refills | Status: DC

## 2022-05-21 ENCOUNTER — Other Ambulatory Visit: Payer: Self-pay | Admitting: Cardiology

## 2022-05-21 DIAGNOSIS — I10 Essential (primary) hypertension: Secondary | ICD-10-CM

## 2022-05-26 ENCOUNTER — Other Ambulatory Visit: Payer: Medicare HMO

## 2022-06-03 ENCOUNTER — Ambulatory Visit: Payer: Medicare HMO

## 2022-06-03 DIAGNOSIS — I358 Other nonrheumatic aortic valve disorders: Secondary | ICD-10-CM

## 2022-06-03 DIAGNOSIS — I209 Angina pectoris, unspecified: Secondary | ICD-10-CM

## 2022-06-04 ENCOUNTER — Telehealth: Admit: 2022-06-04 | Discharge: 2022-06-05 | Payer: MEDICARE | Attending: Gastroenterology | Primary: Gastroenterology

## 2022-06-05 DIAGNOSIS — K50819 Crohn's disease of both small and large intestine with unspecified complications: Principal | ICD-10-CM

## 2022-06-18 ENCOUNTER — Ambulatory Visit: Payer: Medicare HMO | Admitting: Cardiology

## 2022-06-18 DIAGNOSIS — K50819 Crohn's disease of both small and large intestine with unspecified complications: Principal | ICD-10-CM

## 2022-06-18 MED ORDER — SKYRIZI 360 MG/2.4 ML (150 MG/ML) SUBCUTANEOUS WEARABLE INJECTOR
2 refills | 0 days | Status: CP
Start: 2022-06-18 — End: ?

## 2022-06-29 ENCOUNTER — Encounter: Admit: 2022-06-29 | Discharge: 2022-06-29 | Payer: MEDICARE | Attending: Anesthesiology | Primary: Anesthesiology

## 2022-06-29 ENCOUNTER — Ambulatory Visit: Admit: 2022-06-29 | Discharge: 2022-06-29 | Payer: MEDICARE

## 2022-07-01 ENCOUNTER — Ambulatory Visit: Payer: Medicare HMO | Admitting: Cardiology

## 2022-07-01 NOTE — Progress Notes (Deleted)
Primary Physician/Referring:  Margretta Sidle, MD  Patient ID: Benjamin Chapman, male    DOB: 12-23-1950, 72 y.o.   MRN: [id number]  No chief complaint on file.  HPI:    Oryn Stanziale  is a 72 y.o. psoriatic arthritis, Hypertension, hyperlipidemia, type 2 diabetes, hypertension, ulcerative colitis, and psoriasis who presents for a one yesr  OV for evaluation of angina pectoris, he has had abnormal stress test 07/08/2018 imaging day moderate size inferolateral wall ischemia considered high risk on a submaximal stress test.  Echocardiogram at that time revealed trace aortic stenosis and mild diastolic dysfunction.  Since being on aggressive medical therapy, he has not had any further episodes of chest pain and feels well.  He did have Covid 19 in Feb 2021. Since last saw him a year ago, patient has had colectomy followed by ileostomy due to recurrent flareup of ulcerative colitis and frequent diarrhea.  Last hospitalization was 05/28/2021 for possible small bowel obstruction and acute renal failure and low blood pressure, discharged home after discontinuing ARB and also amlodipine.  Presently doing well and remains asymptomatic without recurrence of angina pectoris.  Past Medical History:  Diagnosis Date   Bilateral carotid bruits    Diabetes mellitus without complication (HCC)    Dyspnea    Hypertension    Psoriatic arthritis (HCC)    Systolic murmur of aorta    Ulcerative colitis, acute ([place])    Social History   Tobacco Use   Smoking status: Former    Packs/day: 1.00    Years: 20.00    Total pack years: 20.00    Types: Cigarettes    Quit date: 07/15/1998    Years since quitting: 23.9   Smokeless tobacco: Never  Substance Use Topics   Alcohol use: Yes    Comment: occasional   ROS  Review of Systems  Cardiovascular:  Negative for chest pain, dyspnea on exertion and leg swelling.  Gastrointestinal:  Positive for diarrhea.   Objective  There were no vitals taken for this visit.      05/18/2022    9:22 AM 05/18/2022    9:15 AM 04/30/2022    8:01 AM  Vitals with BMI  Height  5' 7"$    Weight  195 lbs   BMI  [id number]   Systolic [id number]  Diastolic 76 76 60  Pulse 58 58 64     Laboratory examination:   Recent Labs    04/28/22 0524 04/29/22 0424 04/30/22 0539  NA 134* 133* 137  K 4.2 4.9 4.5  CL 105 106 113*  CO2 23 19* 19*  GLUCOSE 165* 133* 112*  BUN 23 42* 26*  CREATININE 0.87 2.71* 0.95  CALCIUM 8.5* 8.5* 8.3*  GFRNONAA >60 24* >60        Latest Ref Rng & Units 04/30/2022    5:39 AM 04/29/2022    4:24 AM 04/28/2022    5:24 AM  CMP  Glucose 70 - 99 mg/dL 112  133  165   BUN 8 - 23 mg/dL 26  42  23   Creatinine 0.61 - 1.24 mg/dL 0.95  2.71  0.87   Sodium 135 - 145 mmol/L 137  133  134   Potassium 3.5 - 5.1 mmol/L 4.5  4.9  4.2   Chloride 98 - 111 mmol/L 113  106  105   CO2 22 - 32 mmol/L 19  19  23   $ Calcium 8.9 - 10.3 mg/dL 8.3  8.5  8.5  Latest Ref Rng & Units 04/30/2022    5:39 AM 04/29/2022    4:24 AM 04/28/2022    5:24 AM  CBC  WBC 4.0 - 10.5 K/uL 8.4  8.3  5.3   Hemoglobin 13.0 - 17.0 g/dL 11.8  12.9  14.2   Hematocrit 39.0 - 52.0 % 38.1  42.2  45.7   Platelets 150 - 400 K/uL 275  327  344    Lipid Panel     Component Value Date/Time   CHOL 113 01/11/2019 0929   TRIG 183 (H) 01/11/2019 0929   HDL 44 01/11/2019 0929   LDLCALC 39 01/11/2019 0929   External labs:    Labs 07/23/2021:  Serum glucose 100 mg, BUN 15, creatinine 0.87, EGFR 92, LFTs normal.  Hb 13.2/HCT 41.7, platelets 355.  Total cholesterol 143, triglycerides 169, HDL 70, LDL 46.  CRP 27.  Labs 01/15/2021:  Total cholesterol 157, triglycerides 161, HDL 86, LDL 39.  TSH normal at 1.37.  Hb 14.2/HCT 44.2, platelets 211.  BUN 12, creatinine 0.91, EGFR 84,, potassium 3.9.  LFTs normal.   Medications and allergies   Allergies  Allergen Reactions   Codeine Other (See Comments)    Nausea    Infliximab Other (See Comments)   Other Other  (See Comments)    Current Outpatient Medications:    acetaminophen (TYLENOL) 500 MG tablet, Take 500-1,000 mg by mouth every 6 (six) hours as needed for mild pain or moderate pain., Disp: , Rfl:    amLODipine (NORVASC) 10 MG tablet, Take 1 tablet (10 mg total) by mouth daily., Disp: 90 tablet, Rfl: 3   escitalopram (LEXAPRO) 5 MG tablet, Take 5 mg by mouth daily., Disp: , Rfl:    losartan (COZAAR) 25 MG tablet, TAKE 1 TABLET BY MOUTH EVERY EVENING, Disp: 90 tablet, Rfl: 1   metFORMIN (GLUCOPHAGE) 500 MG tablet, Take 1,000 mg by mouth daily with breakfast., Disp: , Rfl:    nitroGLYCERIN (NITROSTAT) 0.4 MG SL tablet, DISSOLVE 1 TABLET UNDER THE TONGUE EVERY 5 MINUTES AS  NEEDED FOR CHEST PAIN. MAX  OF 3 TABLETS IN 15 MINUTES. CALL 911 IF PAIN PERSISTS., Disp: 100 tablet, Rfl: 3   predniSONE (DELTASONE) 20 MG tablet, Take 20 mg by mouth daily with breakfast. Patient has 5 more days to go, Disp: , Rfl:    rosuvastatin (CRESTOR) 5 MG tablet, Take 5 mg by mouth daily., Disp: , Rfl:    traZODone (DESYREL) 50 MG tablet, Take 50-100 mg by mouth at bedtime as needed for sleep., Disp: , Rfl:     Radiology:   No results found.  Cardiac Studies:   Exercise myoview stress 07/08/2018: 1. The patient performed treadmill exercise using Bruce protocol, completing 8:30 minutes. The patient completed an estimated workload of 8.9 METS, reaching 82% of the maximum predicted heart rate. Exercise capacity was normal. Abnormal hemodynamic response was seen- Resting hypertension 162/86 mmHg, without expected increase in blood pressure with peak exercise-166/80 mmHg. Stress symptoms included fatigue. No ischemic changes seen on stress electrocardiogram. 2. The overall quality of the study is good.  Left ventricular cavity is noted to be normal on the rest and stress studies.  Gated SPECT imaging demonstrates hypokinesis of the basal inferior, basal inferolateral, mid inferior and mid inferolateral myocardial wall(s).   The left ventricular ejection fraction was calculated or visually estimated to be 60%.  Moderate sized, medium intensity, predominantly reversible perfusion defect in mid to basal inferior/inferolateral myocardium, suggestive of small infarct with moderate peri infarct  ischemia in LCx/PDA territory.  3. High risk study with submaximal stress.  Carotid artery duplex 07/16/2021: Duplex suggests stenosis in the right internal carotid artery (1-15%). Duplex suggests stenosis in the right external carotid artery (<50%).  Duplex suggests stenosis in the left internal carotid artery (1-15%). Duplex suggests stenosis in the left external carotid artery (<50%). There is homogeneous plaque throughout bilateral CCA and heterogeneous plaque in bilateral carotid bulb.  Antegrade right vertebral artery flow. Antegrade left vertebral artery flow. No significant change from 07/07/2018. Follow up when appropriate if clinically indicated.  Abdominal Aortic Duplex  07/07/2018 : No AAA observed. Normal iliac artery velocity. Large fluid filled area below naval measuring 7.3 cm.  Appears to be a simple cyst, not well studied. If clinically indicated, consider general US abdomen. See image.  PCV ECHOCARDIOGRAM COMPLETE 06/03/2022  Narrative Echocardiogram 06/03/2022: Left ventricle cavity is normal in size. Mild concentric hypertrophy of the left ventricle. Hyperdynamic global wall motion. Normal diastolic filling pattern. Left atrial cavity is normal in size. Aneurysmal interatrial septum without 2D or color Doppler evidence of shunting. Structurally normal trileaflet aortic valve. Mild aortic stenosis. Vmax 2.6 m/sec, mean PG 12 mmHg, AVA 1.9 cm by continuity equation. No regurgitation. No evidence of pulmonary hypertension. Personally compared with previous study in 2020, which did not show LVH, mean aortic valve PG was 10 mmHg.     EKG  EKG 05/18/2022: Normal sinus rhythm at rate of 55 bpm, normal axis, no  evidence of ischemia, normal EKG.  Compared to 07/28/2021, heart rate was 65 bpm otherwise no change.  Assessment     ICD-10-CM   1. Angina pectoris (HCC)  I20.9     2. Abnormal nuclear stress test  R94.39     3. Primary hypertension  I10     4. Pure hypercholesterolemia  E78.00       No orders of the defined types were placed in this encounter.   There are no discontinued medications.    Recommendations:   Faizaan Mabra  is a 72 y.o. psoriatic arthritis, Hypertension, hyperlipidemia, type 2 diabetes, hypertension, ulcerative colitis S/P Colostomy (June 2023) , and psoriasis who presents for a one year OV for evaluation of angina pectoris, he has had abnormal stress test 07/08/2018 imaging day moderate size inferolateral wall ischemia considered high risk on a submaximal stress test.  Echocardiogram at that time revealed trace aortic stenosis and mild diastolic dysfunction.  Since last saw him a year ago, patient has had colectomy followed by ileostomy due to recurrent flareup of ulcerative colitis and frequent diarrhea.  Last hospitalization was 05/28/2021 for possible small bowel obstruction and acute renal failure and low blood pressure, discharged home after discontinuing ARB and also amlodipine.  1. Angina pectoris ([place]) Patient in spite of abnormal nuclear stress test and high risk medical issues including diabetes mellitus, hypertension hyperlipidemia, underwent extensive surgery with colectomy and placement of ileostomy without periprocedural cardiac complications.  He has not had any recurrence of angina pectoris.  2. Coronary artery calcification seen on CT scan He has significant coronary artery calcification noted on the CT scan, his symptoms of exertional chest pain are also consistent with angina pectoris, he probably has significant coronary disease but is doing well on medical therapy, continue the same.  He is also on high intensity statins, lipids under excellent  control.  3. Essential hypertension Since recent hospitalization, he had ileostomy placed and had a 2-week hospital stay complicated by long recovery.  He is antihypertensive medications and  also antianginal medications have been discontinued due to low blood pressure and also renal failure.  I have reinitiated losartan 25 mg daily, previously on lisinopril 40 mg daily, will restart amlodipine 10 mg daily.  Will hold off on beta-blocker for now.  4.  Aortic systolic murmur on examination He has aortic stenotic murmur, there is well-appreciated, will obtain echocardiogram as it has been 3 years ago that he has had prior echocardiogram.  I would like to see him back in 4 weeks for follow-up of specifically hypertension and also will follow-up on echocardiogram.  This was a 40-minute office visit encounter with review of external records, external labs and reconciliation of medications.     Adrian Prows, MD, [facility] 07/01/2022, 7:54 AM Office: [phone] Pager: [phone]

## 2022-07-03 ENCOUNTER — Ambulatory Visit: Admit: 2022-07-03 | Discharge: 2022-07-04 | Payer: MEDICARE

## 2022-07-03 DIAGNOSIS — K50819 Crohn's disease of both small and large intestine with unspecified complications: Principal | ICD-10-CM

## 2022-07-09 ENCOUNTER — Ambulatory Visit: Payer: Medicare HMO | Admitting: Cardiology

## 2022-07-21 ENCOUNTER — Encounter: Payer: Self-pay | Admitting: Cardiology

## 2022-07-21 ENCOUNTER — Ambulatory Visit: Payer: Medicare HMO | Admitting: Cardiology

## 2022-07-21 VITALS — BP 164/81 | HR 71 | Ht 67.0 in | Wt 198.0 lb

## 2022-07-21 DIAGNOSIS — I350 Nonrheumatic aortic (valve) stenosis: Secondary | ICD-10-CM

## 2022-07-21 DIAGNOSIS — I209 Angina pectoris, unspecified: Secondary | ICD-10-CM

## 2022-07-21 DIAGNOSIS — E7800 Pure hypercholesterolemia, unspecified: Secondary | ICD-10-CM

## 2022-07-21 DIAGNOSIS — I10 Essential (primary) hypertension: Secondary | ICD-10-CM

## 2022-07-21 MED ORDER — LOSARTAN POTASSIUM 50 MG PO TABS: 50.0000 mg | ORAL_TABLET | Freq: Every day | ORAL | 1 refills | Status: DC

## 2022-07-21 MED ORDER — METOPROLOL SUCCINATE ER 25 MG PO TB24: 25.0000 mg | ORAL_TABLET | Freq: Every day | ORAL | 2 refills | Status: DC

## 2022-07-21 NOTE — Progress Notes (Signed)
Primary Physician/Referring:  Margretta Sidle, MD  Patient ID: Benjamin Chapman, male    DOB: 06/18/1950, 72 y.o.   MRN: [id number]  Chief Complaint  Patient presents with   Angina pectoris [facility])   HPI:    [name]  is a 72 y.o. psoriatic arthritis, Hypertension, hyperlipidemia, type 2 diabetes, hypertension, ulcerative colitis, and psoriasis who presents for a one yesr  OV for evaluation of angina pectoris, he has had abnormal stress test 07/08/2018 imaging day moderate size inferolateral wall ischemia considered high risk on a submaximal stress test.  Echocardiogram at that time revealed trace aortic stenosis and mild diastolic dysfunction.  Since being on aggressive medical therapy, he has not had any further episodes of chest pain and feels well.  Essentially asymptomatic. Past Medical History:  Diagnosis Date   Bilateral carotid bruits    Diabetes mellitus without complication (HCC)    Dyspnea    Hypertension    Psoriatic arthritis (HCC)    Systolic murmur of aorta    Ulcerative colitis, acute ([place])    Social History   Tobacco Use   Smoking status: Former    Packs/day: 1.00    Years: 20.00    Total pack years: 20.00    Types: Cigarettes    Quit date: 07/15/1998    Years since quitting: 24.0   Smokeless tobacco: Never  Substance Use Topics   Alcohol use: Yes    Comment: occasional   ROS  Review of Systems  Cardiovascular:  Negative for chest pain, dyspnea on exertion and leg swelling.  Gastrointestinal:  Positive for diarrhea.   Objective  Blood pressure (!) 164/81, pulse 71, height '5\' 7"'$  (1.702 m), weight 198 lb (89.8 kg), SpO2 97 %.     07/21/2022    9:51 AM 05/18/2022    9:22 AM 05/18/2022    9:15 AM  Vitals with BMI  Height '5\' 7"'$   '5\' 7"'$   Weight 198 lbs  195 lbs  BMI 31  [id number]  Systolic [id number]  Diastolic 81 76 76  Pulse 71 58 58     Laboratory examination:   Recent Labs    04/28/22 0524 04/29/22 0424 04/30/22 0539  NA 134* 133* 137  K 4.2  4.9 4.5  CL 105 106 113*  CO2 23 19* 19*  GLUCOSE 165* 133* 112*  BUN 23 42* 26*  CREATININE 0.87 2.71* 0.95  CALCIUM 8.5* 8.5* 8.3*  GFRNONAA >60 24* >60       Latest Ref Rng & Units 04/30/2022    5:39 AM 04/29/2022    4:24 AM 04/28/2022    5:24 AM  CMP  Glucose 70 - 99 mg/dL 112  133  165   BUN 8 - 23 mg/dL 26  42  23   Creatinine 0.61 - 1.24 mg/dL 0.95  2.71  0.87   Sodium 135 - 145 mmol/L 137  133  134   Potassium 3.5 - 5.1 mmol/L 4.5  4.9  4.2   Chloride 98 - 111 mmol/L 113  106  105   CO2 22 - 32 mmol/L '19  19  23   '$ Calcium 8.9 - 10.3 mg/dL 8.3  8.5  8.5       Latest Ref Rng & Units 04/30/2022    5:39 AM 04/29/2022    4:24 AM 04/28/2022    5:24 AM  CBC  WBC 4.0 - 10.5 K/uL 8.4  8.3  5.3   Hemoglobin 13.0 - 17.0 g/dL 11.8  12.9  14.2   Hematocrit 39.0 - 52.0 % 38.1  42.2  45.7   Platelets 150 - 400 K/uL 275  327  344    Lipid Panel     Component Value Date/Time   CHOL 113 01/11/2019 0929   TRIG 183 (H) 01/11/2019 0929   HDL 44 01/11/2019 0929   LDLCALC 39 01/11/2019 0929   External labs:   Labs 05/13/2022:  Serum glucose 99 mg, BUN 20, creatinine 0.93, EGFR 87 mL, sodium 137, potassium 4.8.  Labs 07/23/2021:  Serum glucose 100 mg, BUN 15, creatinine 0.87, EGFR 92, LFTs normal.  Hb 13.2/HCT 41.7, platelets 355.  Total cholesterol 143, triglycerides 169, HDL 70, LDL 46.  CRP 27.  Labs 01/15/2021:  Total cholesterol 157, triglycerides 161, HDL 86, LDL 39.  TSH normal at 1.37.  Hb 14.2/HCT 44.2, platelets 211.  BUN 12, creatinine 0.91, EGFR 84,, potassium 3.9.  LFTs normal.   Medications and allergies   Allergies  Allergen Reactions   Codeine Other (See Comments)    Nausea   Escitalopram     Other reaction(s): fatigue, diarrhea, ED, Crohn's flareup   Infliximab Other (See Comments)   Other Other (See Comments)   Sertraline Nausea And Vomiting    Current Outpatient Medications:    acetaminophen (TYLENOL) 500 MG tablet, Take 500-1,000 mg  by mouth every 6 (six) hours as needed for mild pain or moderate pain., Disp: , Rfl:    amLODipine (NORVASC) 10 MG tablet, Take 1 tablet (10 mg total) by mouth daily., Disp: 90 tablet, Rfl: 3   escitalopram (LEXAPRO) 5 MG tablet, Take 5 mg by mouth daily., Disp: , Rfl:    metFORMIN (GLUCOPHAGE) 500 MG tablet, Take 1,000 mg by mouth daily with breakfast., Disp: , Rfl:    metoprolol succinate (TOPROL-XL) 25 MG 24 hr tablet, Take 1 tablet (25 mg total) by mouth daily. Take with or immediately following a meal., Disp: 30 tablet, Rfl: 2   nitroGLYCERIN (NITROSTAT) 0.4 MG SL tablet, DISSOLVE 1 TABLET UNDER THE TONGUE EVERY 5 MINUTES AS  NEEDED FOR CHEST PAIN. MAX  OF 3 TABLETS IN 15 MINUTES. CALL 911 IF PAIN PERSISTS., Disp: 100 tablet, Rfl: 3   rosuvastatin (CRESTOR) 5 MG tablet, Take 5 mg by mouth daily., Disp: , Rfl:    traZODone (DESYREL) 50 MG tablet, Take 50-100 mg by mouth at bedtime as needed for sleep., Disp: , Rfl:    losartan (COZAAR) 50 MG tablet, Take 1 tablet (50 mg total) by mouth daily., Disp: 90 tablet, Rfl: 1    Radiology:   No results found.  Cardiac Studies:   Exercise myoview stress 07/08/2018: 1. The patient performed treadmill exercise using Bruce protocol, completing 8:30 minutes. The patient completed an estimated workload of 8.9 METS, reaching 82% of the maximum predicted heart rate. Exercise capacity was normal. Abnormal hemodynamic response was seen- Resting hypertension 162/86 mmHg, without expected increase in blood pressure with peak exercise-166/80 mmHg. Stress symptoms included fatigue. No ischemic changes seen on stress electrocardiogram. 2. The overall quality of the study is good.  Left ventricular cavity is noted to be normal on the rest and stress studies.  Gated SPECT imaging demonstrates hypokinesis of the basal inferior, basal inferolateral, mid inferior and mid inferolateral myocardial wall(s).  The left ventricular ejection fraction was calculated or visually  estimated to be 60%.  Moderate sized, medium intensity, predominantly reversible perfusion defect in mid to basal inferior/inferolateral myocardium, suggestive of small infarct with moderate peri infarct ischemia in LCx/PDA  territory.  3. High risk study with submaximal stress.  Carotid artery duplex 07/16/2021: Duplex suggests stenosis in the right internal carotid artery (1-15%). Duplex suggests stenosis in the right external carotid artery (<50%).  Duplex suggests stenosis in the left internal carotid artery (1-15%). Duplex suggests stenosis in the left external carotid artery (<50%). There is homogeneous plaque throughout bilateral CCA and heterogeneous plaque in bilateral carotid bulb.  Antegrade right vertebral artery flow. Antegrade left vertebral artery flow. No significant change from 07/07/2018. Follow up when appropriate if clinically indicated.  Abdominal Aortic Duplex  07/07/2018 : No AAA observed. Normal iliac artery velocity. Large fluid filled area below naval measuring 7.3 cm.  Appears to be a simple cyst, not well studied. If clinically indicated, consider general US abdomen. See image.  PCV ECHOCARDIOGRAM COMPLETE 06/03/2022  Narrative Echocardiogram 06/03/2022: Left ventricle cavity is normal in size. Mild concentric hypertrophy of the left ventricle. Hyperdynamic global wall motion. Normal diastolic filling pattern. Left atrial cavity is normal in size. Aneurysmal interatrial septum without 2D or color Doppler evidence of shunting. Structurally normal trileaflet aortic valve. Mild aortic stenosis. Vmax 2.6 m/sec, mean PG 12 mmHg, AVA 1.9 cm by continuity equation. No regurgitation. No evidence of pulmonary hypertension. Personally compared with previous study in 2020, which did not show LVH, mean aortic valve PG was 10 mmHg.     EKG  EKG 05/18/2022: Normal sinus rhythm at rate of 55 bpm, normal axis, no evidence of ischemia, normal EKG.  Compared to 07/28/2021, heart rate  was 65 bpm otherwise no change.  Assessment     ICD-10-CM   1. Angina pectoris (HCC)  I20.9 metoprolol succinate (TOPROL-XL) 25 MG 24 hr tablet    2. Essential hypertension  I10 losartan (COZAAR) 50 MG tablet    Basic metabolic panel    3. Mild aortic stenosis  I35.0     4. Pure hypercholesterolemia  E78.00 Lipid Panel With LDL/HDL Ratio    Lipoprotein A (LPA)      Meds ordered this encounter  Medications   losartan (COZAAR) 50 MG tablet    Sig: Take 1 tablet (50 mg total) by mouth daily.    Dispense:  90 tablet    Refill:  1   metoprolol succinate (TOPROL-XL) 25 MG 24 hr tablet    Sig: Take 1 tablet (25 mg total) by mouth daily. Take with or immediately following a meal.    Dispense:  30 tablet    Refill:  2    Medications Discontinued During This Encounter  Medication Reason   predniSONE (DELTASONE) 20 MG tablet Patient Preference   losartan (COZAAR) 25 MG tablet Reorder     Recommendations:   Laguan Bado  is a 72 y.o. psoriatic arthritis, Hypertension, hyperlipidemia, type 2 diabetes, hypertension, mild aortic stenosis, ulcerative colitis S/P Colostomy (June 2023) , and psoriasis who presents for , he has had abnormal stress test 07/08/2018 imaging day moderate size inferolateral wall ischemia considered high risk on a submaximal stress test.    1. Angina pectoris ([place]) Patient has not had any recurrence of angina pectoris since being on aggressive medical therapy.  2. Essential hypertension   I am reinitiating all his antihypertensive medications back. Blood pressure still remains elevated, will increase losartan to 50 mg daily, I will also restart metoprolol succinate 25 mg daily.  He will continue with amlodipine.  I will obtain BMP in 2 weeks.  3. Mild aortic stenosis Reviewed the results of the echocardiogram that was performed in January  2024, he has very mild aortic stenosis.  Overall stable.  4. Pure hypercholesterolemia He needs lipid profile testing, I  will also check LPA with his usual lipid profile testing.  Overall he has been doing well without recurrence of angina, advised him to increase cardio exercises.     Adrian Prows, MD, [facility] 07/21/2022, 10:10 AM Office: [phone] Pager: [phone]

## 2022-08-03 ENCOUNTER — Ambulatory Visit: Admit: 2022-08-03 | Discharge: 2022-08-04 | Payer: MEDICARE

## 2022-08-08 LAB — LIPID PANEL WITH LDL/HDL RATIO
Cholesterol, Total: 168 mg/dL (ref 100–199)
HDL: 67 mg/dL (ref >39)
LDL Chol Calc (NIH): 44 mg/dL (ref 0–99)
LDL/HDL Ratio: 0.7 ratio (ref 0.0–3.6)
Triglycerides: 391 mg/dL — ABNORMAL HIGH (ref 0–149)
VLDL Cholesterol Cal: 57 mg/dL — ABNORMAL HIGH (ref 5–40)

## 2022-08-08 LAB — LIPOPROTEIN A (LPA): Lipoprotein (a): <8.4 nmol/L (ref <75.0)

## 2022-08-08 LAB — BASIC METABOLIC PANEL
BUN/Creatinine Ratio: 23 (ref 10–24)
BUN: 19 mg/dL (ref 8–27)
CO2: 22 mmol/L (ref 20–29)
Calcium: 10.3 mg/dL — ABNORMAL HIGH (ref 8.6–10.2)
Chloride: 99 mmol/L (ref 96–106)
Creatinine, Ser: 0.82 mg/dL (ref 0.76–1.27)
Glucose: 139 mg/dL — ABNORMAL HIGH (ref 70–99)
Potassium: 4 mmol/L (ref 3.5–5.2)
Sodium: 136 mmol/L (ref 134–144)
eGFR: 92 mL/min/{1.73_m2} (ref >59)

## 2022-08-08 NOTE — Progress Notes (Signed)
Lipoprotein (a)  <75.0 nmol/L                     <8.4  I will discuss lipids on his office visit soon.

## 2022-08-21 ENCOUNTER — Other Ambulatory Visit: Payer: Self-pay

## 2022-08-21 DIAGNOSIS — I209 Angina pectoris, unspecified: Secondary | ICD-10-CM

## 2022-08-21 MED ORDER — METOPROLOL SUCCINATE ER 25 MG PO TB24: 25.0000 mg | ORAL_TABLET | Freq: Every day | ORAL | 2 refills | Status: DC

## 2022-08-25 ENCOUNTER — Other Ambulatory Visit: Payer: Self-pay

## 2022-08-25 DIAGNOSIS — I10 Essential (primary) hypertension: Secondary | ICD-10-CM

## 2022-08-25 MED ORDER — AMLODIPINE BESYLATE 10 MG PO TABS
10.0000 mg | ORAL_TABLET | Freq: Every day | ORAL | 3 refills | Status: DC
Start: 2022-08-25 — End: 2023-08-26

## 2022-08-25 MED ORDER — LOSARTAN POTASSIUM 50 MG PO TABS
50.0000 mg | ORAL_TABLET | Freq: Every day | ORAL | 3 refills | Status: DC
Start: 2022-08-25 — End: 2023-01-21

## 2022-08-25 MED ORDER — ROSUVASTATIN CALCIUM 5 MG PO TABS: 5.0000 mg | ORAL_TABLET | Freq: Every day | ORAL | 3 refills | Status: AC

## 2022-08-31 ENCOUNTER — Ambulatory Visit: Admit: 2022-08-31 | Discharge: 2022-09-01 | Payer: MEDICARE

## 2022-09-01 ENCOUNTER — Ambulatory Visit: Payer: Medicare HMO | Admitting: Cardiology

## 2022-09-02 MED ORDER — NYSTATIN 100,000 UNIT/GRAM TOPICAL POWDER
Freq: Every day | TOPICAL | 0 refills | 30 days | Status: CP
Start: 2022-09-02 — End: 2023-09-02

## 2022-09-03 ENCOUNTER — Other Ambulatory Visit: Payer: Self-pay | Admitting: Adult Health

## 2022-09-03 ENCOUNTER — Ambulatory Visit
Admission: RE | Admit: 2022-09-03 | Discharge: 2022-09-03 | Disposition: A | Discharge status: Home / Self Care | Payer: Medicare HMO | Source: Ambulatory Visit | Attending: Adult Health | Admitting: Adult Health

## 2022-09-03 DIAGNOSIS — M533 Sacrococcygeal disorders, not elsewhere classified: Secondary | ICD-10-CM

## 2022-09-07 ENCOUNTER — Institutional Professional Consult (permissible substitution): Admit: 2022-09-07 | Discharge: 2022-09-08 | Payer: MEDICARE

## 2022-09-15 MED ORDER — NYSTATIN 100,000 UNIT/GRAM TOPICAL POWDER
Freq: Every day | TOPICAL | 0 refills | 30 days | Status: CP
Start: 2022-09-15 — End: 2023-09-15

## 2022-09-29 DIAGNOSIS — K50819 Crohn's disease of both small and large intestine with unspecified complications: Principal | ICD-10-CM

## 2022-09-29 MED ORDER — SKYRIZI 360 MG/2.4 ML (150 MG/ML) SUBCUTANEOUS WEARABLE INJECTOR
SUBCUTANEOUS | 2 refills | 0.00000 days | Status: CP
Start: 2022-09-29 — End: 2022-09-29

## 2022-10-16 ENCOUNTER — Other Ambulatory Visit: Payer: Self-pay | Admitting: Cardiology

## 2022-10-16 DIAGNOSIS — I209 Angina pectoris, unspecified: Secondary | ICD-10-CM

## 2022-10-22 ENCOUNTER — Ambulatory Visit: Admit: 2022-10-22 | Discharge: 2022-10-23 | Payer: MEDICARE | Attending: Gastroenterology | Primary: Gastroenterology

## 2022-10-22 DIAGNOSIS — K6289 Other specified diseases of anus and rectum: Principal | ICD-10-CM

## 2022-10-22 DIAGNOSIS — L409 Psoriasis, unspecified: Principal | ICD-10-CM

## 2022-10-22 DIAGNOSIS — K50819 Crohn's disease of both small and large intestine with unspecified complications: Principal | ICD-10-CM

## 2022-10-22 MED ORDER — METHOTREXATE INJECTION (CONTAINS PRESERVATIVE) 25 MG/ML (NON-ONCOLOGY)
SUBCUTANEOUS | 2 refills | 0 days | Status: CP
Start: 2022-10-22 — End: ?

## 2022-10-22 MED ORDER — FOLIC ACID 1 MG TABLET
ORAL_TABLET | Freq: Every day | ORAL | 11 refills | 30 days | Status: CP
Start: 2022-10-22 — End: 2023-10-22

## 2022-10-27 ENCOUNTER — Ambulatory Visit: Admit: 2022-10-27 | Discharge: 2022-10-27 | Payer: MEDICARE

## 2022-10-27 DIAGNOSIS — K50819 Crohn's disease of both small and large intestine with unspecified complications: Principal | ICD-10-CM

## 2022-12-03 ENCOUNTER — Ambulatory Visit: Admit: 2022-12-03 | Discharge: 2022-12-04 | Payer: MEDICARE

## 2022-12-03 DIAGNOSIS — K50819 Crohn's disease of both small and large intestine with unspecified complications: Principal | ICD-10-CM

## 2022-12-03 DIAGNOSIS — K6289 Other specified diseases of anus and rectum: Principal | ICD-10-CM

## 2022-12-07 DIAGNOSIS — K50819 Crohn's disease of both small and large intestine with unspecified complications: Principal | ICD-10-CM

## 2022-12-24 ENCOUNTER — Ambulatory Visit: Admit: 2022-12-24 | Discharge: 2022-12-24 | Payer: MEDICARE

## 2022-12-24 ENCOUNTER — Telehealth
Admit: 2022-12-24 | Discharge: 2022-12-24 | Payer: MEDICARE | Attending: Student in an Organized Health Care Education/Training Program | Primary: Student in an Organized Health Care Education/Training Program

## 2023-01-15 MED ORDER — METHOTREXATE SODIUM (CONTAINS PRESERVATIVES) 25 MG/ML INJECTION SOLUTION
0 refills | 0 days
Start: 2023-01-15 — End: ?

## 2023-01-18 ENCOUNTER — Ambulatory Visit: Admit: 2023-01-18 | Discharge: 2023-01-19 | Payer: MEDICARE

## 2023-01-18 DIAGNOSIS — K50819 Crohn's disease of both small and large intestine with unspecified complications: Principal | ICD-10-CM

## 2023-01-18 MED ORDER — METHOTREXATE INJECTION (CONTAINS PRESERVATIVE) 25 MG/ML (NON-ONCOLOGY)
SUBCUTANEOUS | 2 refills | 0 days | Status: CP
Start: 2023-01-18 — End: ?

## 2023-01-21 ENCOUNTER — Other Ambulatory Visit: Payer: Self-pay | Admitting: Cardiology

## 2023-01-21 DIAGNOSIS — I10 Essential (primary) hypertension: Secondary | ICD-10-CM

## 2023-02-27 MED ORDER — METHOTREXATE INJECTION (CONTAINS PRESERVATIVE) 25 MG/ML (NON-ONCOLOGY)
SUBCUTANEOUS | 2 refills | 0 days
Start: 2023-02-27 — End: ?

## 2023-03-01 MED ORDER — METHOTREXATE INJECTION (CONTAINS PRESERVATIVE) 25 MG/ML (NON-ONCOLOGY)
SUBCUTANEOUS | 1 refills | 0 days | Status: CP
Start: 2023-03-01 — End: ?

## 2023-03-04 ENCOUNTER — Ambulatory Visit
Admit: 2023-03-04 | Discharge: 2023-03-05 | Payer: MEDICARE | Attending: Student in an Organized Health Care Education/Training Program | Primary: Student in an Organized Health Care Education/Training Program

## 2023-03-04 DIAGNOSIS — R21 Rash and other nonspecific skin eruption: Principal | ICD-10-CM

## 2023-03-04 DIAGNOSIS — K50819 Crohn's disease of both small and large intestine with unspecified complications: Principal | ICD-10-CM

## 2023-03-04 DIAGNOSIS — D492 Neoplasm of unspecified behavior of bone, soft tissue, and skin: Principal | ICD-10-CM

## 2023-03-04 MED ORDER — CLOBETASOL 0.05 % TOPICAL OINTMENT
OPHTHALMIC | 1 refills | 0.00000 days | Status: CP
Start: 2023-03-04 — End: ?

## 2023-03-04 MED ORDER — TRIAMCINOLONE ACETONIDE 0.1 % TOPICAL OINTMENT
INTRAMUSCULAR | 1 refills | 0.00000 days | Status: CP
Start: 2023-03-04 — End: ?

## 2023-03-04 MED ORDER — HYDROCORTISONE 2.5 % TOPICAL OINTMENT
2 refills | 0.00000 days | Status: CP
Start: 2023-03-04 — End: ?

## 2023-03-23 ENCOUNTER — Encounter
Admit: 2023-03-23 | Discharge: 2023-03-26 | Disposition: A | Discharge status: Home / Self Care | Payer: MEDICARE | Attending: Student in an Organized Health Care Education/Training Program

## 2023-03-23 ENCOUNTER — Ambulatory Visit: Admit: 2023-03-23 | Discharge: 2023-03-26 | Disposition: A | Discharge status: Home / Self Care | Payer: MEDICARE

## 2023-03-26 MED ORDER — CIPROFLOXACIN 500 MG TABLET
ORAL_TABLET | Freq: Two times a day (BID) | ORAL | 0 refills | 7.00000 days | Status: CP
Start: 2023-03-26 — End: 2023-04-02
  Filled 2023-03-26: qty 14, 7d supply, fill #0

## 2023-03-26 MED ORDER — METRONIDAZOLE 500 MG TABLET
ORAL_TABLET | Freq: Three times a day (TID) | ORAL | 0 refills | 7.00000 days | Status: CP
Start: 2023-03-26 — End: 2023-04-02
  Filled 2023-03-26: qty 21, 7d supply, fill #0

## 2023-03-26 MED ORDER — OXYCODONE 5 MG TABLET
ORAL_TABLET | Freq: Four times a day (QID) | ORAL | 0 refills | 2.00000 days | Status: CP | PRN
Start: 2023-03-26 — End: ?
  Filled 2023-03-26: qty 8, 2d supply, fill #0

## 2023-03-27 MED ORDER — ENOXAPARIN 40 MG/0.4 ML SUBCUTANEOUS SYRINGE
SUBCUTANEOUS | 0 refills | 24.00000 days | Status: CP
Start: 2023-03-27 — End: 2023-04-20
  Filled 2023-03-26: qty 9.6, 24d supply, fill #0

## 2023-04-01 ENCOUNTER — Ambulatory Visit: Admit: 2023-04-01 | Discharge: 2023-04-02 | Payer: MEDICARE

## 2023-04-01 DIAGNOSIS — K50819 Crohn's disease of both small and large intestine with unspecified complications: Principal | ICD-10-CM

## 2023-04-01 MED ORDER — TAMSULOSIN 0.4 MG CAPSULE
ORAL_CAPSULE | Freq: Every day | ORAL | 3 refills | 90 days | Status: CP
Start: 2023-04-01 — End: 2024-03-31

## 2023-04-15 MED ORDER — METHOTREXATE INJECTION (CONTAINS PRESERVATIVE) 25 MG/ML (NON-ONCOLOGY)
SUBCUTANEOUS | 2 refills | 0 days
Start: 2023-04-15 — End: ?

## 2023-04-22 ENCOUNTER — Ambulatory Visit: Admit: 2023-04-22 | Discharge: 2023-04-23 | Payer: MEDICARE

## 2023-04-22 DIAGNOSIS — K50819 Crohn's disease of both small and large intestine with unspecified complications: Principal | ICD-10-CM

## 2023-04-26 MED ORDER — METHOTREXATE INJECTION (CONTAINS PRESERVATIVE) 25 MG/ML (NON-ONCOLOGY)
SUBCUTANEOUS | 2 refills | 0.00 days | Status: CP
Start: 2023-04-26 — End: ?

## 2023-05-10 DIAGNOSIS — K50819 Crohn's disease of both small and large intestine with unspecified complications: Principal | ICD-10-CM

## 2023-05-10 MED ORDER — SKYRIZI 360 MG/2.4 ML (150 MG/ML) SUBCUTANEOUS WEARABLE INJECTOR
2 refills | 0.00 days
Start: 2023-05-10 — End: ?

## 2023-05-11 MED ORDER — SKYRIZI 360 MG/2.4 ML (150 MG/ML) SUBCUTANEOUS WEARABLE INJECTOR
2 refills | 0.00 days | Status: CP
Start: 2023-05-11 — End: ?

## 2023-05-27 ENCOUNTER — Ambulatory Visit
Admit: 2023-05-27 | Discharge: 2023-05-28 | Payer: MEDICARE | Attending: Student in an Organized Health Care Education/Training Program | Primary: Student in an Organized Health Care Education/Training Program

## 2023-05-27 DIAGNOSIS — R21 Rash and other nonspecific skin eruption: Principal | ICD-10-CM

## 2023-05-27 DIAGNOSIS — Z79899 Other long term (current) drug therapy: Principal | ICD-10-CM

## 2023-05-27 DIAGNOSIS — L299 Pruritus, unspecified: Principal | ICD-10-CM

## 2023-06-07 DIAGNOSIS — K50819 Crohn's disease of both small and large intestine with unspecified complications: Principal | ICD-10-CM

## 2023-06-18 DIAGNOSIS — R21 Rash and other nonspecific skin eruption: Principal | ICD-10-CM

## 2023-06-18 MED ORDER — TRIAMCINOLONE ACETONIDE 0.1 % TOPICAL OINTMENT
1 refills | 0.00 days | Status: CP
Start: 2023-06-18 — End: ?

## 2023-06-25 DIAGNOSIS — R21 Rash and other nonspecific skin eruption: Principal | ICD-10-CM

## 2023-06-25 MED ORDER — TRIAMCINOLONE ACETONIDE 0.1 % TOPICAL OINTMENT
1 refills | 0.00 days
Start: 2023-06-25 — End: ?

## 2023-07-29 ENCOUNTER — Ambulatory Visit
Admit: 2023-07-29 | Discharge: 2023-07-30 | Payer: MEDICARE | Attending: Student in an Organized Health Care Education/Training Program | Primary: Student in an Organized Health Care Education/Training Program

## 2023-07-29 DIAGNOSIS — R21 Rash and other nonspecific skin eruption: Principal | ICD-10-CM

## 2023-07-29 DIAGNOSIS — Z85820 Personal history of malignant melanoma of skin: Principal | ICD-10-CM

## 2023-07-29 DIAGNOSIS — Z79899 Other long term (current) drug therapy: Principal | ICD-10-CM

## 2023-07-29 MED ORDER — TRIAMCINOLONE ACETONIDE 0.1 % TOPICAL OINTMENT
1 refills | 0.00 days | Status: CP
Start: 2023-07-29 — End: ?

## 2023-08-09 MED ORDER — METHOTREXATE INJECTION (CONTAINS PRESERVATIVE) 25 MG/ML (NON-ONCOLOGY)
SUBCUTANEOUS | 2 refills | 0 days
Start: 2023-08-09 — End: ?

## 2023-08-10 MED ORDER — METHOTREXATE INJECTION (CONTAINS PRESERVATIVE) 25 MG/ML (NON-ONCOLOGY)
SUBCUTANEOUS | 0 refills | 0 days | Status: CP
Start: 2023-08-10 — End: ?

## 2023-08-26 ENCOUNTER — Other Ambulatory Visit: Payer: Self-pay | Admitting: Cardiology

## 2023-08-26 DIAGNOSIS — I10 Essential (primary) hypertension: Secondary | ICD-10-CM

## 2023-08-26 DIAGNOSIS — I209 Angina pectoris, unspecified: Secondary | ICD-10-CM

## 2023-08-30 DIAGNOSIS — K50819 Crohn's disease of both small and large intestine with unspecified complications: Principal | ICD-10-CM

## 2023-09-01 ENCOUNTER — Other Ambulatory Visit: Payer: Self-pay

## 2023-09-01 DIAGNOSIS — I10 Essential (primary) hypertension: Secondary | ICD-10-CM

## 2023-09-01 MED ORDER — LOSARTAN POTASSIUM 50 MG PO TABS
50.0000 mg | ORAL_TABLET | Freq: Every day | ORAL | 0 refills | Status: DC
Start: 2023-09-01 — End: 2023-09-22

## 2023-09-22 ENCOUNTER — Other Ambulatory Visit: Payer: Self-pay | Admitting: Cardiology

## 2023-09-22 DIAGNOSIS — I209 Angina pectoris, unspecified: Secondary | ICD-10-CM

## 2023-09-22 DIAGNOSIS — I10 Essential (primary) hypertension: Secondary | ICD-10-CM

## 2023-10-11 ENCOUNTER — Ambulatory Visit: Admit: 2023-10-11 | Discharge: 2023-10-12 | Payer: Medicare (Managed Care)

## 2023-10-11 DIAGNOSIS — K50819 Crohn's disease of both small and large intestine with unspecified complications: Principal | ICD-10-CM

## 2023-10-11 DIAGNOSIS — Z23 Encounter for immunization: Principal | ICD-10-CM

## 2023-10-11 DIAGNOSIS — E119 Type 2 diabetes mellitus without complications: Principal | ICD-10-CM

## 2023-10-11 MED ORDER — METRONIDAZOLE 500 MG TABLET
ORAL_TABLET | Freq: Three times a day (TID) | ORAL | 0 refills | 14.00000 days | Status: CP
Start: 2023-10-11 — End: 2023-10-25

## 2023-10-11 MED ORDER — CIPROFLOXACIN 500 MG TABLET
ORAL_TABLET | Freq: Two times a day (BID) | ORAL | 0 refills | 14.00000 days | Status: CP
Start: 2023-10-11 — End: 2023-10-25

## 2023-10-12 MED ORDER — SYRINGE (DISPOSABLE) 3 ML
3 refills | 0.00000 days | Status: CP
Start: 2023-10-12 — End: ?

## 2023-10-12 MED ORDER — METHOTREXATE INJECTION (CONTAINS PRESERVATIVE) 25 MG/ML (NON-ONCOLOGY)
SUBCUTANEOUS | 0 refills | 0.00000 days | Status: CP
Start: 2023-10-12 — End: ?

## 2023-10-14 DIAGNOSIS — Z79899 Other long term (current) drug therapy: Principal | ICD-10-CM

## 2023-10-14 DIAGNOSIS — K50819 Crohn's disease of both small and large intestine with unspecified complications: Principal | ICD-10-CM

## 2023-10-18 ENCOUNTER — Ambulatory Visit: Admit: 2023-10-18 | Discharge: 2023-10-19 | Payer: Medicare (Managed Care)

## 2023-10-18 DIAGNOSIS — K50819 Crohn's disease of both small and large intestine with unspecified complications: Principal | ICD-10-CM

## 2023-10-25 DIAGNOSIS — Z79899 Other long term (current) drug therapy: Principal | ICD-10-CM

## 2023-10-25 DIAGNOSIS — K50819 Crohn's disease of both small and large intestine with unspecified complications: Principal | ICD-10-CM

## 2023-10-26 ENCOUNTER — Encounter: Admit: 2023-10-26 | Discharge: 2023-10-27 | Payer: Medicare (Managed Care)

## 2023-10-26 DIAGNOSIS — K50819 Crohn's disease of both small and large intestine with unspecified complications: Principal | ICD-10-CM

## 2023-11-03 DIAGNOSIS — K50919 Crohn's disease, unspecified, with unspecified complications: Principal | ICD-10-CM

## 2023-11-16 DIAGNOSIS — K50819 Crohn's disease of both small and large intestine with unspecified complications: Principal | ICD-10-CM

## 2023-11-16 MED ORDER — METHOTREXATE INJECTION (CONTAINS PRESERVATIVE) 25 MG/ML (NON-ONCOLOGY)
SUBCUTANEOUS | 0 refills | 0.00000 days
Start: 2023-11-16 — End: ?

## 2023-11-16 MED ORDER — SYRINGE (DISPOSABLE) 3 ML
3 refills | 0.00000 days
Start: 2023-11-16 — End: ?

## 2023-11-17 MED ORDER — METHOTREXATE INJECTION (CONTAINS PRESERVATIVE) 25 MG/ML (NON-ONCOLOGY)
SUBCUTANEOUS | 0 refills | 0.00000 days
Start: 2023-11-17 — End: ?

## 2023-11-17 MED ORDER — SYRINGE (DISPOSABLE) 3 ML
3 refills | 0.00000 days | Status: CP
Start: 2023-11-17 — End: ?

## 2023-11-18 MED ORDER — METHOTREXATE INJECTION (CONTAINS PRESERVATIVE) 25 MG/ML (NON-ONCOLOGY)
SUBCUTANEOUS | 2 refills | 0.00000 days | Status: CP
Start: 2023-11-18 — End: 2023-11-19

## 2023-11-22 DIAGNOSIS — K50819 Crohn's disease of both small and large intestine with unspecified complications: Principal | ICD-10-CM

## 2023-11-23 ENCOUNTER — Encounter: Admit: 2023-11-23 | Discharge: 2023-11-23 | Payer: Medicare (Managed Care)

## 2023-12-02 DIAGNOSIS — Z79899 Other long term (current) drug therapy: Principal | ICD-10-CM

## 2023-12-02 DIAGNOSIS — D229 Melanocytic nevi, unspecified: Principal | ICD-10-CM

## 2023-12-02 DIAGNOSIS — L821 Other seborrheic keratosis: Principal | ICD-10-CM

## 2023-12-02 DIAGNOSIS — Z85820 Personal history of malignant melanoma of skin: Principal | ICD-10-CM

## 2023-12-02 DIAGNOSIS — D1801 Hemangioma of skin and subcutaneous tissue: Principal | ICD-10-CM

## 2023-12-02 DIAGNOSIS — L814 Other melanin hyperpigmentation: Principal | ICD-10-CM

## 2023-12-02 DIAGNOSIS — R21 Rash and other nonspecific skin eruption: Principal | ICD-10-CM

## 2023-12-10 ENCOUNTER — Encounter: Admit: 2023-12-10 | Discharge: 2023-12-10 | Payer: Medicare (Managed Care)

## 2023-12-10 ENCOUNTER — Inpatient Hospital Stay: Admit: 2023-12-10 | Discharge: 2023-12-10 | Payer: Medicare (Managed Care)

## 2023-12-13 DIAGNOSIS — K50819 Crohn's disease of both small and large intestine with unspecified complications: Principal | ICD-10-CM

## 2023-12-13 MED ORDER — SKYRIZI 360 MG/2.4 ML (150 MG/ML) SUBCUTANEOUS WEARABLE INJECTOR
SUBCUTANEOUS | 5 refills | 0.00000 days | Status: CP
Start: 2023-12-13 — End: ?

## 2023-12-21 ENCOUNTER — Encounter: Admit: 2023-12-21 | Discharge: 2023-12-21 | Payer: Medicare (Managed Care)

## 2024-01-13 ENCOUNTER — Ambulatory Visit: Admit: 2024-01-13 | Discharge: 2024-01-14 | Payer: Medicare (Managed Care)

## 2024-01-13 DIAGNOSIS — T148XXA Other injury of unspecified body region, initial encounter: Principal | ICD-10-CM

## 2024-01-19 ENCOUNTER — Ambulatory Visit: Admit: 2024-01-19 | Discharge: 2024-01-20 | Payer: Medicare (Managed Care)

## 2024-01-19 DIAGNOSIS — Z79899 Other long term (current) drug therapy: Principal | ICD-10-CM

## 2024-01-19 DIAGNOSIS — K50819 Crohn's disease of both small and large intestine with unspecified complications: Principal | ICD-10-CM

## 2024-01-19 DIAGNOSIS — D84821 Immunosuppression due to drug therapy (HHS-HCC): Principal | ICD-10-CM

## 2024-02-14 DIAGNOSIS — K50819 Crohn's disease of both small and large intestine with unspecified complications: Principal | ICD-10-CM

## 2024-03-08 DIAGNOSIS — K50819 Crohn's disease of both small and large intestine with unspecified complications: Principal | ICD-10-CM

## 2024-04-17 ENCOUNTER — Ambulatory Visit: Admit: 2024-04-17 | Discharge: 2024-04-18 | Payer: Medicare (Managed Care)

## 2024-04-17 DIAGNOSIS — T148XXA Other injury of unspecified body region, initial encounter: Principal | ICD-10-CM

## 2024-05-06 ENCOUNTER — Inpatient Hospital Stay (HOSPITAL_COMMUNITY)
Admission: EM | Admit: 2024-05-06 | Discharge: 2024-05-10 | DRG: 377 | Disposition: A | Discharge status: Home / Self Care | Attending: Internal Medicine | Admitting: Internal Medicine

## 2024-05-06 ENCOUNTER — Emergency Department (HOSPITAL_COMMUNITY)

## 2024-05-06 DIAGNOSIS — I10 Essential (primary) hypertension: Secondary | ICD-10-CM | POA: Diagnosis present

## 2024-05-06 DIAGNOSIS — D62 Acute posthemorrhagic anemia: Secondary | ICD-10-CM | POA: Diagnosis present

## 2024-05-06 DIAGNOSIS — D638 Anemia in other chronic diseases classified elsewhere: Secondary | ICD-10-CM | POA: Diagnosis present

## 2024-05-06 DIAGNOSIS — Z888 Allergy status to other drugs, medicaments and biological substances status: Secondary | ICD-10-CM

## 2024-05-06 DIAGNOSIS — G9341 Metabolic encephalopathy: Secondary | ICD-10-CM | POA: Diagnosis present

## 2024-05-06 DIAGNOSIS — Z87891 Personal history of nicotine dependence: Secondary | ICD-10-CM

## 2024-05-06 DIAGNOSIS — E860 Dehydration: Secondary | ICD-10-CM | POA: Diagnosis present

## 2024-05-06 DIAGNOSIS — Z6832 Body mass index (BMI) 32.0-32.9, adult: Secondary | ICD-10-CM

## 2024-05-06 DIAGNOSIS — K50911 Crohn's disease, unspecified, with rectal bleeding: Secondary | ICD-10-CM | POA: Diagnosis present

## 2024-05-06 DIAGNOSIS — Z1152 Encounter for screening for COVID-19: Secondary | ICD-10-CM

## 2024-05-06 DIAGNOSIS — E785 Hyperlipidemia, unspecified: Secondary | ICD-10-CM | POA: Diagnosis present

## 2024-05-06 DIAGNOSIS — I959 Hypotension, unspecified: Secondary | ICD-10-CM | POA: Diagnosis present

## 2024-05-06 DIAGNOSIS — Z8719 Personal history of other diseases of the digestive system: Secondary | ICD-10-CM

## 2024-05-06 DIAGNOSIS — K254 Chronic or unspecified gastric ulcer with hemorrhage: Principal | ICD-10-CM | POA: Diagnosis present

## 2024-05-06 DIAGNOSIS — E538 Deficiency of other specified B group vitamins: Secondary | ICD-10-CM | POA: Diagnosis present

## 2024-05-06 DIAGNOSIS — N179 Acute kidney failure, unspecified: Secondary | ICD-10-CM | POA: Diagnosis present

## 2024-05-06 DIAGNOSIS — J101 Influenza due to other identified influenza virus with other respiratory manifestations: Principal | ICD-10-CM | POA: Diagnosis present

## 2024-05-06 DIAGNOSIS — D649 Anemia, unspecified: Secondary | ICD-10-CM

## 2024-05-06 DIAGNOSIS — R4182 Altered mental status, unspecified: Secondary | ICD-10-CM

## 2024-05-06 DIAGNOSIS — L4050 Arthropathic psoriasis, unspecified: Secondary | ICD-10-CM | POA: Diagnosis present

## 2024-05-06 DIAGNOSIS — Z932 Ileostomy status: Secondary | ICD-10-CM

## 2024-05-06 DIAGNOSIS — Z7985 Long-term (current) use of injectable non-insulin antidiabetic drugs: Secondary | ICD-10-CM

## 2024-05-06 DIAGNOSIS — F32A Depression, unspecified: Secondary | ICD-10-CM | POA: Diagnosis present

## 2024-05-06 DIAGNOSIS — Z79899 Other long term (current) drug therapy: Secondary | ICD-10-CM

## 2024-05-06 DIAGNOSIS — R011 Cardiac murmur, unspecified: Secondary | ICD-10-CM | POA: Diagnosis present

## 2024-05-06 DIAGNOSIS — Z885 Allergy status to narcotic agent status: Secondary | ICD-10-CM

## 2024-05-06 DIAGNOSIS — Z9889 Other specified postprocedural states: Secondary | ICD-10-CM

## 2024-05-06 DIAGNOSIS — D509 Iron deficiency anemia, unspecified: Secondary | ICD-10-CM | POA: Diagnosis present

## 2024-05-06 DIAGNOSIS — Z7984 Long term (current) use of oral hypoglycemic drugs: Secondary | ICD-10-CM

## 2024-05-06 DIAGNOSIS — E119 Type 2 diabetes mellitus without complications: Secondary | ICD-10-CM | POA: Diagnosis present

## 2024-05-06 DIAGNOSIS — E66811 Obesity, class 1: Secondary | ICD-10-CM | POA: Diagnosis present

## 2024-05-06 DIAGNOSIS — Z79631 Long term (current) use of antimetabolite agent: Secondary | ICD-10-CM

## 2024-05-06 DIAGNOSIS — Z833 Family history of diabetes mellitus: Secondary | ICD-10-CM

## 2024-05-06 DIAGNOSIS — R198 Other specified symptoms and signs involving the digestive system and abdomen: Secondary | ICD-10-CM

## 2024-05-06 LAB — CBC WITH DIFFERENTIAL/PLATELET
Abs Immature Granulocytes: 0.37 K/uL — ABNORMAL HIGH (ref 0.00–0.07)
Basophils Absolute: 0 K/uL (ref 0.0–0.1)
Basophils Relative: 0 %
Eosinophils Absolute: 0.3 K/uL (ref 0.0–0.5)
Eosinophils Relative: 3 %
HCT: 24.6 % — ABNORMAL LOW (ref 39.0–52.0)
Hemoglobin: 7.8 g/dL — ABNORMAL LOW (ref 13.0–17.0)
Immature Granulocytes: 4 %
Lymphocytes Relative: 3 %
Lymphs Abs: 0.3 K/uL — ABNORMAL LOW (ref 0.7–4.0)
MCH: 28.4 pg (ref 26.0–34.0)
MCHC: 31.7 g/dL (ref 30.0–36.0)
MCV: 89.5 fL (ref 80.0–100.0)
Monocytes Absolute: 0.3 K/uL (ref 0.1–1.0)
Monocytes Relative: 3 %
Neutro Abs: 8.6 K/uL — ABNORMAL HIGH (ref 1.7–7.7)
Neutrophils Relative %: 87 %
Platelets: 330 K/uL (ref 150–400)
RBC: 2.75 MIL/uL — ABNORMAL LOW (ref 4.22–5.81)
RDW: 15.3 % (ref 11.5–15.5)
WBC: 9.9 K/uL (ref 4.0–10.5)
nRBC: 0.2 % (ref 0.0–0.2)

## 2024-05-06 LAB — COMPREHENSIVE METABOLIC PANEL WITH GFR
ALT: 29 U/L (ref 0–44)
AST: 32 U/L (ref 15–41)
Albumin: 3.6 g/dL (ref 3.5–5.0)
Alkaline Phosphatase: 86 U/L (ref 38–126)
Anion gap: 12 (ref 5–15)
BUN: 14 mg/dL (ref 8–23)
CO2: 19 mmol/L — ABNORMAL LOW (ref 22–32)
Calcium: 8.7 mg/dL — ABNORMAL LOW (ref 8.9–10.3)
Chloride: 103 mmol/L (ref 98–111)
Creatinine, Ser: 1.34 mg/dL — ABNORMAL HIGH (ref 0.61–1.24)
GFR, Estimated: 55 mL/min — ABNORMAL LOW
Glucose, Bld: 263 mg/dL — ABNORMAL HIGH (ref 70–99)
Potassium: 3.9 mmol/L (ref 3.5–5.1)
Sodium: 134 mmol/L — ABNORMAL LOW (ref 135–145)
Total Bilirubin: 0.3 mg/dL (ref 0.0–1.2)
Total Protein: 7 g/dL (ref 6.5–8.1)

## 2024-05-06 LAB — RESP PANEL BY RT-PCR (RSV, FLU A&B, COVID)  RVPGX2
Influenza A by PCR: POSITIVE — AB
Influenza B by PCR: NEGATIVE
Resp Syncytial Virus by PCR: NEGATIVE
SARS Coronavirus 2 by RT PCR: NEGATIVE

## 2024-05-06 MED ORDER — SODIUM CHLORIDE 0.9 % IV BOLUS
500.0000 mL | Freq: Once | INTRAVENOUS | Status: AC
  Administered 2024-05-07: 500 mL via INTRAVENOUS

## 2024-05-06 MED ORDER — OSELTAMIVIR PHOSPHATE 75 MG PO CAPS
75.0000 mg | ORAL_CAPSULE | Freq: Once | ORAL | Status: AC
  Administered 2024-05-07: 75 mg via ORAL
  Filled 2024-05-06: qty 1

## 2024-05-06 MED ORDER — PANTOPRAZOLE SODIUM 40 MG IV SOLR
80.0000 mg | Freq: Once | INTRAVENOUS | Status: AC
  Administered 2024-05-07: 80 mg via INTRAVENOUS
  Filled 2024-05-06: qty 20

## 2024-05-06 NOTE — ED Provider Triage Note (Signed)
 Emergency Medicine Provider Triage Evaluation Note  Toran Murch , a 73 y.o. male  was evaluated in triage.  Pt complains of dry cough, congestion, fever for past 3 days. Shob for 2 weeks. No NV. No known sick contacts. Tmax 101F Chills / rigor, confusion for 3 hours today so called 911  Review of Systems  Positive: See hpi Negative:   Physical Exam  BP (!) 128/56 (BP Location: Right Arm)   Pulse 86   Temp 98.1 F (36.7 C) (Oral)   Resp 16   Ht 5' 7 (1.702 m)   Wt 93.9 kg   SpO2 100%   BMI 32.42 kg/m  Gen:   Awake, no distress   Resp:  Normal effort  MSK:   Moves extremities without difficulty  Other:  No pedal edema  Medical Decision Making  Medically screening exam initiated at 8:00 PM.  Appropriate orders placed.  Amin Fornwalt was informed that the remainder of the evaluation will be completed by another provider, this initial triage assessment does not replace that evaluation, and the importance of remaining in the ED until their evaluation is complete.  Labs and CXR ordered   Minnie Tinnie BRAVO, [place] 05/06/24 2008

## 2024-05-06 NOTE — ED Provider Notes (Signed)
 " [place] EMERGENCY DEPARTMENT AT [facility] Provider Note   CSN: [id number] Arrival date & time: 05/06/24  1640     Patient presents with: No chief complaint on file.   Benjamin Chapman is a 73 y.o. male.  {Add pertinent medical, surgical, social history, OB history to YEP:67052} The history is provided by the spouse, the patient and medical records.  Benjamin Chapman is a 73 y.o. male who presents to the Emergency Department complaining of *** Sob, fever, confused 8 hours ago.  Does not recall EMS For several weeks pale and fatigue.  Had two teeth pulled two weeks ago.   Abx for two weeks - completed last week for dental infection.  Has ileostomy and gut infection and treated with abx for fistula.  Had rectal leaking.  No abdominal pain, vomiting Minimal rectal leakage.   Ileostomy output liquid for 1.5 weeks. Black three days ago, now orange. Black for several days.  No blood thinners  Celebrex, skyrizi, 81 asa. No bleeding ulcer    Prior to Admission medications  Medication Sig Start Date End Date Taking? Authorizing Provider  acetaminophen  (TYLENOL ) 500 MG tablet Take 500-1,000 mg by mouth every 6 (six) hours as needed for mild pain or moderate pain.    [provider]  amLODipine  (NORVASC ) 10 MG tablet Take 1 tablet (10 mg total) by mouth daily. PT. MUST MAKE AN APPOINTMENT IN ORDER TO RECEIVE FUTURE REFILLS. SECOND ATTEMPT. 09/22/23   Ladona Heinz, MD  escitalopram  (LEXAPRO ) 5 MG tablet Take 5 mg by mouth daily.    [provider]  losartan  (COZAAR ) 50 MG tablet Take 1 tablet (50 mg total) by mouth daily. PT. MUST MAKE AN APPOINTMENT IN ORDER TO RECEIVE FUTURE REFILLS. SECOND ATTEMPT. 09/22/23   Ladona Heinz, MD  metFORMIN (GLUCOPHAGE) 500 MG tablet Take 1,000 mg by mouth daily with breakfast.    [provider]  metoprolol  succinate (TOPROL -XL) 25 MG 24 hr tablet Take 1 tablet (25 mg total) by mouth daily. TAKE WITH OR IMMEDIATELY FOLLOWING  A MEAL. PT. MUST MAKE AN APPOINTMENT IN ORDER TO RECEIVE FUTURE REFILLS. SECOND ATTEMPT. 09/22/23   Ladona Heinz, MD  nitroGLYCERIN  (NITROSTAT ) 0.4 MG SL tablet DISSOLVE 1 TABLET UNDER THE TONGUE EVERY 5 MINUTES AS  NEEDED FOR CHEST PAIN. MAX  OF 3 TABLETS IN 15 MINUTES. CALL 911 IF PAIN PERSISTS. 02/01/20   Ladona Heinz, MD  rosuvastatin  (CRESTOR ) 5 MG tablet Take 1 tablet (5 mg total) by mouth daily. 08/25/22   Ladona Heinz, MD  traZODone  (DESYREL ) 50 MG tablet Take 50-100 mg by mouth at bedtime as needed for sleep.    [provider]    Allergies: Codeine, Escitalopram , Infliximab , Other, and Sertraline    Review of Systems  All other systems reviewed and are negative.   Updated Vital Signs BP 126/62 (BP Location: Right Arm)   Pulse 85   Temp 98.7 F (37.1 C) (Oral)   Resp 17   Ht 5' 7 (1.702 m)   Wt 93.9 kg   SpO2 95%   BMI 32.42 kg/m   Physical Exam Vitals and nursing note reviewed.  Constitutional:      Appearance: He is well-developed.  HENT:     Head: Normocephalic and atraumatic.  Cardiovascular:     Rate and Rhythm: Normal rate and regular rhythm.     Heart sounds: No murmur heard. Pulmonary:     Effort: Pulmonary effort is normal. No respiratory distress.     Breath  sounds: Normal breath sounds.  Abdominal:     Palpations: Abdomen is soft.     Tenderness: There is no abdominal tenderness. There is no guarding or rebound.     Comments: Ileostomy pouch in RLQ  Musculoskeletal:        General: No swelling or tenderness.  Skin:    General: Skin is warm and dry.  Neurological:     Mental Status: He is alert and oriented to person, place, and time.  Psychiatric:        Behavior: Behavior normal.     (all labs ordered are listed, but only abnormal results are displayed) Labs Reviewed  RESP PANEL BY RT-PCR (RSV, FLU A&B, COVID)  RVPGX2 - Abnormal; Notable for the following components:      Result Value   Influenza A by PCR POSITIVE (*)    All other  components within normal limits  CBC WITH DIFFERENTIAL/PLATELET - Abnormal; Notable for the following components:   RBC 2.75 (*)    Hemoglobin 7.8 (*)    HCT 24.6 (*)    Neutro Abs 8.6 (*)    Lymphs Abs 0.3 (*)    Abs Immature Granulocytes 0.37 (*)    All other components within normal limits  COMPREHENSIVE METABOLIC PANEL WITH GFR - Abnormal; Notable for the following components:   Sodium 134 (*)    CO2 19 (*)    Glucose, Bld 263 (*)    Creatinine, Ser 1.34 (*)    Calcium  8.7 (*)    GFR, Estimated 55 (*)    All other components within normal limits    EKG: None  Radiology: DG Chest 2 View Result Date: 05/06/2024 EXAM: 2 VIEW(S) XRAY OF THE CHEST 05/06/2024 07:32:02 PM COMPARISON: Chest x-ray 03/13/2008. CLINICAL HISTORY: chest pain ; congestion FINDINGS: LUNGS AND PLEURA: No focal pulmonary opacity. No pleural effusion. No pneumothorax. HEART AND MEDIASTINUM: No acute abnormality of the cardiac and mediastinal silhouettes. BONES AND SOFT TISSUES: Multilevel thoracic degenerative changes. IMPRESSION: 1. No acute cardiopulmonary process. Electronically signed by: Greig Pique MD 05/06/2024 08:26 PM EST RP Workstation: [id number]    {Document cardiac monitor, telemetry assessment procedure when appropriate:32947} Procedures   Medications Ordered in the ED - No data to display    {Click here for ABCD2, HEART and other calculators REFRESH Note before signing:1}                              Medical Decision Making Amount and/or Complexity of Data Reviewed Labs: ordered. Radiology: ordered.   ***  {Document critical care time when appropriate  Document review of labs and clinical decision tools ie CHADS2VASC2, etc  Document your independent review of radiology images and any outside records  Document your discussion with family members, caretakers and with consultants  Document social determinants of health affecting pt's care  Document your decision making why or why not  admission, treatments were needed:32947:::1}   Final diagnoses:  None    ED Discharge Orders     None        "

## 2024-05-06 NOTE — ED Triage Notes (Signed)
 Bib ems Fever chills fatigue cough congestion nausea shobr starting today Denies sick contacts  Vs wnl w/ EMS  O2 w/o o2 93-94

## 2024-05-07 ENCOUNTER — Other Ambulatory Visit: Payer: Self-pay

## 2024-05-07 ENCOUNTER — Encounter (HOSPITAL_COMMUNITY): Payer: Self-pay | Admitting: Internal Medicine

## 2024-05-07 DIAGNOSIS — F32A Depression, unspecified: Secondary | ICD-10-CM | POA: Diagnosis present

## 2024-05-07 DIAGNOSIS — J101 Influenza due to other identified influenza virus with other respiratory manifestations: Secondary | ICD-10-CM | POA: Diagnosis present

## 2024-05-07 DIAGNOSIS — E119 Type 2 diabetes mellitus without complications: Secondary | ICD-10-CM

## 2024-05-07 DIAGNOSIS — I10 Essential (primary) hypertension: Secondary | ICD-10-CM | POA: Diagnosis present

## 2024-05-07 DIAGNOSIS — Z8719 Personal history of other diseases of the digestive system: Secondary | ICD-10-CM | POA: Diagnosis not present

## 2024-05-07 DIAGNOSIS — K50911 Crohn's disease, unspecified, with rectal bleeding: Secondary | ICD-10-CM | POA: Diagnosis present

## 2024-05-07 DIAGNOSIS — E785 Hyperlipidemia, unspecified: Secondary | ICD-10-CM | POA: Diagnosis present

## 2024-05-07 DIAGNOSIS — E7849 Other hyperlipidemia: Secondary | ICD-10-CM | POA: Diagnosis not present

## 2024-05-07 DIAGNOSIS — E860 Dehydration: Secondary | ICD-10-CM | POA: Diagnosis present

## 2024-05-07 DIAGNOSIS — Z932 Ileostomy status: Secondary | ICD-10-CM | POA: Diagnosis not present

## 2024-05-07 DIAGNOSIS — Z1152 Encounter for screening for COVID-19: Secondary | ICD-10-CM | POA: Diagnosis not present

## 2024-05-07 DIAGNOSIS — D62 Acute posthemorrhagic anemia: Secondary | ICD-10-CM | POA: Diagnosis present

## 2024-05-07 DIAGNOSIS — R509 Fever, unspecified: Secondary | ICD-10-CM | POA: Diagnosis present

## 2024-05-07 DIAGNOSIS — R410 Disorientation, unspecified: Secondary | ICD-10-CM | POA: Diagnosis not present

## 2024-05-07 DIAGNOSIS — E66811 Obesity, class 1: Secondary | ICD-10-CM | POA: Diagnosis present

## 2024-05-07 DIAGNOSIS — R4182 Altered mental status, unspecified: Secondary | ICD-10-CM

## 2024-05-07 DIAGNOSIS — L4050 Arthropathic psoriasis, unspecified: Secondary | ICD-10-CM | POA: Diagnosis present

## 2024-05-07 DIAGNOSIS — K254 Chronic or unspecified gastric ulcer with hemorrhage: Secondary | ICD-10-CM | POA: Diagnosis present

## 2024-05-07 DIAGNOSIS — N179 Acute kidney failure, unspecified: Secondary | ICD-10-CM | POA: Diagnosis present

## 2024-05-07 DIAGNOSIS — G9341 Metabolic encephalopathy: Secondary | ICD-10-CM | POA: Diagnosis present

## 2024-05-07 DIAGNOSIS — Z7984 Long term (current) use of oral hypoglycemic drugs: Secondary | ICD-10-CM | POA: Diagnosis not present

## 2024-05-07 DIAGNOSIS — Z7985 Long-term (current) use of injectable non-insulin antidiabetic drugs: Secondary | ICD-10-CM | POA: Diagnosis not present

## 2024-05-07 DIAGNOSIS — Z79899 Other long term (current) drug therapy: Secondary | ICD-10-CM | POA: Diagnosis not present

## 2024-05-07 DIAGNOSIS — D509 Iron deficiency anemia, unspecified: Secondary | ICD-10-CM | POA: Diagnosis present

## 2024-05-07 DIAGNOSIS — Z833 Family history of diabetes mellitus: Secondary | ICD-10-CM | POA: Diagnosis not present

## 2024-05-07 DIAGNOSIS — D638 Anemia in other chronic diseases classified elsewhere: Secondary | ICD-10-CM | POA: Diagnosis present

## 2024-05-07 DIAGNOSIS — D649 Anemia, unspecified: Secondary | ICD-10-CM | POA: Diagnosis not present

## 2024-05-07 DIAGNOSIS — R198 Other specified symptoms and signs involving the digestive system and abdomen: Secondary | ICD-10-CM

## 2024-05-07 DIAGNOSIS — K259 Gastric ulcer, unspecified as acute or chronic, without hemorrhage or perforation: Secondary | ICD-10-CM | POA: Diagnosis not present

## 2024-05-07 DIAGNOSIS — Z9889 Other specified postprocedural states: Secondary | ICD-10-CM

## 2024-05-07 DIAGNOSIS — E538 Deficiency of other specified B group vitamins: Secondary | ICD-10-CM | POA: Diagnosis present

## 2024-05-07 DIAGNOSIS — Z87891 Personal history of nicotine dependence: Secondary | ICD-10-CM | POA: Diagnosis not present

## 2024-05-07 LAB — GASTROINTESTINAL PANEL BY PCR, STOOL (REPLACES STOOL CULTURE)

## 2024-05-07 LAB — CBC
HCT: 22.5 % — ABNORMAL LOW (ref 39.0–52.0)
Hemoglobin: 7.1 g/dL — ABNORMAL LOW (ref 13.0–17.0)
MCH: 28.9 pg (ref 26.0–34.0)
MCHC: 31.6 g/dL (ref 30.0–36.0)
MCV: 91.5 fL (ref 80.0–100.0)
Platelets: 300 K/uL (ref 150–400)
RBC: 2.46 MIL/uL — ABNORMAL LOW (ref 4.22–5.81)
RDW: 15.3 % (ref 11.5–15.5)
WBC: 7.5 K/uL (ref 4.0–10.5)
nRBC: 0 % (ref 0.0–0.2)

## 2024-05-07 LAB — COMPREHENSIVE METABOLIC PANEL WITH GFR
ALT: 26 U/L (ref 0–44)
AST: 29 U/L (ref 15–41)
Albumin: 3.4 g/dL — ABNORMAL LOW (ref 3.5–5.0)
Alkaline Phosphatase: 81 U/L (ref 38–126)
Anion gap: 8 (ref 5–15)
BUN: 13 mg/dL (ref 8–23)
CO2: 22 mmol/L (ref 22–32)
Calcium: 8.5 mg/dL — ABNORMAL LOW (ref 8.9–10.3)
Chloride: 105 mmol/L (ref 98–111)
Creatinine, Ser: 1.33 mg/dL — ABNORMAL HIGH (ref 0.61–1.24)
GFR, Estimated: 56 mL/min — ABNORMAL LOW
Glucose, Bld: 154 mg/dL — ABNORMAL HIGH (ref 70–99)
Potassium: 4.4 mmol/L (ref 3.5–5.1)
Sodium: 136 mmol/L (ref 135–145)
Total Bilirubin: 0.3 mg/dL (ref 0.0–1.2)
Total Protein: 6.7 g/dL (ref 6.5–8.1)

## 2024-05-07 LAB — FERRITIN: Ferritin: 150 ng/mL (ref 24–336)

## 2024-05-07 LAB — URINALYSIS, ROUTINE W REFLEX MICROSCOPIC
Bilirubin Urine: NEGATIVE
Glucose, UA: 50 mg/dL — AB
Hgb urine dipstick: NEGATIVE
Ketones, ur: NEGATIVE mg/dL
Leukocytes,Ua: NEGATIVE
Nitrite: NEGATIVE
Protein, ur: NEGATIVE mg/dL
Specific Gravity, Urine: 1.014 (ref 1.005–1.030)
pH: 5 (ref 5.0–8.0)

## 2024-05-07 LAB — CBG MONITORING, ED
Glucose-Capillary: 131 mg/dL — ABNORMAL HIGH (ref 70–99)
Glucose-Capillary: 139 mg/dL — ABNORMAL HIGH (ref 70–99)

## 2024-05-07 LAB — GLUCOSE, CAPILLARY
Glucose-Capillary: 176 mg/dL — ABNORMAL HIGH (ref 70–99)
Glucose-Capillary: 122 mg/dL — ABNORMAL HIGH (ref 70–99)

## 2024-05-07 LAB — RETICULOCYTES
Immature Retic Fract: 27.6 % — ABNORMAL HIGH (ref 2.3–15.9)
RBC.: 2.61 MIL/uL — ABNORMAL LOW (ref 4.22–5.81)
Retic Count, Absolute: 68.6 K/uL (ref 19.0–186.0)
Retic Ct Pct: 2.6 % (ref 0.4–3.1)

## 2024-05-07 LAB — IRON AND TIBC
Iron: 15 ug/dL — ABNORMAL LOW (ref 45–182)
Saturation Ratios: 4 % — ABNORMAL LOW (ref 17.9–39.5)
TIBC: 395 ug/dL (ref 250–450)
UIBC: 380 ug/dL

## 2024-05-07 LAB — C DIFFICILE QUICK SCREEN W PCR REFLEX
C Diff antigen: NEGATIVE
C Diff interpretation: NOT DETECTED
C Diff toxin: NEGATIVE

## 2024-05-07 LAB — CREATININE, URINE, RANDOM: Creatinine, Urine: 176 mg/dL

## 2024-05-07 LAB — SODIUM, URINE, RANDOM: Sodium, Ur: <30 mmol/L

## 2024-05-07 LAB — I-STAT CG4 LACTIC ACID, ED: Lactic Acid, Venous: 1.4 mmol/L (ref 0.5–1.9)

## 2024-05-07 LAB — ABO/RH: ABO/RH(D): O NEG

## 2024-05-07 LAB — POC OCCULT BLOOD, ED: Fecal Occult Bld: NEGATIVE

## 2024-05-07 LAB — VITAMIN B12: Vitamin B-12: 656 pg/mL (ref 180–914)

## 2024-05-07 LAB — PREPARE RBC (CROSSMATCH)

## 2024-05-07 LAB — FOLATE: Folate: 3.2 ng/mL — ABNORMAL LOW

## 2024-05-07 LAB — HEMOGLOBIN A1C
Hgb A1c MFr Bld: 7.5 % — ABNORMAL HIGH (ref 4.8–5.6)
Mean Plasma Glucose: 168.55 mg/dL

## 2024-05-07 MED ORDER — ONDANSETRON HCL 4 MG PO TABS: 4.0000 mg | ORAL_TABLET | Freq: Four times a day (QID) | ORAL | Status: DC | PRN

## 2024-05-07 MED ORDER — SODIUM CHLORIDE 0.9% FLUSH
3.0000 mL | Freq: Two times a day (BID) | INTRAVENOUS | Status: DC
  Administered 2024-05-07 – 2024-05-09 (×5): 3 mL via INTRAVENOUS

## 2024-05-07 MED ORDER — PANTOPRAZOLE SODIUM 40 MG IV SOLR
40.0000 mg | INTRAVENOUS | Status: DC
  Administered 2024-05-07: 40 mg via INTRAVENOUS
  Filled 2024-05-07: qty 10

## 2024-05-07 MED ORDER — LACTATED RINGERS IV SOLN: INTRAVENOUS | Status: DC

## 2024-05-07 MED ORDER — SODIUM CHLORIDE 0.9% IV SOLUTION: Freq: Once | INTRAVENOUS | Status: DC

## 2024-05-07 MED ORDER — PANTOPRAZOLE SODIUM 40 MG IV SOLR
40.0000 mg | Freq: Two times a day (BID) | INTRAVENOUS | Status: DC
  Administered 2024-05-07 – 2024-05-10 (×6): 40 mg via INTRAVENOUS
  Filled 2024-05-07 (×3): qty 10

## 2024-05-07 MED ORDER — SODIUM CHLORIDE 0.9 % IV SOLN: 250.0000 mL | INTRAVENOUS | Status: AC | PRN

## 2024-05-07 MED ORDER — OSELTAMIVIR PHOSPHATE 30 MG PO CAPS
30.0000 mg | ORAL_CAPSULE | Freq: Two times a day (BID) | ORAL | Status: DC
  Administered 2024-05-07 – 2024-05-10 (×7): 30 mg via ORAL
  Filled 2024-05-07 (×4): qty 1

## 2024-05-07 MED ORDER — ACETAMINOPHEN 650 MG RE SUPP: 650.0000 mg | Freq: Four times a day (QID) | RECTAL | Status: DC | PRN

## 2024-05-07 MED ORDER — IRON SUCROSE 200 MG IVPB - SIMPLE MED
200.0000 mg | Freq: Once | Status: AC
  Administered 2024-05-07: 200 mg via INTRAVENOUS
  Filled 2024-05-07: qty 200
  Filled 2024-05-07: qty 110

## 2024-05-07 MED ORDER — SODIUM CHLORIDE 0.9% FLUSH
3.0000 mL | Freq: Two times a day (BID) | INTRAVENOUS | Status: DC
  Administered 2024-05-07 – 2024-05-10 (×8): 3 mL via INTRAVENOUS

## 2024-05-07 MED ORDER — SODIUM CHLORIDE 0.9% FLUSH: 3.0000 mL | INTRAVENOUS | Status: DC | PRN

## 2024-05-07 MED ORDER — INSULIN ASPART 100 UNIT/ML IJ SOLN
0.0000 [IU] | Freq: Three times a day (TID) | INTRAMUSCULAR | Status: DC
  Administered 2024-05-07 – 2024-05-08 (×2): 1 [IU] via SUBCUTANEOUS
  Filled 2024-05-07 (×2): qty 1

## 2024-05-07 MED ORDER — ONDANSETRON HCL 4 MG/2ML IJ SOLN: 4.0000 mg | Freq: Four times a day (QID) | INTRAMUSCULAR | Status: DC | PRN

## 2024-05-07 MED ORDER — ACETAMINOPHEN 325 MG PO TABS: 650.0000 mg | ORAL_TABLET | Freq: Four times a day (QID) | ORAL | Status: DC | PRN

## 2024-05-07 MED ORDER — ROSUVASTATIN CALCIUM 5 MG PO TABS
5.0000 mg | ORAL_TABLET | Freq: Every day | ORAL | Status: DC
  Administered 2024-05-07 – 2024-05-10 (×4): 5 mg via ORAL
  Filled 2024-05-07 (×2): qty 1

## 2024-05-07 MED ORDER — FOLIC ACID 1 MG PO TABS
1.0000 mg | ORAL_TABLET | Freq: Every day | ORAL | Status: DC
  Administered 2024-05-07 – 2024-05-10 (×4): 1 mg via ORAL
  Filled 2024-05-07 (×2): qty 1

## 2024-05-07 NOTE — Consult Note (Incomplete)
 Entered in error

## 2024-05-07 NOTE — Progress Notes (Signed)
" °  Progress Note   Patient: Benjamin Chapman FMW:[id number] DOB: 1950/10/07 DOA: 05/06/2024     0 DOS: the patient was seen and examined on 05/07/2024 at 11:45 AM      Brief hospital course: 73 y.o. M with ileocolonic Crohn's disease, history of total abdominal colectomy end ileostomy, persistent rectal discharge status post robotic proctectomy and APR at [facility] in November 2024 by Dr. Unice, follows with [facility] gastroenterology, with plan for restaging ileoscopy in February or March, high output ostomy, previous history of small bowel obstruction in setting of Crohn's disease, also obesity, DM, HLD who presented with weakness and an altered episode.  On further questioning, reported to have melena and found to have hemoglobin 7.8.     Assessment and Plan: Acute blood loss anemia Melena Hemoglobin in Care Everywhere in September and October was 14.  On admission it was 7.8.  Iron  studies show low iron  saturation, low folate, B12 normal, reticulocytes low.  Total bilirubin normal, doubt hemolysis.  Initially FOBT was obtained and negative and so there was some diagnostic uncertainty. On further questioning, patient reports several days of dark black output in his ostomy, that resolved after some days, but was followed by intense fatigue and shortness of breath and pallor.    He does take Celebrex daily for joint pain and has for some time.  1 unit of PRBCs was ordered in the ER - IV PPI - Clears - Consult GI - Avoid NSAIDs - IV iron  - Trend hemoglobin   Acute metabolic encephalopathy Immediately preceding admission, the patient had gone down for a nap, and when he awoke, he seemed dazed and confused and was not responding properly to family.  He had no focal weakness or numbness.  This gradually improved in the ER.  I suspect this was some brief delirium in the setting of severe anemia and influenza. This seems resolving overnight.   Influenza A Incidental finding. -  Tamiflu   Crohn's disease - Consult [facility] - Hold home Skyrizi and methotrexate  Iron  deficiency Iron  saturation 4% - IV iron  - Oral iron  at discharge folate  Folate deficiency Incidental finding in setting of high output ostomy - Start oral supplement  Class I obesity -Hold home Ozempic  Type 2 diabetes Glucose controlled - Continue sliding scale corrections - Hold metformin  Hyperlipidemia -Continue Crestor   Hypertension Blood pressure low normal - Hold amlodipine  and losartan  until hemodynamics are clear        Subjective: Patient is feeling better, still somewhat pale, still quite weak.  No further melena.     Physical Exam: BP 128/60 (BP Location: Left Arm)   Pulse 65   Temp 98.1 F (36.7 C)   Resp 18   Ht 5' 7 (1.702 m)   Wt 93.9 kg   SpO2 95%   BMI 32.42 kg/m   The patient was seen and examined  Data Reviewed: Discussed with GI, Dr. Arlyss I will see Discussed with oncology Basic metabolic panel shows stable renal function CBC shows worsening anemia with iron  deficiency    Family Communication: Wife at the bedside    Disposition: Status is: Inpatient Patient was admitted with acute anemia, weakness, reported melena  Will need GI evaluation, hopefully EGD tomorrow, if EGD reassuring and no other findings and blood level improves, possibly home tomorrow evening         Author: Lonni SHAUNNA Dalton, MD 05/07/2024 5:32 PM  For on call review [email or web address].    "

## 2024-05-07 NOTE — ED Notes (Signed)
 Blood bank has blood ready for this patient.  Informed Francis, EMT-P.

## 2024-05-07 NOTE — Hospital Course (Signed)
 73 y.o. M with ileocolonic Crohn's disease, history of total abdominal colectomy end ileostomy, persistent rectal discharge status post robotic proctectomy and APR at [facility] in November 2024 by Dr. Unice, follows with [facility] gastroenterology, with plan for restaging ileoscopy in February or March, high output ostomy, previous history of small bowel obstruction in setting of Crohn's disease, also obesity, DM, HLD who presented with weakness and an altered episode.  On further questioning, reported to have melena and found to have hemoglobin 7.8.

## 2024-05-07 NOTE — H&P (Addendum)
 " History and Physical    Ramsay Bognar FMW:[id number] DOB: [birth year] DOA: 05/06/2024  PCP: Patient, No Pcp Per   Patient coming from: Home   Chief Complaint: Flulike symptoms ED TRIAGE note:Bib ems Fever chills fatigue cough congestion nausea shobr starting today Denies sick contacts  Vs wnl w/ EMS  O2 w/o o2 93-94   HPI:  Benjy Kana is a 73 y.o. male with medical history significant of Crohn's disease, partial proctectomy with end ileostomy followed by completion proctectomy and fistula creation, DM type II, essential hypertension , non-insulin -dependent DM type II, and hyperlipidemia presented to emergency department with complaining of shortness of breath, fever, and confusion 8-hour ago.  Patient reported that he has generalized fatigue for several weeks.  He had teeth extraction 2 weeks ago completed antibiotic course. Patient also reported noticed ileostomy output liquid black color 3 days ago which become orange. Patient denies any abdominal pain, nausea, vomiting, chest pain, palpitation, dizziness and headache.  He is currently on methotrexate 15 mg per week and has recently completed his last loading infusion of Skyrizi on August 12th 2025.  Patient also follows general surgery with [facility] perineal drainage require RTOR for skin bridge excision and curettage on 12/10/23 currently on conservative management with silver nitrate application to incisional pit.  ED Course:  At presentation to ED patient is hemodynamically stable.  Afebrile. Lab work, CBC no evidence of leukocytosis, low H&H 7.8 and 24 and normal platelet count.  Hemoglobin is 14.5 in September 2025. CMP showing low sodium 134, low bicarb 19, elevated creatinine 1.34 and GFR 55. FOBT negative. Respiratory panel positive with influenza A. Anemia panel showed normal ferritin, low iron  15 low iron  saturation and normal TIBC.  Low reticulocyte count.  Normal lactic acid level. Chest x-ray no active  disease process.  In the ED patient received IV Protonix , 500ml  of NS bolus and Tamiflu .  Patient's wife at the bedside reported that patient is more confused as compared to his baseline.  Hospitalist consulted for further evaluation management of altered mental status/confusion, acute anemia, AKI and influenza infection.   Significant labs in the ED: Lab Orders         Resp panel by RT-PCR (RSV, Flu A&B, Covid) Anterior Nasal Swab         Culture, blood (routine x 2)         Gastrointestinal Panel by PCR , Stool         C Difficile Quick Screen w PCR reflex         CBC with Differential         Comprehensive metabolic panel         Vitamin B12         Folate         Iron  and TIBC         Ferritin         Reticulocytes         Hemoglobin and hematocrit, blood         Urinalysis, Routine w reflex microscopic -Urine, Clean Catch         Creatinine, urine, random         Sodium, urine, random         Comprehensive metabolic panel         CBC         Hemoglobin A1c         POC occult blood, ED       Review of Systems:  Review of Systems  Constitutional:  Positive for fever and malaise/fatigue. Negative for chills and weight loss.  Respiratory:  Positive for shortness of breath. Negative for cough and sputum production.   Cardiovascular:  Negative for chest pain and palpitations.  Gastrointestinal:  Positive for diarrhea. Negative for abdominal pain, blood in stool, constipation, heartburn, melena, nausea and vomiting.  Musculoskeletal:  Positive for myalgias.  Neurological:  Negative for dizziness and headaches.  Psychiatric/Behavioral:  The patient is not nervous/anxious.     Past Medical History:  Diagnosis Date   Bilateral carotid bruits    Diabetes mellitus without complication (HCC)    Dyspnea    Hypertension    Psoriatic arthritis (HCC)    Systolic murmur of aorta    Ulcerative colitis, acute (HCC)     Past Surgical History:  Procedure Laterality Date    COLONOSCOPY WITH PROPOFOL  N/A 01/09/2019   Procedure: COLONOSCOPY WITH PROPOFOL ;  Surgeon: Gaylyn Gladis PENNER, MD;  Location: [facility] ENDOSCOPY;  Service: Endoscopy;  Laterality: N/A;   KNEE ARTHROSCOPY Right    MELANOMA EXCISION N/A 1992   back   SHOULDER ARTHROSCOPY     bi-cept     reports that he quit smoking about 25 years ago. His smoking use included cigarettes. He started smoking about 45 years ago. He has a 20 pack-year smoking history. He has never used smokeless tobacco. He reports current alcohol use. He reports that he does not use drugs.  Allergies[1]  Family History  Problem Relation Age of Onset   Alzheimer's disease Mother    Stroke Mother    Diabetes Mother     Prior to Admission medications  Medication Sig Start Date End Date Taking? Authorizing Provider  acetaminophen  (TYLENOL ) 500 MG tablet Take 500-1,000 mg by mouth every 6 (six) hours as needed for mild pain or moderate pain.    [provider]  amLODipine  (NORVASC ) 10 MG tablet Take 1 tablet (10 mg total) by mouth daily. PT. MUST MAKE AN APPOINTMENT IN ORDER TO RECEIVE FUTURE REFILLS. SECOND ATTEMPT. 09/22/23   Ladona Heinz, MD  escitalopram  (LEXAPRO ) 5 MG tablet Take 5 mg by mouth daily.    [provider]  losartan  (COZAAR ) 50 MG tablet Take 1 tablet (50 mg total) by mouth daily. PT. MUST MAKE AN APPOINTMENT IN ORDER TO RECEIVE FUTURE REFILLS. SECOND ATTEMPT. 09/22/23   Ladona Heinz, MD  metFORMIN (GLUCOPHAGE) 500 MG tablet Take 1,000 mg by mouth daily with breakfast.    [provider]  metoprolol  succinate (TOPROL -XL) 25 MG 24 hr tablet Take 1 tablet (25 mg total) by mouth daily. TAKE WITH OR IMMEDIATELY FOLLOWING A MEAL. PT. MUST MAKE AN APPOINTMENT IN ORDER TO RECEIVE FUTURE REFILLS. SECOND ATTEMPT. 09/22/23   Ladona Heinz, MD  nitroGLYCERIN  (NITROSTAT ) 0.4 MG SL tablet DISSOLVE 1 TABLET UNDER THE TONGUE EVERY 5 MINUTES AS  NEEDED FOR CHEST PAIN. MAX  OF 3 TABLETS IN 15 MINUTES. CALL 911 IF PAIN  PERSISTS. 02/01/20   Ladona Heinz, MD  rosuvastatin  (CRESTOR ) 5 MG tablet Take 1 tablet (5 mg total) by mouth daily. 08/25/22   Ladona Heinz, MD  traZODone  (DESYREL ) 50 MG tablet Take 50-100 mg by mouth at bedtime as needed for sleep.    [provider]     Physical Exam: Vitals:   05/06/24 1658 05/06/24 2020 05/06/24 2357 05/07/24 0445  BP:  126/62 117/60 (!) 114/59  Pulse:  85 66 62  Resp:  17 14 16   Temp:  98.7 F (37.1  C) 98.8 F (37.1 C) 97.8 F (36.6 C)  TempSrc:  Oral Oral   SpO2:  95% 93% 92%  Weight: 93.9 kg     Height: 5' 7 (1.702 m)       Physical Exam Vitals and nursing note reviewed.  Constitutional:      Appearance: He is ill-appearing.  HENT:     Mouth/Throat:     Mouth: Mucous membranes are dry.  Cardiovascular:     Rate and Rhythm: Normal rate and regular rhythm.     Pulses: Normal pulses.     Heart sounds: Normal heart sounds.  Pulmonary:     Effort: Pulmonary effort is normal.     Breath sounds: Normal breath sounds.  Abdominal:     Palpations: Abdomen is soft.     Tenderness: There is no abdominal tenderness. There is no guarding.  Musculoskeletal:     Cervical back: Neck supple.  Skin:    General: Skin is dry.     Capillary Refill: Capillary refill takes less than 2 seconds.  Neurological:     Mental Status: He is alert and oriented to person, place, and time.  Psychiatric:        Mood and Affect: Mood normal.      Labs on Admission: I have personally reviewed following labs and imaging studies  CBC: Recent Labs  Lab 05/06/24 1924 05/07/24 0502  WBC 9.9 7.5  NEUTROABS 8.6*  --   HGB 7.8* 7.1*  HCT 24.6* 22.5*  MCV 89.5 91.5  PLT 330 300   Basic Metabolic Panel: Recent Labs  Lab 05/06/24 1924 05/07/24 0502  NA 134* 136  K 3.9 4.4  CL 103 105  CO2 19* 22  GLUCOSE 263* 154*  BUN 14 13  CREATININE 1.34* 1.33*  CALCIUM  8.7* 8.5*   GFR: Estimated Creatinine Clearance: 52.4 mL/min (A) (by C-G formula based on SCr of  1.33 mg/dL (H)). Liver Function Tests: Recent Labs  Lab 05/06/24 1924 05/07/24 0502  AST 32 29  ALT 29 26  ALKPHOS 86 81  BILITOT 0.3 0.3  PROT 7.0 6.7  ALBUMIN 3.6 3.4*   No results for input(s): LIPASE, AMYLASE in the last 168 hours. No results for input(s): AMMONIA in the last 168 hours. Coagulation Profile: No results for input(s): INR, PROTIME in the last 168 hours. Cardiac Enzymes: No results for input(s): CKTOTAL, CKMB, CKMBINDEX, TROPONINI, TROPONINIHS in the last 168 hours. BNP (last 3 results) No results for input(s): BNP in the last 8760 hours. HbA1C: No results for input(s): HGBA1C in the last 72 hours. CBG: No results for input(s): GLUCAP in the last 168 hours. Lipid Profile: No results for input(s): CHOL, HDL, LDLCALC, TRIG, CHOLHDL, LDLDIRECT in the last 72 hours. Thyroid  Function Tests: No results for input(s): TSH, T4TOTAL, FREET4, T3FREE, THYROIDAB in the last 72 hours. Anemia Panel: Recent Labs    05/07/24 0008  VITAMINB12 656  FOLATE 3.2*  FERRITIN 150  TIBC 395  IRON  15*  RETICCTPCT 2.6   Urine analysis:    Component Value Date/Time   COLORURINE YELLOW 05/07/2024 0502   APPEARANCEUR CLEAR 05/07/2024 0502   LABSPEC 1.014 05/07/2024 0502   PHURINE 5.0 05/07/2024 0502   GLUCOSEU 50 (A) 05/07/2024 0502   HGBUR NEGATIVE 05/07/2024 0502   BILIRUBINUR NEGATIVE 05/07/2024 0502   KETONESUR NEGATIVE 05/07/2024 0502   PROTEINUR NEGATIVE 05/07/2024 0502   NITRITE NEGATIVE 05/07/2024 0502   LEUKOCYTESUR NEGATIVE 05/07/2024 0502    Radiological Exams on Admission: I have  personally reviewed images DG Chest 2 View Result Date: 05/06/2024 EXAM: 2 VIEW(S) XRAY OF THE CHEST 05/06/2024 07:32:02 PM COMPARISON: Chest x-ray 03/13/2008. CLINICAL HISTORY: chest pain ; congestion FINDINGS: LUNGS AND PLEURA: No focal pulmonary opacity. No pleural effusion. No pneumothorax. HEART AND MEDIASTINUM: No acute abnormality  of the cardiac and mediastinal silhouettes. BONES AND SOFT TISSUES: Multilevel thoracic degenerative changes. IMPRESSION: 1. No acute cardiopulmonary process. Electronically signed by: Greig Pique MD 05/06/2024 08:26 PM EST RP Workstation: [id number]     EKG: My personal interpretation of EKG shows: Normal sinus rhythm heart rate 67.    Assessment/Plan: Principal Problem:   Acute anemia unclear etiology Active Problems:   Altered mental status   AKI (acute kidney injury)   Influenza A H1N1 infection   Non-insulin  dependent type 2 diabetes mellitus (HCC)   Hyperlipidemia   History of Crohn's disease   High output ileostomy (HCC)   History of ileostomy   Essential hypertension    Assessment and Plan: Acute development of anemia-occult GI bleed versus anemia of chronic disease GI bleed has been ruled out - Presented emergency department complaining of fever, cough, fatigue, exertional dyspnea, noticed black color output through the ileostomy 3 days ago now transition to [employer].  Patient reported taking Celebrex and aspirin .  No history of GI bleed in the past. At presentation to ED patient is borderline hypotensive otherwise hemodynamically stable.  Afebrile. -CBC showing low hemoglobin 7.8 and hematocrit 34.  Baseline hemoglobin around 14.5 and 14.6 in August September 2025. FOBT negative.  Anemia panel representing anemia of chronic disease and iron  deficiency anemia as well. - In the ED patient received IV Protonix  80 mg.  However active GI bleed has been ruled out given FOBT negative. - Given patient has been taking Celebrex and aspirin  concern for occult GI bleed.  Continue IV Protonix  40 mg daily. - Continue to monitor H&H.  If hemoglobin drops below 7 will need transfusion. -Continue cardiac monitoring. Addendum - Hemoglobin 7.1 today morning.  Discussed with patient and agreeable with 1 unit of blood transfusion.   Altered mental status/intolerance of  confusion-improving -Altered mental status in the setting of influenza infection AKI.  Mental status has been improved and patient currently at alert oriented x 4.  Acute kidney injury -Elevated creatinine 1.34.  Prerenal acute kidney injury in setting of high ileostomy output.  Checking UA, urine creatinine and urine sodium.  In the ED patient received 500 mL of NS bolus.  Starting maintenance fluids 75 cc/h.  Influenza A infection -Patient complaining about fever, chill and cough.  Wife reported patient is more confused.  While in the ED patient is more alert oriented.  Acute metabolic encephalopathy/altered mental status in the setting of influenza infection and dehydration. - Respiratory panel positive with influenza A.  Chest x-ray evidence of pneumonia - Continue Tamiflu  30 mg twice daily for 5 days.  Non-insulin -dependent DM type II -Holding metformin in setting of AKI.  Continue sliding scale insulin  with mealtime coverage.  History of Crohn's disease History of ileostomy -Patient follows gastroenterology with [facility].  Currently on methotrexate 15 mg injection every week, Skyrizi every 2 weeks. -Pending pharmacy verification of methotrexate.   -Patient reported high high output from ileostomy. - Checking C. difficile and GI panel.   Essential hypertension -Blood pressure borderline soft.  In the setting of AKI and low blood pressure holding amlodipine , Toprol -XL and losartan .  Hyperlipidemia - Continue Crestor   Non-insulin -dependent DM type II - Holding metformin in  setting of AKI.  Continue sliding scale insulin  with mealtime coverage.  Generalized anxiety disorder - Pending pharmacy verification of Celexa.  DVT prophylaxis:  SCDs and TED hose.  Deferring pharmacological DVT prophylaxis in setting of low hemoglobin. Code Status:  Full Code Diet: Heart healthy carb modified diet Family Communication:   Family was present at bedside, at the time of  interview. Opportunity was given to ask question and all questions were answered satisfactorily.  Disposition Plan: Continue monitor H&H transfuse as needed if hemoglobin drops below 7. Consults: None indicated at this time Admission status:   Inpatient, Telemetry bed  Severity of Illness: The appropriate patient status for this patient is INPATIENT. Inpatient status is judged to be reasonable and necessary in order to provide the required intensity of service to ensure the patient's safety. The patient's presenting symptoms, physical exam findings, and initial radiographic and laboratory data in the context of their chronic comorbidities is felt to place them at high risk for further clinical deterioration. Furthermore, it is not anticipated that the patient will be medically stable for discharge from the hospital within 2 midnights of admission.   * I certify that at the point of admission it is my clinical judgment that the patient will require inpatient hospital care spanning beyond 2 midnights from the point of admission due to high intensity of service, high risk for further deterioration and high frequency of surveillance required.DEWAINE    Cody Oliger, MD [facility]  How to contact the [facility] Attending or Consulting provider 7A - 7P or covering provider during after hours 7P -7A, for this patient.  Check the care team in [facility] and look for a) attending/consulting [facility] provider listed and b) the [facility] team listed Log into [email or web address] and use [employer] universal password to access. If you do not have the password, please contact the hospital operator. Locate the [facility] provider you are looking for under [facility] and page to a number that you can be directly reached. If you still have difficulty reaching the provider, please page the [facility] (Director on Call) for the Hospitalists listed on amion for assistance.  05/07/2024, 6:52 AM           [1]  Allergies Allergen  Reactions   Codeine Other (See Comments)    Nausea   Escitalopram      Other reaction(s): fatigue, diarrhea, ED, Crohn's flareup   Infliximab  Other (See Comments)   Other Other (See Comments)   Sertraline Nausea And Vomiting   "

## 2024-05-08 DIAGNOSIS — D649 Anemia, unspecified: Secondary | ICD-10-CM | POA: Diagnosis not present

## 2024-05-08 LAB — COMPREHENSIVE METABOLIC PANEL WITH GFR
ALT: 25 U/L (ref 0–44)
AST: 37 U/L (ref 15–41)
Albumin: 3.2 g/dL — ABNORMAL LOW (ref 3.5–5.0)
Alkaline Phosphatase: 76 U/L (ref 38–126)
Anion gap: 11 (ref 5–15)
BUN: 8 mg/dL (ref 8–23)
CO2: 23 mmol/L (ref 22–32)
Calcium: 8.8 mg/dL — ABNORMAL LOW (ref 8.9–10.3)
Chloride: 106 mmol/L (ref 98–111)
Creatinine, Ser: 1.26 mg/dL — ABNORMAL HIGH (ref 0.61–1.24)
GFR, Estimated: 59 mL/min — ABNORMAL LOW
Glucose, Bld: 129 mg/dL — ABNORMAL HIGH (ref 70–99)
Potassium: 4.3 mmol/L (ref 3.5–5.1)
Sodium: 140 mmol/L (ref 135–145)
Total Bilirubin: 0.2 mg/dL (ref 0.0–1.2)
Total Protein: 6.6 g/dL (ref 6.5–8.1)

## 2024-05-08 LAB — CBC
HCT: 26.5 % — ABNORMAL LOW (ref 39.0–52.0)
Hemoglobin: 8.2 g/dL — ABNORMAL LOW (ref 13.0–17.0)
MCH: 27.5 pg (ref 26.0–34.0)
MCHC: 30.9 g/dL (ref 30.0–36.0)
MCV: 88.9 fL (ref 80.0–100.0)
Platelets: 279 K/uL (ref 150–400)
RBC: 2.98 MIL/uL — ABNORMAL LOW (ref 4.22–5.81)
RDW: 16.3 % — ABNORMAL HIGH (ref 11.5–15.5)
WBC: 5.9 K/uL (ref 4.0–10.5)
nRBC: 0 % (ref 0.0–0.2)

## 2024-05-08 LAB — BPAM RBC
Blood Product Expiration Date: 202601272359
ISSUE DATE / TIME: 202512281128
Unit Type and Rh: 9500

## 2024-05-08 LAB — TYPE AND SCREEN
ABO/RH(D): O NEG
Antibody Screen: NEGATIVE
Unit division: 0

## 2024-05-08 LAB — GLUCOSE, CAPILLARY
Glucose-Capillary: 114 mg/dL — ABNORMAL HIGH (ref 70–99)
Glucose-Capillary: 144 mg/dL — ABNORMAL HIGH (ref 70–99)
Glucose-Capillary: 187 mg/dL — ABNORMAL HIGH (ref 70–99)

## 2024-05-08 NOTE — Consult Note (Signed)
 [facility] Consult  Referring Provider: No ref. provider found Primary Care Physician:  Patient, No Pcp Per Primary Gastroenterologist: [facility]  Reason for Consultation: Melena, anemia, history of Crohn's disease  SUBJECTIVE:   HPI: Benjamin Chapman is a 73 y.o. male with past medical history significant for ileocolonic Crohn's disease diagnosed in 2018 status post total abdominal colectomy and proctectomy, fistula tract resection 03/2023, perineal wound incision and drainage August 2025.  Anti-TNF refractory disease, on Skyrizi and methotrexate 15 mg/week (last dose 04/30/2024).  No prior EGD.  Last seen by [facility] 01/19/2024 and in clinical remission on current therapy.  Noted having left sided tooth abscess in recent past with use of ibuprofen, has noted melena via ostomy as well.  Has been experiencing epigastric abdominal pain.  More recently, had significant fatigue, shaking chills, confusion and presented to the emergency department.  No chest pain or shortness of breath.  No significant change in ostomy output volume.  Hemoglobin on presentation 7.1 (was 14.5 on 01/19/2024), now improved to 8.2.  Patient denied current melena, output is now green in color.  Incidentally found to be influenza A positive.  Past Medical History:  Diagnosis Date   Bilateral carotid bruits    Diabetes mellitus without complication (HCC)    Dyspnea    Hypertension    Psoriatic arthritis (HCC)    Systolic murmur of aorta    Ulcerative colitis, acute (HCC)    Past Surgical History:  Procedure Laterality Date   COLONOSCOPY WITH PROPOFOL  N/A 01/09/2019   Procedure: COLONOSCOPY WITH PROPOFOL ;  Surgeon: Gaylyn Gladis PENNER, MD;  Location: [facility] ENDOSCOPY;  Service: Endoscopy;  Laterality: N/A;   KNEE ARTHROSCOPY Right    MELANOMA EXCISION N/A 1992   back   SHOULDER ARTHROSCOPY     bi-cept   Prior to Admission medications  Medication Sig Start Date End Date Taking? Authorizing Provider  acetaminophen   (TYLENOL ) 500 MG tablet Take 500-1,000 mg by mouth every 6 (six) hours as needed for mild pain or moderate pain.   Yes [provider]  amLODipine  (NORVASC ) 10 MG tablet Take 1 tablet (10 mg total) by mouth daily. PT. MUST MAKE AN APPOINTMENT IN ORDER TO RECEIVE FUTURE REFILLS. SECOND ATTEMPT. 09/22/23  Yes Ladona Heinz, MD  escitalopram  (LEXAPRO ) 5 MG tablet Take 5 mg by mouth daily.   Yes [provider]  losartan  (COZAAR ) 50 MG tablet Take 1 tablet (50 mg total) by mouth daily. PT. MUST MAKE AN APPOINTMENT IN ORDER TO RECEIVE FUTURE REFILLS. SECOND ATTEMPT. 09/22/23  Yes Ladona Heinz, MD  metFORMIN (GLUCOPHAGE) 500 MG tablet Take 1,000 mg by mouth 2 (two) times daily with a meal.   Yes [provider]  methotrexate 50 MG/2ML injection SMARTSIG:1 Milliliter(s) SUB-Q Once a Week 02/17/24  Yes [provider]  metoprolol  succinate (TOPROL -XL) 25 MG 24 hr tablet Take 1 tablet (25 mg total) by mouth daily. TAKE WITH OR IMMEDIATELY FOLLOWING A MEAL. PT. MUST MAKE AN APPOINTMENT IN ORDER TO RECEIVE FUTURE REFILLS. SECOND ATTEMPT. 09/22/23  Yes Ladona Heinz, MD  nitroGLYCERIN  (NITROSTAT ) 0.4 MG SL tablet DISSOLVE 1 TABLET UNDER THE TONGUE EVERY 5 MINUTES AS  NEEDED FOR CHEST PAIN. MAX  OF 3 TABLETS IN 15 MINUTES. CALL 911 IF PAIN PERSISTS. 02/01/20  Yes Ladona Heinz, MD  OZEMPIC, 0.25 OR 0.5 MG/DOSE, 2 MG/3ML SOPN INJECT 0.5 MG SUBCUTANEOUSLY EVERY WEEK   Yes [provider]  rosuvastatin  (CRESTOR ) 5 MG tablet Take 1 tablet (5 mg total) by mouth daily. 08/25/22  Yes Ladona Heinz, MD  traZODone  (DESYREL ) 50 MG tablet Take 50-100 mg by mouth at bedtime as needed for sleep.   Yes [provider]   Current Facility-Administered Medications  Medication Dose Route Frequency Provider Last Rate Last Admin   0.9 %  sodium chloride  infusion (Manually program via Guardrails IV Fluids)   Intravenous Once Sundil, Subrina, MD       acetaminophen  (TYLENOL ) tablet 650 mg  650 mg Oral  Q6H PRN Sundil, Subrina, MD       Or   acetaminophen  (TYLENOL ) suppository 650 mg  650 mg Rectal Q6H PRN Sundil, Subrina, MD       folic acid  (FOLVITE ) tablet 1 mg  1 mg Oral Daily Danford, Lonni SQUIBB, MD   1 mg at 05/07/24 1504   insulin  aspart (novoLOG ) injection 0-6 Units  0-6 Units Subcutaneous TID WC Sundil, Subrina, MD   1 Units at 05/07/24 1711   ondansetron  (ZOFRAN ) tablet 4 mg  4 mg Oral Q6H PRN Sundil, Subrina, MD       Or   ondansetron  (ZOFRAN ) injection 4 mg  4 mg Intravenous Q6H PRN Sundil, Subrina, MD       oseltamivir  (TAMIFLU ) capsule 30 mg  30 mg Oral BID Sundil, Subrina, MD   30 mg at 05/07/24 2114   pantoprazole  (PROTONIX ) injection 40 mg  40 mg Intravenous Q12H Jonel Lonni SQUIBB, MD   40 mg at 05/07/24 2114   rosuvastatin  (CRESTOR ) tablet 5 mg  5 mg Oral Daily Sundil, Subrina, MD   5 mg at 05/07/24 1251   sodium chloride  flush (NS) 0.9 % injection 3 mL  3 mL Intravenous Q12H Sundil, Subrina, MD   3 mL at 05/07/24 2120   sodium chloride  flush (NS) 0.9 % injection 3 mL  3 mL Intravenous Q12H Sundil, Subrina, MD   3 mL at 05/07/24 2120   sodium chloride  flush (NS) 0.9 % injection 3 mL  3 mL Intravenous PRN Sundil, Subrina, MD       Allergies as of 05/06/2024 - Review Complete 05/06/2024  Allergen Reaction Noted   Codeine Other (See Comments) 11/19/2015   Escitalopram   10/10/2020   Infliximab  Other (See Comments) 11/19/2015   Other Other (See Comments) 07/12/2020   Sertraline Nausea And Vomiting 09/18/2021   Family History  Problem Relation Age of Onset   Alzheimer's disease Mother    Stroke Mother    Diabetes Mother    Social History   Socioeconomic History   Marital status: Married    Spouse name: Not on file   Number of children: 2   Years of education: Not on file   Highest education level: Not on file  Occupational History   Not on file  Tobacco Use   Smoking status: Former    Current packs/day: 0.00    Average packs/day: 1 pack/day for 20.0  years (20.0 ttl pk-yrs)    Types: Cigarettes    Start date: 07/15/1978    Quit date: 07/15/1998    Years since quitting: 25.8   Smokeless tobacco: Never  Vaping Use   Vaping status: Never Used  Substance and Sexual Activity   Alcohol use: Yes    Comment: occasional   Drug use: Never   Sexual activity: Never  Other Topics Concern   Not on file  Social History Narrative   Not on file   Social Drivers of Health   Tobacco Use: Medium Risk (05/07/2024)   Patient History    Smoking Tobacco Use:  Former    Smokeless Tobacco Use: Never    Passive Exposure: Not on Actuary Strain: Low Risk (11/05/2021)   Received from [facility]   Overall Financial Resource Strain (CARDIA)    Difficulty of Paying Living Expenses: Not very hard  Food Insecurity: No Food Insecurity (05/07/2024)   Epic    Worried About Programme Researcher, Broadcasting/film/video in the Last Year: Never true    Ran Out of Food in the Last Year: Never true  Transportation Needs: No Transportation Needs (05/07/2024)   Epic    Lack of Transportation (Medical): No    Lack of Transportation (Non-Medical): No  Physical Activity: Not on file  Stress: Not on file  Social Connections: Moderately Integrated (05/07/2024)   Social Connection and Isolation Panel    Frequency of Communication with Friends and Family: More than three times a week    Frequency of Social Gatherings with Friends and Family: Three times a week    Attends Religious Services: More than 4 times per year    Active Member of Clubs or Organizations: No    Attends Banker Meetings: Never    Marital Status: Married  Catering Manager Violence: Not At Risk (05/07/2024)   Epic    Fear of Current or Ex-Partner: No    Emotionally Abused: No    Physically Abused: No    Sexually Abused: No  Depression (PHQ2-9): Not on file  Alcohol Screen: Not on file  Housing: Unknown (05/07/2024)   Epic    Unable to Pay for Housing in the Last Year: No    Number of  Times Moved in the Last Year: Not on file    Homeless in the Last Year: No  Utilities: Not At Risk (05/07/2024)   Epic    Threatened with loss of utilities: No  Health Literacy: Not on file   Review of Systems:  Review of Systems  Constitutional:  Positive for chills and malaise/fatigue.  Respiratory:  Negative for shortness of breath.   Cardiovascular:  Negative for chest pain.  Gastrointestinal:  Positive for abdominal pain and melena. Negative for nausea and vomiting.    OBJECTIVE:   Temp:  [97.6 F (36.4 C)-98.6 F (37 C)] 98.4 F (36.9 C) (12/29 0925) Pulse Rate:  [58-72] 66 (12/29 0925) Resp:  [15-18] 18 (12/29 0925) BP: (108-152)/(50-68) 128/56 (12/29 0925) SpO2:  [90 %-98 %] 95 % (12/29 0925) Last BM Date : 05/08/24 Physical Exam Constitutional:      General: He is not in acute distress.    Appearance: He is not ill-appearing, toxic-appearing or diaphoretic.  Cardiovascular:     Rate and Rhythm: Normal rate and regular rhythm.  Pulmonary:     Effort: No respiratory distress.     Breath sounds: Normal breath sounds.  Abdominal:     General: Bowel sounds are normal. There is no distension.     Palpations: Abdomen is soft.     Tenderness: There is no abdominal tenderness. There is no guarding.     Comments: Ostomy bag appreciated  Musculoskeletal:     Right lower leg: No edema.     Left lower leg: No edema.  Skin:    General: Skin is warm and dry.     Coloration: Skin is pale.  Neurological:     Mental Status: He is alert.     Labs: Recent Labs    05/06/24 1924 05/07/24 0502 05/08/24 0451  WBC 9.9 7.5 5.9  HGB 7.8*  7.1* 8.2*  HCT 24.6* 22.5* 26.5*  PLT 330 300 279   BMET Recent Labs    05/06/24 1924 05/07/24 0502 05/08/24 0451  NA 134* 136 140  K 3.9 4.4 4.3  CL 103 105 106  CO2 19* 22 23  GLUCOSE 263* 154* 129*  BUN 14 13 8   CREATININE 1.34* 1.33* 1.26*  CALCIUM  8.7* 8.5* 8.8*   LFT Recent Labs    05/08/24 0451  PROT 6.6  ALBUMIN  3.2*  AST 37  ALT 25  ALKPHOS 76  BILITOT 0.2   PT/INR No results for input(s): LABPROT, INR in the last 72 hours.  Diagnostic imaging: DG Chest 2 View Result Date: 05/06/2024 EXAM: 2 VIEW(S) XRAY OF THE CHEST 05/06/2024 07:32:02 PM COMPARISON: Chest x-ray 03/13/2008. CLINICAL HISTORY: chest pain ; congestion FINDINGS: LUNGS AND PLEURA: No focal pulmonary opacity. No pleural effusion. No pneumothorax. HEART AND MEDIASTINUM: No acute abnormality of the cardiac and mediastinal silhouettes. BONES AND SOFT TISSUES: Multilevel thoracic degenerative changes. IMPRESSION: 1. No acute cardiopulmonary process. Electronically signed by: Greig Pique MD 05/06/2024 08:26 PM EST RP Workstation: [id number]   IMPRESSION: Melena Acute blood loss anemia Ileocolonic Crohn's disease, status post total abdominal colectomy and proctectomy  - On Skyrizi and methotrexate Psoriatic arthritis Hypertension Diabetes mellitus  PLAN: - Recommend EGD to further evaluate melena and acute blood loss anemia, rule out peptic ulcer disease in setting of NSAID use, discussed procedure with patient including risks of bleeding/infection/perforation/anesthesia, he verbalized understanding and elected to proceed - IV PPI every 12 hours - Trend H/H, transfuse for hemoglobin less than 7 - Okay for dose of methotrexate today (he was due yesterday) - Okay for diet today, n.p.o. at midnight tonight for EGD 05/09/2024, further recommendations to follow pending procedure   LOS: 1 day   Estefana Keas, DO [facility]

## 2024-05-08 NOTE — H&P (View-Only) (Signed)
 [facility] Consult  Referring Provider: No ref. provider found Primary Care Physician:  Patient, No Pcp Per Primary Gastroenterologist: [facility]  Reason for Consultation: Melena, anemia, history of Crohn's disease  SUBJECTIVE:   HPI: Benjamin Chapman is a 73 y.o. male with past medical history significant for ileocolonic Crohn's disease diagnosed in 2018 status post total abdominal colectomy and proctectomy, fistula tract resection 03/2023, perineal wound incision and drainage August 2025.  Anti-TNF refractory disease, on Skyrizi and methotrexate 15 mg/week (last dose 04/30/2024).  No prior EGD.  Last seen by [facility] 01/19/2024 and in clinical remission on current therapy.  Noted having left sided tooth abscess in recent past with use of ibuprofen, has noted melena via ostomy as well.  Has been experiencing epigastric abdominal pain.  More recently, had significant fatigue, shaking chills, confusion and presented to the emergency department.  No chest pain or shortness of breath.  No significant change in ostomy output volume.  Hemoglobin on presentation 7.1 (was 14.5 on 01/19/2024), now improved to 8.2.  Patient denied current melena, output is now green in color.  Incidentally found to be influenza A positive.  Past Medical History:  Diagnosis Date   Bilateral carotid bruits    Diabetes mellitus without complication (HCC)    Dyspnea    Hypertension    Psoriatic arthritis (HCC)    Systolic murmur of aorta    Ulcerative colitis, acute (HCC)    Past Surgical History:  Procedure Laterality Date   COLONOSCOPY WITH PROPOFOL  N/A 01/09/2019   Procedure: COLONOSCOPY WITH PROPOFOL ;  Surgeon: Gaylyn Gladis PENNER, MD;  Location: [facility] ENDOSCOPY;  Service: Endoscopy;  Laterality: N/A;   KNEE ARTHROSCOPY Right    MELANOMA EXCISION N/A 1992   back   SHOULDER ARTHROSCOPY     bi-cept   Prior to Admission medications  Medication Sig Start Date End Date Taking? Authorizing Provider  acetaminophen   (TYLENOL ) 500 MG tablet Take 500-1,000 mg by mouth every 6 (six) hours as needed for mild pain or moderate pain.   Yes [provider]  amLODipine  (NORVASC ) 10 MG tablet Take 1 tablet (10 mg total) by mouth daily. PT. MUST MAKE AN APPOINTMENT IN ORDER TO RECEIVE FUTURE REFILLS. SECOND ATTEMPT. 09/22/23  Yes Ladona Heinz, MD  escitalopram  (LEXAPRO ) 5 MG tablet Take 5 mg by mouth daily.   Yes [provider]  losartan  (COZAAR ) 50 MG tablet Take 1 tablet (50 mg total) by mouth daily. PT. MUST MAKE AN APPOINTMENT IN ORDER TO RECEIVE FUTURE REFILLS. SECOND ATTEMPT. 09/22/23  Yes Ladona Heinz, MD  metFORMIN (GLUCOPHAGE) 500 MG tablet Take 1,000 mg by mouth 2 (two) times daily with a meal.   Yes [provider]  methotrexate 50 MG/2ML injection SMARTSIG:1 Milliliter(s) SUB-Q Once a Week 02/17/24  Yes [provider]  metoprolol  succinate (TOPROL -XL) 25 MG 24 hr tablet Take 1 tablet (25 mg total) by mouth daily. TAKE WITH OR IMMEDIATELY FOLLOWING A MEAL. PT. MUST MAKE AN APPOINTMENT IN ORDER TO RECEIVE FUTURE REFILLS. SECOND ATTEMPT. 09/22/23  Yes Ladona Heinz, MD  nitroGLYCERIN  (NITROSTAT ) 0.4 MG SL tablet DISSOLVE 1 TABLET UNDER THE TONGUE EVERY 5 MINUTES AS  NEEDED FOR CHEST PAIN. MAX  OF 3 TABLETS IN 15 MINUTES. CALL 911 IF PAIN PERSISTS. 02/01/20  Yes Ladona Heinz, MD  OZEMPIC, 0.25 OR 0.5 MG/DOSE, 2 MG/3ML SOPN INJECT 0.5 MG SUBCUTANEOUSLY EVERY WEEK   Yes [provider]  rosuvastatin  (CRESTOR ) 5 MG tablet Take 1 tablet (5 mg total) by mouth daily. 08/25/22  Yes Ladona Heinz, MD  traZODone  (DESYREL ) 50 MG tablet Take 50-100 mg by mouth at bedtime as needed for sleep.   Yes [provider]   Current Facility-Administered Medications  Medication Dose Route Frequency Provider Last Rate Last Admin   0.9 %  sodium chloride  infusion (Manually program via Guardrails IV Fluids)   Intravenous Once Sundil, Subrina, MD       acetaminophen  (TYLENOL ) tablet 650 mg  650 mg Oral  Q6H PRN Sundil, Subrina, MD       Or   acetaminophen  (TYLENOL ) suppository 650 mg  650 mg Rectal Q6H PRN Sundil, Subrina, MD       folic acid  (FOLVITE ) tablet 1 mg  1 mg Oral Daily Danford, Lonni SQUIBB, MD   1 mg at 05/07/24 1504   insulin  aspart (novoLOG ) injection 0-6 Units  0-6 Units Subcutaneous TID WC Sundil, Subrina, MD   1 Units at 05/07/24 1711   ondansetron  (ZOFRAN ) tablet 4 mg  4 mg Oral Q6H PRN Sundil, Subrina, MD       Or   ondansetron  (ZOFRAN ) injection 4 mg  4 mg Intravenous Q6H PRN Sundil, Subrina, MD       oseltamivir  (TAMIFLU ) capsule 30 mg  30 mg Oral BID Sundil, Subrina, MD   30 mg at 05/07/24 2114   pantoprazole  (PROTONIX ) injection 40 mg  40 mg Intravenous Q12H Jonel Lonni SQUIBB, MD   40 mg at 05/07/24 2114   rosuvastatin  (CRESTOR ) tablet 5 mg  5 mg Oral Daily Sundil, Subrina, MD   5 mg at 05/07/24 1251   sodium chloride  flush (NS) 0.9 % injection 3 mL  3 mL Intravenous Q12H Sundil, Subrina, MD   3 mL at 05/07/24 2120   sodium chloride  flush (NS) 0.9 % injection 3 mL  3 mL Intravenous Q12H Sundil, Subrina, MD   3 mL at 05/07/24 2120   sodium chloride  flush (NS) 0.9 % injection 3 mL  3 mL Intravenous PRN Sundil, Subrina, MD       Allergies as of 05/06/2024 - Review Complete 05/06/2024  Allergen Reaction Noted   Codeine Other (See Comments) 11/19/2015   Escitalopram   10/10/2020   Infliximab  Other (See Comments) 11/19/2015   Other Other (See Comments) 07/12/2020   Sertraline Nausea And Vomiting 09/18/2021   Family History  Problem Relation Age of Onset   Alzheimer's disease Mother    Stroke Mother    Diabetes Mother    Social History   Socioeconomic History   Marital status: Married    Spouse name: Not on file   Number of children: 2   Years of education: Not on file   Highest education level: Not on file  Occupational History   Not on file  Tobacco Use   Smoking status: Former    Current packs/day: 0.00    Average packs/day: 1 pack/day for 20.0  years (20.0 ttl pk-yrs)    Types: Cigarettes    Start date: 07/15/1978    Quit date: 07/15/1998    Years since quitting: 25.8   Smokeless tobacco: Never  Vaping Use   Vaping status: Never Used  Substance and Sexual Activity   Alcohol use: Yes    Comment: occasional   Drug use: Never   Sexual activity: Never  Other Topics Concern   Not on file  Social History Narrative   Not on file   Social Drivers of Health   Tobacco Use: Medium Risk (05/07/2024)   Patient History    Smoking Tobacco Use:  Former    Smokeless Tobacco Use: Never    Passive Exposure: Not on Actuary Strain: Low Risk (11/05/2021)   Received from [facility]   Overall Financial Resource Strain (CARDIA)    Difficulty of Paying Living Expenses: Not very hard  Food Insecurity: No Food Insecurity (05/07/2024)   Epic    Worried About Programme Researcher, Broadcasting/film/video in the Last Year: Never true    Ran Out of Food in the Last Year: Never true  Transportation Needs: No Transportation Needs (05/07/2024)   Epic    Lack of Transportation (Medical): No    Lack of Transportation (Non-Medical): No  Physical Activity: Not on file  Stress: Not on file  Social Connections: Moderately Integrated (05/07/2024)   Social Connection and Isolation Panel    Frequency of Communication with Friends and Family: More than three times a week    Frequency of Social Gatherings with Friends and Family: Three times a week    Attends Religious Services: More than 4 times per year    Active Member of Clubs or Organizations: No    Attends Banker Meetings: Never    Marital Status: Married  Catering Manager Violence: Not At Risk (05/07/2024)   Epic    Fear of Current or Ex-Partner: No    Emotionally Abused: No    Physically Abused: No    Sexually Abused: No  Depression (PHQ2-9): Not on file  Alcohol Screen: Not on file  Housing: Unknown (05/07/2024)   Epic    Unable to Pay for Housing in the Last Year: No    Number of  Times Moved in the Last Year: Not on file    Homeless in the Last Year: No  Utilities: Not At Risk (05/07/2024)   Epic    Threatened with loss of utilities: No  Health Literacy: Not on file   Review of Systems:  Review of Systems  Constitutional:  Positive for chills and malaise/fatigue.  Respiratory:  Negative for shortness of breath.   Cardiovascular:  Negative for chest pain.  Gastrointestinal:  Positive for abdominal pain and melena. Negative for nausea and vomiting.    OBJECTIVE:   Temp:  [97.6 F (36.4 C)-98.6 F (37 C)] 98.4 F (36.9 C) (12/29 0925) Pulse Rate:  [58-72] 66 (12/29 0925) Resp:  [15-18] 18 (12/29 0925) BP: (108-152)/(50-68) 128/56 (12/29 0925) SpO2:  [90 %-98 %] 95 % (12/29 0925) Last BM Date : 05/08/24 Physical Exam Constitutional:      General: He is not in acute distress.    Appearance: He is not ill-appearing, toxic-appearing or diaphoretic.  Cardiovascular:     Rate and Rhythm: Normal rate and regular rhythm.  Pulmonary:     Effort: No respiratory distress.     Breath sounds: Normal breath sounds.  Abdominal:     General: Bowel sounds are normal. There is no distension.     Palpations: Abdomen is soft.     Tenderness: There is no abdominal tenderness. There is no guarding.     Comments: Ostomy bag appreciated  Musculoskeletal:     Right lower leg: No edema.     Left lower leg: No edema.  Skin:    General: Skin is warm and dry.     Coloration: Skin is pale.  Neurological:     Mental Status: He is alert.     Labs: Recent Labs    05/06/24 1924 05/07/24 0502 05/08/24 0451  WBC 9.9 7.5 5.9  HGB 7.8*  7.1* 8.2*  HCT 24.6* 22.5* 26.5*  PLT 330 300 279   BMET Recent Labs    05/06/24 1924 05/07/24 0502 05/08/24 0451  NA 134* 136 140  K 3.9 4.4 4.3  CL 103 105 106  CO2 19* 22 23  GLUCOSE 263* 154* 129*  BUN 14 13 8   CREATININE 1.34* 1.33* 1.26*  CALCIUM  8.7* 8.5* 8.8*   LFT Recent Labs    05/08/24 0451  PROT 6.6  ALBUMIN  3.2*  AST 37  ALT 25  ALKPHOS 76  BILITOT 0.2   PT/INR No results for input(s): LABPROT, INR in the last 72 hours.  Diagnostic imaging: DG Chest 2 View Result Date: 05/06/2024 EXAM: 2 VIEW(S) XRAY OF THE CHEST 05/06/2024 07:32:02 PM COMPARISON: Chest x-ray 03/13/2008. CLINICAL HISTORY: chest pain ; congestion FINDINGS: LUNGS AND PLEURA: No focal pulmonary opacity. No pleural effusion. No pneumothorax. HEART AND MEDIASTINUM: No acute abnormality of the cardiac and mediastinal silhouettes. BONES AND SOFT TISSUES: Multilevel thoracic degenerative changes. IMPRESSION: 1. No acute cardiopulmonary process. Electronically signed by: Greig Pique MD 05/06/2024 08:26 PM EST RP Workstation: [id number]   IMPRESSION: Melena Acute blood loss anemia Ileocolonic Crohn's disease, status post total abdominal colectomy and proctectomy  - On Skyrizi and methotrexate Psoriatic arthritis Hypertension Diabetes mellitus  PLAN: - Recommend EGD to further evaluate melena and acute blood loss anemia, rule out peptic ulcer disease in setting of NSAID use, discussed procedure with patient including risks of bleeding/infection/perforation/anesthesia, he verbalized understanding and elected to proceed - IV PPI every 12 hours - Trend H/H, transfuse for hemoglobin less than 7 - Okay for dose of methotrexate today (he was due yesterday) - Okay for diet today, n.p.o. at midnight tonight for EGD 05/09/2024, further recommendations to follow pending procedure   LOS: 1 day   Estefana Keas, DO [facility]

## 2024-05-08 NOTE — Progress Notes (Addendum)
" °  Progress Note   Patient: Benjamin Chapman FMW:[id number] DOB: 1951/04/26 DOA: 05/06/2024     1 DOS: the patient was seen and examined on 05/08/2024 at 11:45 AM      Brief hospital course: 73 y.o. M with ileocolonic Crohn's disease, history of total abdominal colectomy end ileostomy, persistent rectal discharge status post robotic proctectomy and APR at [facility] in November 2024 by Dr. Unice, follows with [facility] gastroenterology, with plan for restaging ileoscopy in February or March, high output ostomy, previous history of small bowel obstruction in setting of Crohn's disease, also obesity, DM, HLD who presented with weakness and an altered episode.  On further questioning, reported to have melena and found to have hemoglobin 7.8.  His hemoglobin downtrended to 7.1, status post 1 unit of PRBCs.  Hemoglobin today is at 8.2.  GI consulted, they plan for endoscopy tomorrow morning.     Assessment and Plan: Acute blood loss anemia Melena Status post transfusion of 1 unit of PRBCs, hemoglobin stable at 8. Plan for endoscopy tomorrow.  GI following Continue Protonix  IV iron . Transfuse if hemoglobin is less than 7.  Acute metabolic encephalopathy Resolved   Influenza A Asymptomatic  -No acute findings on CXR - Tamiflu   Crohn's disease - Received dose of  methotrexate per GI reccs  Iron  deficiency Iron  saturation 4% - IV iron  - Oral iron  at discharge folate  Folate deficiency Incidental finding in setting of high output ostomy - Start oral supplement  Class I obesity -Hold home Ozempic  Type 2 diabetes Glucose controlled - Continue sliding scale corrections - Hold metformin  Hyperlipidemia -Continue Crestor   Hypertension Blood pressure low normal - Hold amlodipine  and losartan  until hemodynamics are clear        Subjective: Patient seen at bedside this morning, has no acute complaints.  Denies melena, hematochezia.  Denies fatigue. Awaits eval by  GI.     Physical Exam: BP 134/60 (BP Location: Right Arm)   Pulse (!) 58   Temp 98.2 F (36.8 C) (Oral)   Resp 16   Ht 5' 7 (1.702 m)   Wt 93.9 kg   SpO2 95%   BMI 32.42 kg/m   The patient was seen and examined General  No acute Distress Eyes: PERRL, lids and conjunctivae normal ENMT: Mucous membranes are moist.   Neck: normal, supple, no masses, no thyromegaly Respiratory: clear to auscultation bilaterally, no wheezing, no crackles. Normal respiratory effort. No accessory muscle use.  Cardiovascular: Regular rate and rhythm, no murmurs / rubs / gallops Abdomen: +Ostomy, Soft, nontender nondistended. Musculoskeletal: no clubbing / cyanosis. No joint deformity upper and lower extremities.  Skin: no rashes, lesions, ulcers. No induration Neurologic: Facial asymmetry, moving extremity spontaneously, speech fluent. Psychiatric: Normal judgment and insight. Alert and oriented x 3. Normal mood.   Data Reviewed: Discussed with oncology Basic metabolic panel shows stable renal function CBC shows worsening anemia with iron  deficiency    Family Communication: Updated patient at bedside    Disposition: Status is: Inpatient Patient was admitted with acute anemia, weakness, reported melena For endoscopy tomorrow       Author: Landon FORBES Baller, MD 05/08/2024 8:04 AM  For on call review [email or web address]    "

## 2024-05-08 NOTE — Progress Notes (Signed)
" ° ° °  PROCEDURAL EXPEDITER PROGRESS NOTE  Patient Name: Benjamin Chapman  DOB:July 23, 1950 Date of Admission: 05/06/2024  Date of Assessment:05/08/2024   -------------------------------------------------------------------------------------------------------------------   Brief clinical summary: 73 yr old male with Hx of Crohn's disease and 2018, status post total abd colectomy and proctectomy, fistula tract resection on 11/24.  Having EGD on 05/09/2024 for anemia and melena.   Orders in place:  Yes   Communication with surgical team if no orders: n/a  Labs, test, and orders reviewed: yes  Requires surgical clearance: n/a Yes  What type of clearance: n/a  Clearance received: n/a  Barriers noted:n/a   Intervention provided by [facility] team: n/a  Barrier resolved:  not applicable   -------------------------------------------------------------------------------------------------------------------  [employer], Ronal DELENA Bald Please contact us  directly via secure chat (search for [facility]) or by calling us  at [phone] [facility]).  "

## 2024-05-08 NOTE — Plan of Care (Signed)
   Problem: Coping: Goal: Ability to adjust to condition or change in health will improve Outcome: Progressing   Problem: Fluid Volume: Goal: Ability to maintain a balanced intake and output will improve Outcome: Progressing   Problem: Health Behavior/Discharge Planning: Goal: Ability to identify and utilize available resources and services will improve Outcome: Progressing

## 2024-05-08 NOTE — Anesthesia Preprocedure Evaluation (Addendum)
 "                                  Anesthesia Evaluation  Patient identified by MRN, date of birth, ID band Patient awake    Reviewed: Allergy & Precautions, NPO status , Patient's Chart, lab work & pertinent test results  Airway Mallampati: II  TM Distance: >3 FB Neck ROM: Full    Dental no notable dental hx. (+) Teeth Intact, Dental Advisory Given   Pulmonary former smoker Amitted yesterday w flu   Pulmonary exam normal breath sounds clear to auscultation       Cardiovascular hypertension, (-) angina (-) Past MI Normal cardiovascular exam+ Valvular Problems/Murmurs (mild AS) AS  Rhythm:Regular Rate:Normal     Neuro/Psych  PSYCHIATRIC DISORDERS  Depression       GI/Hepatic PUD (on pantoprazole ),,,Crohns dz   Endo/Other  diabetes, Type 2  On GLP-1  Renal/GU      Musculoskeletal  (+) Arthritis ,    Abdominal   Peds  Hematology  (+) Blood dyscrasia, anemia Lab Results      Component                Value               Date                      WBC                      5.9                 05/08/2024                HGB                      8.2 (L)             05/08/2024                HCT                      26.5 (L)            05/08/2024                MCV                      88.9                05/08/2024                PLT                      279                 05/08/2024              Anesthesia Other Findings   Reproductive/Obstetrics                              Anesthesia Physical Anesthesia Plan  ASA: 4  Anesthesia Plan: MAC   Post-op Pain Management: Minimal or no pain anticipated   Induction:   PONV Risk Score and Plan: 1 and Propofol  infusion and Treatment may vary due to age or medical condition  Airway Management  Planned: Natural Airway and Nasal Cannula  Additional Equipment: None  Intra-op Plan:   Post-operative Plan:   Informed Consent: I have reviewed the patients History and Physical,  chart, labs and discussed the procedure including the risks, benefits and alternatives for the proposed anesthesia with the patient or authorized representative who has indicated his/her understanding and acceptance.       Plan Discussed with:   Anesthesia Plan Comments: (PT w Anemia and Melena for EGD)         Anesthesia Quick Evaluation  "

## 2024-05-08 NOTE — Plan of Care (Signed)

## 2024-05-08 NOTE — Progress Notes (Signed)
" °   05/08/24 1027  TOC Brief Assessment  Insurance and Status Reviewed  Patient has primary care physician No (No PCP on file, patient has Managed Medicare Plan)  Home environment has been reviewed resides in private residence  Prior level of function: Independent  Prior/Current Home Services No current home services  Social Drivers of Health Review SDOH reviewed no interventions necessary  Readmission risk has been reviewed Yes  Transition of care needs no transition of care needs at this time    "

## 2024-05-08 NOTE — Progress Notes (Signed)
 PT Cancellation Note  Patient Details Name: Benjamin Chapman MRN: [id number] DOB: [birth year]   Cancelled Treatment:    Reason Eval/Treat Not Completed: PT screened, no needs identified, will sign off. Pt independent per discussion with OT.   TRUDY SAWYER 05/08/2024, 11:54 AM

## 2024-05-08 NOTE — Progress Notes (Signed)
 OT Cancellation Note  Patient Details Name: Benjamin Chapman MRN: [id number] DOB: 1951-02-02   Cancelled Treatment:    Reason Eval/Treat Not Completed: OT screened, no needs identified, will sign off Pt showered self this morning, reports feeling good and at baseline for ADLs/mobility. No formal OT eval needed at this time.   Mliss Fish 05/08/2024, 9:10 AM

## 2024-05-09 ENCOUNTER — Encounter (HOSPITAL_COMMUNITY): Admission: EM | Disposition: A | Payer: Self-pay | Source: Home / Self Care | Attending: Internal Medicine

## 2024-05-09 ENCOUNTER — Encounter (HOSPITAL_COMMUNITY): Admitting: Anesthesiology

## 2024-05-09 ENCOUNTER — Inpatient Hospital Stay (HOSPITAL_COMMUNITY)

## 2024-05-09 ENCOUNTER — Encounter (HOSPITAL_COMMUNITY): Payer: Self-pay | Admitting: Internal Medicine

## 2024-05-09 ENCOUNTER — Inpatient Hospital Stay (HOSPITAL_COMMUNITY): Admitting: Anesthesiology

## 2024-05-09 DIAGNOSIS — K259 Gastric ulcer, unspecified as acute or chronic, without hemorrhage or perforation: Secondary | ICD-10-CM

## 2024-05-09 DIAGNOSIS — D649 Anemia, unspecified: Secondary | ICD-10-CM | POA: Diagnosis not present

## 2024-05-09 DIAGNOSIS — I10 Essential (primary) hypertension: Secondary | ICD-10-CM

## 2024-05-09 DIAGNOSIS — Z87891 Personal history of nicotine dependence: Secondary | ICD-10-CM

## 2024-05-09 DIAGNOSIS — E119 Type 2 diabetes mellitus without complications: Secondary | ICD-10-CM

## 2024-05-09 HISTORY — PX: BIOPSY OF SKIN SUBCUTANEOUS TISSUE AND/OR MUCOUS MEMBRANE: SHX6741

## 2024-05-09 HISTORY — PX: ESOPHAGOGASTRODUODENOSCOPY: SHX5428

## 2024-05-09 LAB — COMPREHENSIVE METABOLIC PANEL WITH GFR
ALT: 20 U/L (ref 0–44)
AST: 28 U/L (ref 15–41)
Albumin: 3.7 g/dL (ref 3.5–5.0)
Alkaline Phosphatase: 89 U/L (ref 38–126)
Anion gap: 14 (ref 5–15)
BUN: 10 mg/dL (ref 8–23)
CO2: 22 mmol/L (ref 22–32)
Calcium: 9.5 mg/dL (ref 8.9–10.3)
Chloride: 103 mmol/L (ref 98–111)
Creatinine, Ser: 1.24 mg/dL (ref 0.61–1.24)
GFR, Estimated: >60 mL/min
Glucose, Bld: 135 mg/dL — ABNORMAL HIGH (ref 70–99)
Potassium: 4.1 mmol/L (ref 3.5–5.1)
Sodium: 139 mmol/L (ref 135–145)
Total Bilirubin: 0.3 mg/dL (ref 0.0–1.2)
Total Protein: 7.5 g/dL (ref 6.5–8.1)

## 2024-05-09 LAB — CBC
HCT: 29.1 % — ABNORMAL LOW (ref 39.0–52.0)
Hemoglobin: 9.1 g/dL — ABNORMAL LOW (ref 13.0–17.0)
MCH: 27.1 pg (ref 26.0–34.0)
MCHC: 31.3 g/dL (ref 30.0–36.0)
MCV: 86.6 fL (ref 80.0–100.0)
Platelets: 301 K/uL (ref 150–400)
RBC: 3.36 MIL/uL — ABNORMAL LOW (ref 4.22–5.81)
RDW: 15.9 % — ABNORMAL HIGH (ref 11.5–15.5)
WBC: 5.4 K/uL (ref 4.0–10.5)
nRBC: 0 % (ref 0.0–0.2)

## 2024-05-09 LAB — GLUCOSE, CAPILLARY
Glucose-Capillary: 143 mg/dL — ABNORMAL HIGH (ref 70–99)
Glucose-Capillary: 120 mg/dL — ABNORMAL HIGH (ref 70–99)
Glucose-Capillary: 149 mg/dL — ABNORMAL HIGH (ref 70–99)
Glucose-Capillary: 153 mg/dL — ABNORMAL HIGH (ref 70–99)

## 2024-05-09 MED ORDER — LIDOCAINE HCL (CARDIAC) PF 100 MG/5ML IV SOSY
PREFILLED_SYRINGE | INTRAVENOUS | Status: DC | PRN
  Administered 2024-05-09: 50 mg via INTRAVENOUS

## 2024-05-09 MED ORDER — SUCRALFATE 1 GM/10ML PO SUSP
1.0000 g | Freq: Three times a day (TID) | ORAL | Status: DC
  Administered 2024-05-09 – 2024-05-10 (×3): 1 g via ORAL
  Filled 2024-05-09 (×3): qty 10

## 2024-05-09 MED ORDER — PROPOFOL 1000 MG/100ML IV EMUL
INTRAVENOUS | Status: AC
  Filled 2024-05-09: qty 100

## 2024-05-09 MED ORDER — SODIUM CHLORIDE 0.9 % IV SOLN: INTRAVENOUS | Status: DC | PRN

## 2024-05-09 MED ORDER — PROPOFOL 500 MG/50ML IV EMUL
INTRAVENOUS | Status: DC | PRN
  Administered 2024-05-09: 220 mg via INTRAVENOUS

## 2024-05-09 NOTE — Plan of Care (Signed)
   Problem: Coping: Goal: Ability to adjust to condition or change in health will improve Outcome: Progressing   Problem: Fluid Volume: Goal: Ability to maintain a balanced intake and output will improve Outcome: Progressing   Problem: Health Behavior/Discharge Planning: Goal: Ability to identify and utilize available resources and services will improve Outcome: Progressing

## 2024-05-09 NOTE — Transfer of Care (Signed)
 Immediate Anesthesia Transfer of Care Note  Patient: Benjamin Chapman  Procedure(s) Performed: EGD (ESOPHAGOGASTRODUODENOSCOPY) BIOPSY, SKIN, SUBCUTANEOUS TISSUE, OR MUCOUS MEMBRANE  Patient Location: PACU and Endoscopy Unit  Anesthesia Type:MAC  Level of Consciousness: awake and alert   Airway & Oxygen Therapy: Patient Spontanous Breathing and Patient connected to face mask oxygen  Post-op Assessment: Report given to RN and Post -op Vital signs reviewed and stable  Post vital signs: Reviewed and stable  Last Vitals:  Vitals Value Taken Time  BP 121/58   Temp 98.1   Pulse 60   Resp 16   SpO2 99     Last Pain:  Vitals:   05/09/24 1122  TempSrc: Temporal  PainSc: 0-No pain         Complications: No notable events documented.

## 2024-05-09 NOTE — Progress Notes (Signed)
 " Progress Note   Patient: Benjamin Chapman FMW:[id number] DOB: [birth year] DOA: 05/06/2024     2 DOS: the patient was seen and examined on 05/09/2024 at 11:45 AM      Brief hospital course: 73 y.o. M with ileocolonic Crohn's disease, history of total abdominal colectomy end ileostomy, persistent rectal discharge status post robotic proctectomy and APR at [facility] in November 2024 by Dr. Unice, follows with [facility] gastroenterology, with plan for restaging ileoscopy in February or March, high output ostomy, previous history of small bowel obstruction in setting of Crohn's disease, also obesity, DM, HLD who presented with weakness and an altered episode.  On further questioning, reported to have melena and found to have hemoglobin 7.8.  His hemoglobin downtrended to 7.1, status post 1 unit of PRBCs.  Hemoglobin today is at 8.2.    Status post EGD- Found to have 20mm ulcer which was biopsied, per GI continue IV PPI until DC and then switch to 40mg  PO daily for 2 months   Assessment and Plan: Acute blood loss anemia Melena Status post transfusion of 1 unit of PRBCs, hemoglobin stable at 9 Status post EGD-12/30 Per GI-Return patient to hospital ward for ongoing care. Resume regular diet. Start sucralfate  1 gm PO QID x 14 days. IV PPI Q12Hr while inpatient, transition to  pantoprazole  40 mg PO daily x 2 months on discharge. Repeat upper endoscopy in 2 months to check healing. Follow up with primary gastroenterologist at [facility]   for follow up of Crohn's disease and coordinate  repeat EGD to ensure ulcer healing.  Acute metabolic encephalopathy Resolved   Influenza A Asymptomatic  -No acute findings on CXR - Tamiflu   Crohn's disease - Received dose of  methotrexate per GI reccs  Iron  deficiency Iron  saturation 4% - IV iron  - Oral iron  at discharge folate  Folate deficiency Incidental finding in setting of high output ostomy - Start oral supplement  Class I obesity -Hold home  Ozempic  Type 2 diabetes Glucose controlled - Continue sliding scale corrections - Hold metformin  Hyperlipidemia -Continue Crestor   Hypertension Blood pressure low normal - Hold amlodipine  and losartan  until hemodynamics are clear        Subjective: Patient seen at bedside this morning, has no acute complaints.  Denies melena, hematochezia.  Denies fatigue. Patient wanted to go home after EGD, he was advised to continue with his PPI IV. He could be discharged at tomorrow afternoon.     Physical Exam: BP 134/63 (BP Location: Left Arm)   Pulse (!) 53 Comment: notify to the nurse.  Temp 97.8 F (36.6 C) (Oral)   Resp 16   Ht 5' 7 (1.702 m)   Wt 93.9 kg   SpO2 95%   BMI 32.42 kg/m   The patient was seen and examined General  No acute Distress Eyes: PERRL, lids and conjunctivae normal ENMT: Mucous membranes are moist.   Neck: normal, supple, no masses, no thyromegaly Respiratory: clear to auscultation bilaterally, no wheezing, no crackles. Normal respiratory effort. No accessory muscle use.  Cardiovascular: Regular rate and rhythm, no murmurs / rubs / gallops Abdomen: +Ostomy, Soft, nontender nondistended. Musculoskeletal: no clubbing / cyanosis. No joint deformity upper and lower extremities.  Skin: no rashes, lesions, ulcers. No induration Neurologic: Facial asymmetry, moving extremity spontaneously, speech fluent. Psychiatric: Normal judgment and insight. Alert and oriented x 3. Normal mood.   Data Reviewed: CBC    Component Value Date/Time   WBC 5.4 05/09/2024 0903   RBC 3.36 (L) 05/09/2024  0903   HGB 9.1 (L) 05/09/2024 0903   HGB 15.8 11/19/2015 1222   HCT 29.1 (L) 05/09/2024 0903   HCT 44.7 11/19/2015 1222   PLT 301 05/09/2024 0903   PLT 189 11/19/2015 1222   MCV 86.6 05/09/2024 0903   MCV 81.9 11/19/2015 1222   MCH 27.1 05/09/2024 0903   MCHC 31.3 05/09/2024 0903   RDW 15.9 (H) 05/09/2024 0903   RDW 13.5 11/19/2015 1222   LYMPHSABS 0.3 (L)  05/06/2024 1924   LYMPHSABS 1.9 11/19/2015 1222   MONOABS 0.3 05/06/2024 1924   MONOABS 0.9 11/19/2015 1222   EOSABS 0.3 05/06/2024 1924   EOSABS 0.3 11/19/2015 1222   BASOSABS 0.0 05/06/2024 1924   BASOSABS 0.1 11/19/2015 1222   CMP     Component Value Date/Time   NA 139 05/09/2024 0903   NA 136 08/06/2022 1018   NA 137 11/19/2015 1222   K 4.1 05/09/2024 0903   K 3.8 11/19/2015 1222   CL 103 05/09/2024 0903   CO2 22 05/09/2024 0903   CO2 23 11/19/2015 1222   GLUCOSE 135 (H) 05/09/2024 0903   GLUCOSE 202 (H) 11/19/2015 1222   BUN 10 05/09/2024 0903   BUN 19 08/06/2022 1018   BUN 10.3 11/19/2015 1222   CREATININE 1.24 05/09/2024 0903   CREATININE 0.8 11/19/2015 1222   CALCIUM  9.5 05/09/2024 0903   CALCIUM  9.4 11/19/2015 1222   PROT 7.5 05/09/2024 0903   PROT 7.2 01/11/2019 0929   PROT 8.0 11/19/2015 1222   ALBUMIN 3.7 05/09/2024 0903   ALBUMIN 4.4 01/11/2019 0929   ALBUMIN 3.7 11/19/2015 1222   AST 28 05/09/2024 0903   AST 29 11/19/2015 1222   ALT 20 05/09/2024 0903   ALT 39 11/19/2015 1222   ALKPHOS 89 05/09/2024 0903   ALKPHOS 67 11/19/2015 1222   BILITOT 0.3 05/09/2024 0903   BILITOT <0.2 01/11/2019 0929   BILITOT 0.48 11/19/2015 1222   EGFR 92 08/06/2022 1018   GFRNONAA >60 05/09/2024 9096      Family Communication: Updated patient at bedside    Disposition: Status is: Inpatient Patient was admitted with acute anemia, weakness, reported melena EGD found 20mm ulcer-Plan to continue PPI BID for another 24 hrs and he could be discharged tomorrow        Author: Landon FORBES Baller, MD 05/09/2024 4:42 PM  For on call review [email or web address]    "

## 2024-05-09 NOTE — Op Note (Signed)
 [facility] Patient Name: Benjamin Chapman Procedure Date: 05/09/2024 MRN: [id number] Attending MD: Estefana Keas DO, DO, [id number] Date of Birth: 1950-09-07 CSN: [id number] Age: 73 Admit Type: Inpatient Procedure:                Upper GI endoscopy Indications:              Acute post hemorrhagic anemia, Melena Providers:                Estefana Keas DO, DO, Robie Breed, RN,                            Haskel Chris, Technician Referring MD:              Medicines:                See the Anesthesia note for documentation of the                            administered medications Complications:            No immediate complications. Estimated Blood Loss:     Estimated blood loss was minimal. Procedure:                Pre-Anesthesia Assessment:                           - ASA Grade Assessment: IV - A patient with severe                            systemic disease that is a constant threat to life.                           - The risks and benefits of the procedure and the                            sedation options and risks were discussed with the                            patient. All questions were answered and informed                            consent was obtained.                           After obtaining informed consent, the endoscope was                            passed under direct vision. Throughout the                            procedure, the patient's blood pressure, pulse, and                            oxygen saturations were monitored continuously. The  [id number]-[id number] ([id number]) Olympus endoscope was introduced                            through the mouth, and advanced to the second part                            of duodenum. The upper GI endoscopy was                            accomplished without difficulty. The patient                            tolerated the procedure well. Scope In: Scope Out: Findings:       The examined esophagus was normal.      One non-bleeding cratered gastric ulcer with a clean ulcer base (Forrest       Class III) was found on the lesser curvature of the gastric antrum. The       lesion was 20 mm in largest dimension. Biopsies were taken with a cold       forceps for Helicobacter pylori testing.      The examined duodenum was normal. Impression:               - Normal esophagus.                           - Non-bleeding gastric ulcer with a clean ulcer                            base (Forrest Class III). Biopsied.                           - Normal examined duodenum. Moderate Sedation:      Monitored anesthesia care provided by anesthesia department. Recommendation:           - Return patient to hospital ward for ongoing care.                           - Resume regular diet.                           - Continue present medications.                           - Start sucralfate  1 gm PO QID x 14 days.                           - IV PPI Q12Hr while inpatient, transition to                            pantoprazole  40 mg PO daily x 2 months on discharge.                           - Repeat upper endoscopy in 2 months to check  healing.                           - Follow up with primary gastroenterologist at [facility]                            for follow up of Crohn's disease and coordinate                            repeat EGD to ensure ulcer healing. Procedure Code(s):        --- Professional ---                           [phone], Esophagogastroduodenoscopy, flexible,                            transoral; with biopsy, single or multiple Diagnosis Code(s):        --- Professional ---                           K25.9, Gastric ulcer, unspecified as acute or                            chronic, without hemorrhage or perforation                           D62, Acute posthemorrhagic anemia                           K92.1, Melena (includes Hematochezia) CPT  copyright 2022 American Medical Association. All rights reserved. The codes documented in this report are preliminary and upon coder review may  be revised to meet current compliance requirements. Dr Estefana Keas, DO Estefana Keas DO, DO 05/09/2024 12:19:02 PM Number of Addenda: 0

## 2024-05-09 NOTE — Interval H&P Note (Signed)
 History and Physical Interval Note:  05/09/2024 11:56 AM  Benjamin Chapman  has presented today for surgery, with the diagnosis of Melena, anemia.  The various methods of treatment have been discussed with the patient and family. After consideration of risks, benefits and other options for treatment, the patient has consented to  Procedures: EGD (ESOPHAGOGASTRODUODENOSCOPY) (N/A) as a surgical intervention.  The patient's history has been reviewed, patient examined, no change in status, stable for surgery.  I have reviewed the patient's chart and labs.  Questions were answered to the patient's satisfaction.     Estefana VEAR Keas

## 2024-05-09 NOTE — Anesthesia Postprocedure Evaluation (Signed)
"   Anesthesia Post Note  Patient: Pharmacist, Community  Procedure(s) Performed: EGD (ESOPHAGOGASTRODUODENOSCOPY) BIOPSY, SKIN, SUBCUTANEOUS TISSUE, OR MUCOUS MEMBRANE     Patient location during evaluation: Endoscopy Anesthesia Type: MAC Level of consciousness: awake and alert Pain management: pain level controlled Vital Signs Assessment: post-procedure vital signs reviewed and stable Respiratory status: spontaneous breathing, nonlabored ventilation, respiratory function stable and patient connected to nasal cannula oxygen Cardiovascular status: stable and blood pressure returned to baseline Postop Assessment: no apparent nausea or vomiting Anesthetic complications: no   No notable events documented.  Last Vitals:  Vitals:   05/09/24 1241 05/09/24 1328  BP: 131/69 134/63  Pulse: (!) 58 (!) 53  Resp: 16 16  Temp: 36.4 C 36.6 C  SpO2: 95% 95%    Last Pain:  Vitals:   05/09/24 1328  TempSrc: Oral  PainSc:                  Garnette DELENA Gab      "

## 2024-05-10 ENCOUNTER — Other Ambulatory Visit (HOSPITAL_COMMUNITY): Payer: Self-pay

## 2024-05-10 LAB — CBC
HCT: 27 % — ABNORMAL LOW (ref 39.0–52.0)
Hemoglobin: 8.5 g/dL — ABNORMAL LOW (ref 13.0–17.0)
MCH: 27.4 pg (ref 26.0–34.0)
MCHC: 31.5 g/dL (ref 30.0–36.0)
MCV: 87.1 fL (ref 80.0–100.0)
Platelets: 260 K/uL (ref 150–400)
RBC: 3.1 MIL/uL — ABNORMAL LOW (ref 4.22–5.81)
RDW: 15.7 % — ABNORMAL HIGH (ref 11.5–15.5)
WBC: 6.3 K/uL (ref 4.0–10.5)
nRBC: 0 % (ref 0.0–0.2)

## 2024-05-10 LAB — GLUCOSE, CAPILLARY: Glucose-Capillary: 120 mg/dL — ABNORMAL HIGH (ref 70–99)

## 2024-05-10 MED ORDER — SUCRALFATE 1 G PO TABS
1.0000 g | ORAL_TABLET | Freq: Four times a day (QID) | ORAL | 0 refills | Status: AC
  Filled 2024-05-10: qty 56, 14d supply, fill #0

## 2024-05-10 MED ORDER — FOLIC ACID 1 MG PO TABS
1.0000 mg | ORAL_TABLET | Freq: Every day | ORAL | 0 refills | Status: AC
  Filled 2024-05-10: qty 90, 90d supply, fill #0

## 2024-05-10 MED ORDER — OSELTAMIVIR PHOSPHATE 30 MG PO CAPS
30.0000 mg | ORAL_CAPSULE | Freq: Two times a day (BID) | ORAL | 0 refills | Status: AC
  Filled 2024-05-10: qty 1, 1d supply, fill #0

## 2024-05-10 MED ORDER — PANTOPRAZOLE SODIUM 40 MG PO TBEC
40.0000 mg | DELAYED_RELEASE_TABLET | Freq: Two times a day (BID) | ORAL | 0 refills | Status: AC
  Filled 2024-05-10: qty 180, 90d supply, fill #0

## 2024-05-10 NOTE — Discharge Summary (Signed)
 " Physician Discharge Summary  Benjamin Chapman DOB: 23-Oct-1950 DOA: 05/06/2024  PCP: Patient, No Pcp Per  Admit date: 05/06/2024 Discharge date: 05/10/2024  Admitted From: Home Disposition: Home  Recommendations for Outpatient Follow-up:  Follow up with PCP in 1-2 weeks Please obtain CBC in one week to reassess hemoglobin level. Please follow up on the following pending results: Pathology pending from biopsy from EGD Consider initiation of oral iron  on follow-up, iron  15 although TIBC and ferritin within normal limits on anemia panel.  Home Health: No Equipment/Devices: None  Discharge Condition: Stable CODE STATUS: Full code Diet recommendation: Heart healthy consistent carbohydrate diet  History of present illness:  Benjamin Chapman is a 73 year old male with past medical history significant for Crohn's disease, history of total abdominal colectomy with end ileostomy, persistent rectal discharge s/p robotic prostatectomy and APR at [facility] November 2024 (Dr. Unice), high output ostomy, history of small bowel obstruction in the setting of Crohn disease, HTN, DM2, HLD, depression, obesity who presented to [facility] ED on 12/27 from home via EMS with complaint of fever, chills, fatigue, cough and shortness of breath.  Additionally patient reports dark tarry stools.  In the ED, temperature 98.1 F, HR 86, RR 16, BP 120/56, SpO2 1% on room air.  WBC 9.9, hemoglobin 7.8, platelet count 330.  Sodium 134, potassium 3.9, chloride 103, CO2 19, glucose 263, BUN 14, creat 1.34.  AST 32, ALT 29, total Ruben 0.3.  Influenza A PCR positive.  Influenza B PCR/RSV/Coid-19 PCR negative.  Urinalysis unrevealing.  C. difficile and GI PCR panel negative.  FOBT negative.  Chest x-ray with no acute cardiopulmonary disease process.  TRH was consulted for admission for further evaluation and management of anemia with associated melena, influenza A viral infection.  Hospital course:  Acute  metabolic encephalopathy: Resolved Patient presenting with mild confusion, likely in the setting of anemia and influenza viral infection as above.  Was placed on treatment with resolution of symptoms.  Acute blood loss anemia Gastric ulcer Patient presenting with a hemoglobin 7.8, noted dark tarry stools at home.  Patient was transfused 1 unit PRBC.  [facility] was consulted and patient underwent EGD with findings of nonbleeding gastric ulcer.  Was started on Protonix  40 mg p.o. twice daily will need to continue for 2 months with Carafate  1 g 3 times daily AC/at bedtime for 14 days.  Outpatient follow-up with [facility] gastroenterology with recommendation of repeat EGD 2 months.  Hemoglobin 8.5 at time of discharge.  Repeat CBC 1 week.  Consider starting oral iron  at follow-up as above.  Influenza A viral infection Started on Tamiflu .  Not oxygen dependent.  Crohn's disease Continue methotrexate injections weekly.  Outpatient follow-up with [facility] gastroenterology.  Folate deficiency Folic acid  3.2.  Started on folate 1 mg p.o. daily.  DM2 Hemoglobin A1c 7.5%.  Continue metformin 1000 mg p.o. twice daily  Hyperlipidemia Continue Crestor  5 mg p.o. daily  HTN Continue losartan  50 mg p.o. daily, amlodipine  10 mg p.o. daily, metoprolol  succinate 25 mg p.o. daily,  Depression  Continue Lexapro    Discharge Diagnoses:  Principal Problem:   Acute anemia unclear etiology Active Problems:   Altered mental status   AKI (acute kidney injury)   Influenza A H1N1 infection   Non-insulin  dependent type 2 diabetes mellitus (HCC)   Hyperlipidemia   History of Crohn's disease   High output ileostomy (HCC)   History of ileostomy   Essential hypertension    Discharge Instructions  Discharge Instructions  Call MD for:  difficulty breathing, headache or visual disturbances   Complete by: As directed    Call MD for:  difficulty breathing, headache or visual disturbances   Complete  by: As directed    Call MD for:  extreme fatigue   Complete by: As directed    Call MD for:  extreme fatigue   Complete by: As directed    Call MD for:  persistant dizziness or light-headedness   Complete by: As directed    Call MD for:  persistant dizziness or light-headedness   Complete by: As directed    Call MD for:  persistant nausea and vomiting   Complete by: As directed    Call MD for:  persistant nausea and vomiting   Complete by: As directed    Call MD for:  severe uncontrolled pain   Complete by: As directed    Call MD for:  severe uncontrolled pain   Complete by: As directed    Call MD for:  temperature >100.4   Complete by: As directed    Call MD for:  temperature >100.4   Complete by: As directed    Increase activity slowly   Complete by: As directed    Increase activity slowly   Complete by: As directed       Allergies as of 05/10/2024       Reactions   Codeine Other (See Comments)   Nausea   Escitalopram     Other reaction(s): fatigue, diarrhea, ED, Crohn's flareup   Infliximab  Other (See Comments)   Other Other (See Comments)   Sertraline Nausea And Vomiting        Medication List     STOP taking these medications    Ozempic (0.25 or 0.5 MG/DOSE) 2 MG/3ML Sopn Generic drug: Semaglutide(0.25 or 0.5MG /DOS)       TAKE these medications    acetaminophen  500 MG tablet Commonly known as: TYLENOL  Take 500-1,000 mg by mouth every 6 (six) hours as needed for mild pain or moderate pain.   amLODipine  10 MG tablet Commonly known as: NORVASC  Take 1 tablet (10 mg total) by mouth daily. PT. MUST MAKE AN APPOINTMENT IN ORDER TO RECEIVE FUTURE REFILLS. SECOND ATTEMPT.   escitalopram  5 MG tablet Commonly known as: LEXAPRO  Take 5 mg by mouth daily.   folic acid  1 MG tablet Commonly known as: FOLVITE  Take 1 tablet (1 mg total) by mouth daily. Start taking on: May 11, 2024   losartan  50 MG tablet Commonly known as: COZAAR  Take 1 tablet (50 mg  total) by mouth daily. PT. MUST MAKE AN APPOINTMENT IN ORDER TO RECEIVE FUTURE REFILLS. SECOND ATTEMPT.   metFORMIN 500 MG tablet Commonly known as: GLUCOPHAGE Take 1,000 mg by mouth 2 (two) times daily with a meal.   methotrexate 50 MG/2ML injection SMARTSIG:1 Milliliter(s) SUB-Q Once a Week   metoprolol  succinate 25 MG 24 hr tablet Commonly known as: TOPROL -XL Take 1 tablet (25 mg total) by mouth daily. TAKE WITH OR IMMEDIATELY FOLLOWING A MEAL. PT. MUST MAKE AN APPOINTMENT IN ORDER TO RECEIVE FUTURE REFILLS. SECOND ATTEMPT.   nitroGLYCERIN  0.4 MG SL tablet Commonly known as: NITROSTAT  DISSOLVE 1 TABLET UNDER THE TONGUE EVERY 5 MINUTES AS  NEEDED FOR CHEST PAIN. MAX  OF 3 TABLETS IN 15 MINUTES. CALL 911 IF PAIN PERSISTS.   oseltamivir  30 MG capsule Commonly known as: TAMIFLU  Take 1 capsule (30 mg total) by mouth 2 (two) times daily for 1 dose.   pantoprazole  40 MG tablet  Commonly known as: Protonix  Take 1 tablet (40 mg total) by mouth 2 (two) times daily.   rosuvastatin  5 MG tablet Commonly known as: CRESTOR  Take 1 tablet (5 mg total) by mouth daily.   sucralfate  1 g tablet Commonly known as: Carafate  Take 1 tablet (1 g total) by mouth 4 (four) times daily.   traZODone  50 MG tablet Commonly known as: DESYREL  Take 50-100 mg by mouth at bedtime as needed for sleep.        Follow-up Information     Stem, Dorn Sharper, MD. Schedule an appointment as soon as possible for a visit.   Specialty: Surgery Why: EGD in 2 months Contact information: [address] [place] [address] [phone]         Cheyenne Anette Dragon, NP. Schedule an appointment as soon as possible for a visit in 2 month(s).   Specialty: Nurse Practitioner Why: EGD in 2 months Contact information: [address] [facility] [address] [facility] Dept Medicine [place] [address] [phone]                Allergies[1]  Consultations: [facility] gastroenterology   Procedures/Studies: DG  Chest 2 View Result Date: 05/06/2024 EXAM: 2 VIEW(S) XRAY OF THE CHEST 05/06/2024 07:32:02 PM COMPARISON: Chest x-ray 03/13/2008. CLINICAL HISTORY: chest pain ; congestion FINDINGS: LUNGS AND PLEURA: No focal pulmonary opacity. No pleural effusion. No pneumothorax. HEART AND MEDIASTINUM: No acute abnormality of the cardiac and mediastinal silhouettes. BONES AND SOFT TISSUES: Multilevel thoracic degenerative changes. IMPRESSION: 1. No acute cardiopulmonary process. Electronically signed by: Greig Pique MD 05/06/2024 08:26 PM EST RP Workstation: [id number]     Subjective: Patient seen examined side, sitting in bedside chair.  Ready for discharge.  Tolerating diet.  No complaints this morning.  Denies headache, no dizziness, no chest pain, no palpitations, no shortness of breath, no abdominal pain, no fever/chills/night sweats, no nausea/vomiting/diarrhea, no focal weakness, no fatigue, no paresthesia.  No acute events overnight per nursing staff.  Discharge Exam: Vitals:   05/09/24 2036 05/10/24 0602  BP: 136/66 126/62  Pulse: 60 (!) 58  Resp: 18 18  Temp: 98.3 F (36.8 C) 97.8 F (36.6 C)  SpO2: 92% 96%   Vitals:   05/09/24 1241 05/09/24 1328 05/09/24 2036 05/10/24 0602  BP: 131/69 134/63 136/66 126/62  Pulse: (!) 58 (!) 53 60 (!) 58  Resp: 16 16 18 18   Temp: 97.6 F (36.4 C) 97.8 F (36.6 C) 98.3 F (36.8 C) 97.8 F (36.6 C)  TempSrc: Oral Oral Oral Oral  SpO2: 95% 95% 92% 96%  Weight:      Height:        Physical Exam: GEN: NAD, alert and oriented x 3, wd/wn HEENT: NCAT, PERRL, EOMI, sclera clear, MMM PULM: CTAB w/o wheezes/crackles, normal respiratory effort, on room air CV: RRR w/o M/G/R GI: abd soft, NTND, + BS MSK: no peripheral edema, muscle strength globally intact 5/5 bilateral upper/lower extremities NEURO: CN II-XII intact, no focal deficits, sensation to light touch intact PSYCH: normal mood/affect Integumentary: dry/intact, no rashes or wounds    The  results of significant diagnostics from this hospitalization (including imaging, microbiology, ancillary and laboratory) are listed below for reference.     Microbiology: Recent Results (from the past 240 hours)  Resp panel by RT-PCR (RSV, Flu A&B, Covid) Anterior Nasal Swab     Status: Abnormal   Collection Time: 05/06/24  8:31 PM   Specimen: Anterior Nasal Swab  Result Value Ref Range Status  SARS Coronavirus 2 by RT PCR NEGATIVE NEGATIVE Final    Comment: (NOTE) SARS-CoV-2 target nucleic acids are NOT DETECTED.  The SARS-CoV-2 RNA is generally detectable in upper respiratory specimens during the acute phase of infection. The lowest concentration of SARS-CoV-2 viral copies this assay can detect is 138 copies/mL. A negative result does not preclude SARS-Cov-2 infection and should not be used as the sole basis for treatment or other patient management decisions. A negative result may occur with  improper specimen collection/handling, submission of specimen other than nasopharyngeal swab, presence of viral mutation(s) within the areas targeted by this assay, and inadequate number of viral copies(<138 copies/mL). A negative result must be combined with clinical observations, patient history, and epidemiological information. The expected result is Negative.  Fact Sheet for Patients:  bloggercourse.com  Fact Sheet for Healthcare Providers:  seriousbroker.it  This test is no t yet approved or cleared by the [place]  FDA and  has been authorized for detection and/or diagnosis of SARS-CoV-2 by FDA under an Emergency Use Authorization (EUA). This EUA will remain  in effect (meaning this test can be used) for the duration of the COVID-19 declaration under Section 564(b)(1) of the Act, 21 U.S.C.section 360bbb-3(b)(1), unless the authorization is terminated  or revoked sooner.       Influenza A by PCR POSITIVE (A) NEGATIVE Final    Influenza B by PCR NEGATIVE NEGATIVE Final    Comment: (NOTE) The Xpert Xpress SARS-CoV-2/FLU/RSV plus assay is intended as an aid in the diagnosis of influenza from Nasopharyngeal swab specimens and should not be used as a sole basis for treatment. Nasal washings and aspirates are unacceptable for Xpert Xpress SARS-CoV-2/FLU/RSV testing.  Fact Sheet for Patients: bloggercourse.com  Fact Sheet for Healthcare Providers: seriousbroker.it  This test is not yet approved or cleared by the [place]  FDA and has been authorized for detection and/or diagnosis of SARS-CoV-2 by FDA under an Emergency Use Authorization (EUA). This EUA will remain in effect (meaning this test can be used) for the duration of the COVID-19 declaration under Section 564(b)(1) of the Act, 21 U.S.C. section 360bbb-3(b)(1), unless the authorization is terminated or revoked.     Resp Syncytial Virus by PCR NEGATIVE NEGATIVE Final    Comment: (NOTE) Fact Sheet for Patients: bloggercourse.com  Fact Sheet for Healthcare Providers: seriousbroker.it  This test is not yet approved or cleared by the [place]  FDA and has been authorized for detection and/or diagnosis of SARS-CoV-2 by FDA under an Emergency Use Authorization (EUA). This EUA will remain in effect (meaning this test can be used) for the duration of the COVID-19 declaration under Section 564(b)(1) of the Act, 21 U.S.C. section 360bbb-3(b)(1), unless the authorization is terminated or revoked.  Performed at [facility] [address] [place], [place] [address]   Culture, blood (routine x 2)     Status: None (Preliminary result)   Collection Time: 05/07/24 12:08 AM   Specimen: BLOOD  Result Value Ref Range Status   Specimen Description   Final    BLOOD RIGHT ANTECUBITAL Performed at [facility], [address], [place], [place] [address]    Special Requests   Final    BOTTLES DRAWN AEROBIC AND ANAEROBIC Blood Culture adequate volume Performed at [facility], [address], [place], [place] [address]    Culture   Final    NO GROWTH 3 DAYS Performed at [facility], [address], [place], [place] [address]  Report Status PENDING  Incomplete  Gastrointestinal Panel by PCR , Stool     Status: None   Collection Time: 05/07/24  3:06 AM   Specimen: Stool  Result Value Ref Range Status   Campylobacter species NOT DETECTED NOT DETECTED Final   Plesimonas shigelloides NOT DETECTED NOT DETECTED Final   Salmonella species NOT DETECTED NOT DETECTED Final   Yersinia enterocolitica NOT DETECTED NOT DETECTED Final   Vibrio species NOT DETECTED NOT DETECTED Final   Vibrio cholerae NOT DETECTED NOT DETECTED Final   Enteroaggregative E coli (EAEC) NOT DETECTED NOT DETECTED Final   Enteropathogenic E coli (EPEC) NOT DETECTED NOT DETECTED Final   Enterotoxigenic E coli (ETEC) NOT DETECTED NOT DETECTED Final   Shiga like toxin producing E coli (STEC) NOT DETECTED NOT DETECTED Final   Shigella/Enteroinvasive E coli (EIEC) NOT DETECTED NOT DETECTED Final   Cryptosporidium NOT DETECTED NOT DETECTED Final   Cyclospora cayetanensis NOT DETECTED NOT DETECTED Final   Entamoeba histolytica NOT DETECTED NOT DETECTED Final   Giardia lamblia NOT DETECTED NOT DETECTED Final   Adenovirus F40/41 NOT DETECTED NOT DETECTED Final   Astrovirus NOT DETECTED NOT DETECTED Final   Norovirus GI/GII NOT DETECTED NOT DETECTED Final   Rotavirus A NOT DETECTED NOT DETECTED Final   Sapovirus (I, II, IV, and V) NOT DETECTED NOT DETECTED Final    Comment: Performed at [facility], [address] [place], [place] [address]  C Difficile Quick Screen w PCR reflex     Status: None   Collection Time: 05/07/24  3:35 AM   Specimen: Stool  Result Value Ref Range Status   C Diff antigen  NEGATIVE NEGATIVE Final   C Diff toxin NEGATIVE NEGATIVE Final   C Diff interpretation No C. difficile detected.  Final    Comment: Performed at [facility], [address], [place], [place] [address]     Labs: BNP (last 3 results) No results for input(s): BNP in the last 8760 hours. Basic Metabolic Panel: Recent Labs  Lab 05/06/24 1924 05/07/24 0502 05/08/24 0451 05/09/24 0903  NA 134* 136 140 139  K 3.9 4.4 4.3 4.1  CL 103 105 106 103  CO2 19* 22 23 22   GLUCOSE 263* 154* 129* 135*  BUN 14 13 8 10   CREATININE 1.34* 1.33* 1.26* 1.24  CALCIUM  8.7* 8.5* 8.8* 9.5   Liver Function Tests: Recent Labs  Lab 05/06/24 1924 05/07/24 0502 05/08/24 0451 05/09/24 0903  AST 32 29 37 28  ALT 29 26 25 20   ALKPHOS 86 81 76 89  BILITOT 0.3 0.3 0.2 0.3  PROT 7.0 6.7 6.6 7.5  ALBUMIN 3.6 3.4* 3.2* 3.7   No results for input(s): LIPASE, AMYLASE in the last 168 hours. No results for input(s): AMMONIA in the last 168 hours. CBC: Recent Labs  Lab 05/06/24 1924 05/07/24 0502 05/08/24 0451 05/09/24 0903 05/10/24 0540  WBC 9.9 7.5 5.9 5.4 6.3  NEUTROABS 8.6*  --   --   --   --   HGB 7.8* 7.1* 8.2* 9.1* 8.5*  HCT 24.6* 22.5* 26.5* 29.1* 27.0*  MCV 89.5 91.5 88.9 86.6 87.1  PLT 330 300 279 301 260   Cardiac Enzymes: No results for input(s): CKTOTAL, CKMB, CKMBINDEX, TROPONINI in the last 168 hours. BNP: Invalid input(s): POCBNP CBG: Recent Labs  Lab 05/09/24 0223 05/09/24 0747 05/09/24 1659 05/09/24 2157 05/10/24 0746  GLUCAP 143* 120* 149* 153* 120*   D-Dimer No results for input(s): DDIMER in the  last 72 hours. Hgb A1c No results for input(s): HGBA1C in the last 72 hours. Lipid Profile No results for input(s): CHOL, HDL, LDLCALC, TRIG, CHOLHDL, LDLDIRECT in the last 72 hours. Thyroid  function studies No results for input(s): TSH, T4TOTAL, T3FREE, THYROIDAB in the last 72 hours.  Invalid input(s):  FREET3 Anemia work up No results for input(s): VITAMINB12, FOLATE, FERRITIN, TIBC, IRON , RETICCTPCT in the last 72 hours. Urinalysis    Component Value Date/Time   COLORURINE YELLOW 05/07/2024 0502   APPEARANCEUR CLEAR 05/07/2024 0502   LABSPEC 1.014 05/07/2024 0502   PHURINE 5.0 05/07/2024 0502   GLUCOSEU 50 (A) 05/07/2024 0502   HGBUR NEGATIVE 05/07/2024 0502   BILIRUBINUR NEGATIVE 05/07/2024 0502   KETONESUR NEGATIVE 05/07/2024 0502   PROTEINUR NEGATIVE 05/07/2024 0502   NITRITE NEGATIVE 05/07/2024 0502   LEUKOCYTESUR NEGATIVE 05/07/2024 0502   Sepsis Labs Recent Labs  Lab 05/07/24 0502 05/08/24 0451 05/09/24 0903 05/10/24 0540  WBC 7.5 5.9 5.4 6.3   Microbiology Recent Results (from the past 240 hours)  Resp panel by RT-PCR (RSV, Flu A&B, Covid) Anterior Nasal Swab     Status: Abnormal   Collection Time: 05/06/24  8:31 PM   Specimen: Anterior Nasal Swab  Result Value Ref Range Status   SARS Coronavirus 2 by RT PCR NEGATIVE NEGATIVE Final    Comment: (NOTE) SARS-CoV-2 target nucleic acids are NOT DETECTED.  The SARS-CoV-2 RNA is generally detectable in upper respiratory specimens during the acute phase of infection. The lowest concentration of SARS-CoV-2 viral copies this assay can detect is 138 copies/mL. A negative result does not preclude SARS-Cov-2 infection and should not be used as the sole basis for treatment or other patient management decisions. A negative result may occur with  improper specimen collection/handling, submission of specimen other than nasopharyngeal swab, presence of viral mutation(s) within the areas targeted by this assay, and inadequate number of viral copies(<138 copies/mL). A negative result must be combined with clinical observations, patient history, and epidemiological information. The expected result is Negative.  Fact Sheet for Patients:  bloggercourse.com  Fact Sheet for Healthcare  Providers:  seriousbroker.it  This test is no t yet approved or cleared by the [place]  FDA and  has been authorized for detection and/or diagnosis of SARS-CoV-2 by FDA under an Emergency Use Authorization (EUA). This EUA will remain  in effect (meaning this test can be used) for the duration of the COVID-19 declaration under Section 564(b)(1) of the Act, 21 U.S.C.section 360bbb-3(b)(1), unless the authorization is terminated  or revoked sooner.       Influenza A by PCR POSITIVE (A) NEGATIVE Final   Influenza B by PCR NEGATIVE NEGATIVE Final    Comment: (NOTE) The Xpert Xpress SARS-CoV-2/FLU/RSV plus assay is intended as an aid in the diagnosis of influenza from Nasopharyngeal swab specimens and should not be used as a sole basis for treatment. Nasal washings and aspirates are unacceptable for Xpert Xpress SARS-CoV-2/FLU/RSV testing.  Fact Sheet for Patients: bloggercourse.com  Fact Sheet for Healthcare Providers: seriousbroker.it  This test is not yet approved or cleared by the [place]  FDA and has been authorized for detection and/or diagnosis of SARS-CoV-2 by FDA under an Emergency Use Authorization (EUA). This EUA will remain in effect (meaning this test can be used) for the duration of the COVID-19 declaration under Section 564(b)(1) of the Act, 21 U.S.C. section 360bbb-3(b)(1), unless the authorization is terminated or revoked.     Resp Syncytial Virus by PCR NEGATIVE NEGATIVE Final  Comment: (NOTE) Fact Sheet for Patients: bloggercourse.com  Fact Sheet for Healthcare Providers: seriousbroker.it  This test is not yet approved or cleared by the [place]  FDA and has been authorized for detection and/or diagnosis of SARS-CoV-2 by FDA under an Emergency Use Authorization (EUA). This EUA will remain in effect (meaning this test  can be used) for the duration of the COVID-19 declaration under Section 564(b)(1) of the Act, 21 U.S.C. section 360bbb-3(b)(1), unless the authorization is terminated or revoked.  Performed at [facility], [address] [place], [place] [address]   Culture, blood (routine x 2)     Status: None (Preliminary result)   Collection Time: 05/07/24 12:08 AM   Specimen: BLOOD  Result Value Ref Range Status   Specimen Description   Final    BLOOD RIGHT ANTECUBITAL Performed at [facility], [address], [place], [place] [address]    Special Requests   Final    BOTTLES DRAWN AEROBIC AND ANAEROBIC Blood Culture adequate volume Performed at [facility], [address] [place], [place] [address]    Culture   Final    NO GROWTH 3 DAYS Performed at [facility], [address] [place], [place] [address]    Report Status PENDING  Incomplete  Gastrointestinal Panel by PCR , Stool     Status: None   Collection Time: 05/07/24  3:06 AM   Specimen: Stool  Result Value Ref Range Status   Campylobacter species NOT DETECTED NOT DETECTED Final   Plesimonas shigelloides NOT DETECTED NOT DETECTED Final   Salmonella species NOT DETECTED NOT DETECTED Final   Yersinia enterocolitica NOT DETECTED NOT DETECTED Final   Vibrio species NOT DETECTED NOT DETECTED Final   Vibrio cholerae NOT DETECTED NOT DETECTED Final   Enteroaggregative E coli (EAEC) NOT DETECTED NOT DETECTED Final   Enteropathogenic E coli (EPEC) NOT DETECTED NOT DETECTED Final   Enterotoxigenic E coli (ETEC) NOT DETECTED NOT DETECTED Final   Shiga like toxin producing E coli (STEC) NOT DETECTED NOT DETECTED Final   Shigella/Enteroinvasive E coli (EIEC) NOT DETECTED NOT DETECTED Final   Cryptosporidium NOT DETECTED NOT DETECTED Final   Cyclospora cayetanensis NOT DETECTED NOT DETECTED Final   Entamoeba histolytica NOT DETECTED NOT DETECTED Final   Giardia lamblia NOT  DETECTED NOT DETECTED Final   Adenovirus F40/41 NOT DETECTED NOT DETECTED Final   Astrovirus NOT DETECTED NOT DETECTED Final   Norovirus GI/GII NOT DETECTED NOT DETECTED Final   Rotavirus A NOT DETECTED NOT DETECTED Final   Sapovirus (I, II, IV, and V) NOT DETECTED NOT DETECTED Final    Comment: Performed at [facility], [address] [place], [place] [address]  C Difficile Quick Screen w PCR reflex     Status: None   Collection Time: 05/07/24  3:35 AM   Specimen: Stool  Result Value Ref Range Status   C Diff antigen NEGATIVE NEGATIVE Final   C Diff toxin NEGATIVE NEGATIVE Final   C Diff interpretation No C. difficile detected.  Final    Comment: Performed at [facility], [address], [place], [place] [address]     Time coordinating discharge: Over 30 minutes  SIGNED:   Camellia PARAS Malacki Mcphearson, DO  [facility] 05/10/2024, 9:15 AM     [1]  Allergies Allergen Reactions   Codeine Other (See Comments)    Nausea   Escitalopram      Other reaction(s): fatigue, diarrhea, ED, Crohn's flareup  Infliximab  Other (See Comments)   Other Other (See Comments)   Sertraline Nausea And Vomiting   "

## 2024-05-10 NOTE — Progress Notes (Signed)
 Pt discharge meds delivered to bedside.

## 2024-05-10 NOTE — Plan of Care (Signed)

## 2024-05-10 NOTE — Plan of Care (Signed)
" °  Problem: Education: Goal: Ability to describe self-care measures that may prevent or decrease complications (Diabetes Survival Skills Education) will improve Outcome: Progressing Goal: Individualized Educational Video(s) Outcome: Progressing   Problem: Coping: Goal: Ability to adjust to condition or change in health will improve Outcome: Progressing   Problem: Fluid Volume: Goal: Ability to maintain a balanced intake and output will improve Outcome: Progressing   Problem: Health Behavior/Discharge Planning: Goal: Ability to identify and utilize available resources and services will improve Outcome: Progressing Goal: Ability to manage health-related needs will improve Outcome: Progressing   Problem: Metabolic: Goal: Ability to maintain appropriate glucose levels will improve Outcome: Progressing   Problem: Nutritional: Goal: Maintenance of adequate nutrition will improve Outcome: Progressing Goal: Progress toward achieving an optimal weight will improve Outcome: Progressing   Problem: Skin Integrity: Goal: Risk for impaired skin integrity will decrease Outcome: Progressing   Problem: Tissue Perfusion: Goal: Adequacy of tissue perfusion will improve Outcome: Progressing   Problem: Education: Goal: Knowledge of General Education information will improve Description: Including pain rating scale, medication(s)/side effects and non-pharmacologic comfort measures Outcome: Progressing   Problem: Health Behavior/Discharge Planning: Goal: Ability to manage health-related needs will improve Outcome: Progressing   Problem: Clinical Measurements: Goal: Ability to maintain clinical measurements within normal limits will improve Outcome: Progressing Goal: Will remain free from infection Outcome: Progressing Goal: Diagnostic test results will improve Outcome: Progressing Goal: Respiratory complications will improve Outcome: Progressing Goal: Cardiovascular complication will  be avoided Outcome: Progressing   Problem: Activity: Goal: Risk for activity intolerance will decrease Outcome: Progressing   Problem: Nutrition: Goal: Adequate nutrition will be maintained Outcome: Progressing   Problem: Coping: Goal: Level of anxiety will decrease Outcome: Progressing   Problem: Elimination: Goal: Will not experience complications related to bowel motility Outcome: Progressing Goal: Will not experience complications related to urinary retention Outcome: Progressing   Problem: Pain Managment: Goal: General experience of comfort will improve and/or be controlled Outcome: Progressing   Problem: Safety: Goal: Ability to remain free from injury will improve Outcome: Progressing   Problem: Skin Integrity: Goal: Risk for impaired skin integrity will decrease Outcome: Progressing   Problem: Education: Goal: Individualized Educational Video(s) Outcome: Progressing   Problem: Coping: Goal: Ability to adjust to condition or change in health will improve Outcome: Progressing   Problem: Fluid Volume: Goal: Ability to maintain a balanced intake and output will improve Outcome: Progressing   Problem: Health Behavior/Discharge Planning: Goal: Ability to identify and utilize available resources and services will improve Outcome: Progressing   Problem: Health Behavior/Discharge Planning: Goal: Ability to manage health-related needs will improve Outcome: Progressing   Problem: Metabolic: Goal: Ability to maintain appropriate glucose levels will improve Outcome: Progressing   "

## 2024-05-11 ENCOUNTER — Encounter (HOSPITAL_COMMUNITY): Payer: Self-pay | Admitting: Internal Medicine

## 2024-05-11 LAB — SURGICAL PATHOLOGY

## 2024-05-12 LAB — CULTURE, BLOOD (ROUTINE X 2)
Culture: NO GROWTH
Culture: NO GROWTH
Special Requests: ADEQUATE

## 2024-06-08 ENCOUNTER — Other Ambulatory Visit: Payer: Self-pay

## 2024-06-08 ENCOUNTER — Inpatient Hospital Stay
Admission: RE | Admit: 2024-06-08 | Discharge: 2024-06-08 | Disposition: A | Discharge status: Home / Self Care | Payer: Self-pay | Source: Ambulatory Visit | Attending: Neurosurgery | Admitting: Neurosurgery

## 2024-06-08 DIAGNOSIS — Z049 Encounter for examination and observation for unspecified reason: Secondary | ICD-10-CM

## 2024-06-09 ENCOUNTER — Ambulatory Visit: Admitting: Neurosurgery
